# Patient Record
Sex: Female | Born: 1955
Health system: Southern US, Community
[De-identification: ages and names within clinical notes are randomized; demographics above are authoritative.]

## PROBLEM LIST (undated history)

## (undated) DIAGNOSIS — I82409 Acute embolism and thrombosis of unspecified deep veins of unspecified lower extremity: Secondary | ICD-10-CM

## (undated) DIAGNOSIS — Z87442 Personal history of urinary calculi: Secondary | ICD-10-CM

## (undated) DIAGNOSIS — E7800 Pure hypercholesterolemia, unspecified: Secondary | ICD-10-CM

## (undated) DIAGNOSIS — I10 Essential (primary) hypertension: Secondary | ICD-10-CM

## (undated) DIAGNOSIS — Z9289 Personal history of other medical treatment: Secondary | ICD-10-CM

## (undated) DIAGNOSIS — N289 Disorder of kidney and ureter, unspecified: Secondary | ICD-10-CM

## (undated) DIAGNOSIS — M1990 Unspecified osteoarthritis, unspecified site: Secondary | ICD-10-CM

## (undated) DIAGNOSIS — C801 Malignant (primary) neoplasm, unspecified: Secondary | ICD-10-CM

## (undated) DIAGNOSIS — R7303 Prediabetes: Secondary | ICD-10-CM

## (undated) DIAGNOSIS — F419 Anxiety disorder, unspecified: Secondary | ICD-10-CM

## (undated) DIAGNOSIS — J189 Pneumonia, unspecified organism: Secondary | ICD-10-CM

## (undated) DIAGNOSIS — R519 Headache, unspecified: Secondary | ICD-10-CM

## (undated) HISTORY — PX: ABDOMINAL HYSTERECTOMY: SHX81

## (undated) HISTORY — PX: BREAST LUMPECTOMY: SHX2

## (undated) HISTORY — PX: OTHER SURGICAL HISTORY: SHX169

## (undated) HISTORY — PX: BUNIONECTOMY: SHX129

---

## 2000-11-22 ENCOUNTER — Emergency Department (HOSPITAL_COMMUNITY): Admission: EM | Admit: 2000-11-22 | Discharge: 2000-11-22 | Payer: Self-pay | Admitting: Emergency Medicine

## 2001-01-10 ENCOUNTER — Encounter: Payer: Self-pay | Admitting: Family Medicine

## 2001-01-10 ENCOUNTER — Encounter: Admission: RE | Admit: 2001-01-10 | Discharge: 2001-01-10 | Payer: Self-pay | Admitting: Family Medicine

## 2001-02-10 ENCOUNTER — Ambulatory Visit (HOSPITAL_COMMUNITY): Admission: RE | Admit: 2001-02-10 | Discharge: 2001-02-10 | Payer: Self-pay | Admitting: Internal Medicine

## 2001-02-10 ENCOUNTER — Encounter: Payer: Self-pay | Admitting: Internal Medicine

## 2001-02-12 ENCOUNTER — Ambulatory Visit (HOSPITAL_COMMUNITY): Admission: RE | Admit: 2001-02-12 | Discharge: 2001-02-12 | Payer: Self-pay | Admitting: Internal Medicine

## 2001-02-12 ENCOUNTER — Encounter: Payer: Self-pay | Admitting: Internal Medicine

## 2001-02-19 ENCOUNTER — Other Ambulatory Visit: Admission: RE | Admit: 2001-02-19 | Discharge: 2001-02-19 | Payer: Self-pay | Admitting: Internal Medicine

## 2001-02-19 ENCOUNTER — Encounter (INDEPENDENT_AMBULATORY_CARE_PROVIDER_SITE_OTHER): Payer: Self-pay | Admitting: Specialist

## 2001-02-19 DIAGNOSIS — D126 Benign neoplasm of colon, unspecified: Secondary | ICD-10-CM

## 2001-02-26 ENCOUNTER — Encounter: Payer: Self-pay | Admitting: Urology

## 2001-02-26 ENCOUNTER — Encounter: Admission: RE | Admit: 2001-02-26 | Discharge: 2001-02-26 | Payer: Self-pay | Admitting: Urology

## 2001-04-28 ENCOUNTER — Ambulatory Visit (HOSPITAL_COMMUNITY): Admission: RE | Admit: 2001-04-28 | Discharge: 2001-04-28 | Payer: Self-pay | Admitting: Internal Medicine

## 2001-04-28 ENCOUNTER — Encounter: Payer: Self-pay | Admitting: Internal Medicine

## 2001-08-16 DIAGNOSIS — K529 Noninfective gastroenteritis and colitis, unspecified: Secondary | ICD-10-CM

## 2001-08-16 HISTORY — DX: Noninfective gastroenteritis and colitis, unspecified: K52.9

## 2001-09-06 ENCOUNTER — Encounter: Payer: Self-pay | Admitting: Internal Medicine

## 2001-09-06 ENCOUNTER — Encounter (INDEPENDENT_AMBULATORY_CARE_PROVIDER_SITE_OTHER): Payer: Self-pay

## 2001-09-06 ENCOUNTER — Inpatient Hospital Stay (HOSPITAL_COMMUNITY): Admission: EM | Admit: 2001-09-06 | Discharge: 2001-09-12 | Payer: Self-pay | Admitting: Emergency Medicine

## 2003-12-27 ENCOUNTER — Encounter: Payer: Self-pay | Admitting: Internal Medicine

## 2003-12-27 ENCOUNTER — Encounter (INDEPENDENT_AMBULATORY_CARE_PROVIDER_SITE_OTHER): Payer: Self-pay | Admitting: *Deleted

## 2003-12-27 ENCOUNTER — Ambulatory Visit (HOSPITAL_COMMUNITY): Admission: RE | Admit: 2003-12-27 | Discharge: 2003-12-27 | Payer: Self-pay | Admitting: Internal Medicine

## 2004-05-29 ENCOUNTER — Ambulatory Visit: Payer: Self-pay | Admitting: Internal Medicine

## 2005-11-27 ENCOUNTER — Emergency Department (HOSPITAL_COMMUNITY): Admission: EM | Admit: 2005-11-27 | Discharge: 2005-11-27 | Payer: Self-pay | Admitting: Emergency Medicine

## 2006-05-01 ENCOUNTER — Ambulatory Visit: Payer: Self-pay | Admitting: Pain Medicine

## 2006-05-23 ENCOUNTER — Ambulatory Visit: Payer: Self-pay | Admitting: Pain Medicine

## 2006-06-05 ENCOUNTER — Ambulatory Visit: Payer: Self-pay | Admitting: Pain Medicine

## 2006-07-02 ENCOUNTER — Ambulatory Visit: Payer: Self-pay | Admitting: Pain Medicine

## 2006-07-15 ENCOUNTER — Ambulatory Visit: Payer: Self-pay | Admitting: Pain Medicine

## 2006-11-07 ENCOUNTER — Inpatient Hospital Stay (HOSPITAL_COMMUNITY): Admission: EM | Admit: 2006-11-07 | Discharge: 2006-11-09 | Payer: Self-pay | Admitting: Emergency Medicine

## 2006-12-01 ENCOUNTER — Emergency Department: Payer: Self-pay | Admitting: Emergency Medicine

## 2006-12-18 ENCOUNTER — Emergency Department: Payer: Self-pay | Admitting: Emergency Medicine

## 2007-02-06 ENCOUNTER — Emergency Department: Payer: Self-pay | Admitting: Emergency Medicine

## 2007-04-10 ENCOUNTER — Ambulatory Visit: Payer: Self-pay | Admitting: Internal Medicine

## 2007-04-16 ENCOUNTER — Emergency Department: Payer: Self-pay | Admitting: Emergency Medicine

## 2007-05-12 ENCOUNTER — Ambulatory Visit: Payer: Self-pay | Admitting: Internal Medicine

## 2007-07-15 DIAGNOSIS — F32A Depression, unspecified: Secondary | ICD-10-CM | POA: Insufficient documentation

## 2007-07-15 DIAGNOSIS — R519 Headache, unspecified: Secondary | ICD-10-CM | POA: Insufficient documentation

## 2007-07-15 DIAGNOSIS — F329 Major depressive disorder, single episode, unspecified: Secondary | ICD-10-CM

## 2007-07-15 DIAGNOSIS — R51 Headache: Secondary | ICD-10-CM

## 2007-07-15 DIAGNOSIS — M359 Systemic involvement of connective tissue, unspecified: Secondary | ICD-10-CM | POA: Insufficient documentation

## 2007-07-15 DIAGNOSIS — R634 Abnormal weight loss: Secondary | ICD-10-CM

## 2008-09-16 ENCOUNTER — Observation Stay (HOSPITAL_COMMUNITY): Admission: EM | Admit: 2008-09-16 | Discharge: 2008-09-17 | Payer: Self-pay | Admitting: Cardiovascular Disease

## 2008-09-16 ENCOUNTER — Encounter: Payer: Self-pay | Admitting: Emergency Medicine

## 2008-12-01 ENCOUNTER — Encounter (INDEPENDENT_AMBULATORY_CARE_PROVIDER_SITE_OTHER): Payer: Self-pay | Admitting: *Deleted

## 2009-07-22 ENCOUNTER — Telehealth: Payer: Self-pay | Admitting: Internal Medicine

## 2010-07-18 NOTE — Progress Notes (Signed)
Summary: Schedule Colonoscopy  Phone Note Outgoing Call Call back at [facility] Phone [phone]   Call placed by: Hortense Ramal CMA Duncan Dull),  July 22, 2009 3:38 PM Call placed to: Patient Summary of Call: Patient needs a recall colonoscopy due to his previous history of colitis. I have left a message for patient to call back. Hortense Ramal CMA Duncan Dull)  July 22, 2009 3:43 PM   Follow-up for Phone Call        Left message for patient to call back. Hortense Ramal CMA Duncan Dull)  July 28, 2009 3:46 PM   Additional Follow-up for Phone Call Additional follow up Details #1::        We have not gotten a call back from patient. We will send a letter. Hortense Ramal CMA Duncan Dull)  August 02, 2009 9:04 AM

## 2010-08-26 ENCOUNTER — Ambulatory Visit (INDEPENDENT_AMBULATORY_CARE_PROVIDER_SITE_OTHER): Payer: BC Managed Care – PPO

## 2010-08-26 ENCOUNTER — Inpatient Hospital Stay (INDEPENDENT_AMBULATORY_CARE_PROVIDER_SITE_OTHER)
Admission: RE | Admit: 2010-08-26 | Discharge: 2010-08-26 | Disposition: A | Discharge status: Home / Self Care | Payer: BC Managed Care – PPO | Source: Ambulatory Visit | Attending: Emergency Medicine | Admitting: Emergency Medicine

## 2010-08-26 DIAGNOSIS — J189 Pneumonia, unspecified organism: Secondary | ICD-10-CM

## 2010-09-27 LAB — BASIC METABOLIC PANEL
BUN: 11 mg/dL (ref 6–23)
BUN: 5 mg/dL — ABNORMAL LOW (ref 6–23)
CO2: 25 mEq/L (ref 19–32)
Chloride: 109 mEq/L (ref 96–112)
GFR calc Af Amer: >60 mL/min (ref >60)
GFR calc Af Amer: >60 mL/min (ref >60)
Glucose, Bld: 96 mg/dL (ref 70–99)
Glucose, Bld: 93 mg/dL (ref 70–99)
Potassium: 3.4 mEq/L — ABNORMAL LOW (ref 3.5–5.1)
Sodium: 143 mEq/L (ref 135–145)
Sodium: 142 mEq/L (ref 135–145)

## 2010-09-27 LAB — CBC
HCT: 41.2 % (ref 36.0–46.0)
Hemoglobin: 14 g/dL (ref 12.0–15.0)
MCHC: 34.5 g/dL (ref 30.0–36.0)
Platelets: 173 10*3/uL (ref 150–400)
Platelets: 162 10*3/uL (ref 150–400)
RBC: 3.78 MIL/uL — ABNORMAL LOW (ref 3.87–5.11)
RDW: 12.3 % (ref 11.5–15.5)
WBC: 5.2 10*3/uL (ref 4.0–10.5)

## 2010-09-27 LAB — BRAIN NATRIURETIC PEPTIDE: Pro B Natriuretic peptide (BNP): 52 pg/mL (ref 0.0–100.0)

## 2010-09-27 LAB — CK TOTAL AND CKMB (NOT AT ARMC)
CK, MB: 2 ng/mL (ref 0.3–4.0)
Total CK: 68 U/L (ref 7–177)
Total CK: 97 U/L (ref 7–177)

## 2010-09-27 LAB — TROPONIN I: Troponin I: 0.01 ng/mL (ref 0.00–0.06)

## 2010-09-27 LAB — LIPID PANEL
Cholesterol: 159 mg/dL (ref 0–200)
Total CHOL/HDL Ratio: 4.7 RATIO
Triglycerides: 91 mg/dL (ref <150)
VLDL: 18 mg/dL (ref 0–40)

## 2010-09-27 LAB — DIFFERENTIAL
Basophils Relative: 0 % (ref 0–1)
Basophils Relative: 1 % (ref 0–1)
Eosinophils Relative: 4 % (ref 0–5)
Lymphocytes Relative: 18 % (ref 12–46)
Lymphs Abs: 1.3 10*3/uL (ref 0.7–4.0)
Lymphs Abs: 1.3 10*3/uL (ref 0.7–4.0)
Monocytes Absolute: 0.4 10*3/uL (ref 0.1–1.0)
Neutro Abs: 5.1 10*3/uL (ref 1.7–7.7)

## 2010-09-27 LAB — TSH: TSH: 1.658 u[IU]/mL (ref 0.350–4.500)

## 2010-09-27 LAB — CARDIAC PANEL(CRET KIN+CKTOT+MB+TROPI)
CK, MB: 1.2 ng/mL (ref 0.3–4.0)
Relative Index: INVALID (ref 0.0–2.5)
Total CK: 52 U/L (ref 7–177)
Troponin I: <0.01 ng/mL (ref 0.00–0.06)

## 2010-10-31 NOTE — Assessment & Plan Note (Signed)
[facility]                         GASTROENTEROLOGY OFFICE NOTE   [name], [name]                     MRN:          [id number]  DATE:05/12/2007                            DOB:          Feb 12, 1956    HISTORY:  [name] presents today for followup.  She was evaluated  April 10, 2007, for diarrhea and weight loss.  See that dictation for  details.  Because of a history of collagenous colitis, she was initiated  on Entocort 9 mg daily.  Within one week, she stated her diarrhea had  resolved.  She now described 2 formed bowel movements per day.  She  continues on Entocort 9 mg daily.  She has had about a 4-pound weight  gain.  No other issues or problems.  She has been using Meloxicam for  headaches.  She does not feel this is working.   PHYSICAL EXAMINATION:  GENERAL:  Well-appearing female in no acute  distress.  VITAL SIGNS:  Blood pressure 150/88, heart rate 88, weight 153.4 pounds  (increased 4 pounds).  ABDOMEN:  Not reexamined.   IMPRESSION:  Recent evaluation for  a five month history of diarrhea,  cramping, urgency, and weight loss.  This presumably is due to recurrent  collagenous colitis.  The patient has improved on Entocort.   RECOMMENDATIONS:  1. Taper Entocort to 6 mg daily for 1 month, then 3 mg daily for 1      month if tolerated.  2. Advised to minimize nonsteroidal anti-inflammatory drugs as this      may exacerbate collagenous colitis.  3. Routine GI followup in 6 months unless otherwise clinically      indicated.  4. Resume general medical care with Dr. Dan Humphreys.     [name]. [name], MD  Electronically Signed    JNP/MedQ  DD: 05/12/2007  DT: 05/12/2007  Job #: [id number]   cc:   Yates Decamp

## 2010-10-31 NOTE — Discharge Summary (Signed)
[name], [name]              ACCOUNT NO.:  [id number]   MEDICAL RECORD NO.:  [id number]          PATIENT TYPE:  INP   LOCATION:  5732                         FACILITY:  [facility]   PHYSICIAN:  Wilson Singer, M.D.DATE OF BIRTH:  1956/02/15   DATE OF ADMISSION:  11/07/2006  DATE OF DISCHARGE:  11/09/2006                               DISCHARGE SUMMARY   FINAL DISCHARGE DIAGNOSES:  1. Musculoskeletal chest pain.  2. Depression.   CONDITION ON DISCHARGE:  Stable.   MEDICATIONS ON DISCHARGE:  1. Prozac 20 mg every day.  2. Estradiol 1 mg every day.  3. Ibuprofen 800 mg t.i.d. for at least 5 days.   HISTORY:  This 55 year old lady was admitted with left-sided chest pain  radiating to the left arm.  Please see initial history and physical  examination done by myself.   HOSPITAL PROGRESS:  She was admitted to the telemetry floor and had  serial cardiac enzymes which were negative.  She also underwent a CT of  the anterior chest to look for pulmonary embolism.  There was no  evidence of pulmonary embolism.  However, there were two sub cm left  lower lobe pulmonary nodules and mild bilateral hilar lymphadenopathy.  Both inflammatory and neoplastic etiologies were considered and  recommendation is to have a follow up CT scan in 3 to 6 months to make  sure there is stability.  She did well throughout her hospital stay and  was given ibuprofen 800 mg t.i.d. which actually helped the pain.  On  the day of discharge she was doing well except for a mild headache.   EXAMINATION:  VITAL SIGNS:  Temperature 98.5, blood pressure 115/53,  pulse 68, saturation 945 on room air.  There were no new physical  findings today.   FURTHER DISPOSITION:  I am sending her home on ibuprofen 800 mg t.i.d.  and I have told her to take this at least for 5 days religiously whether  she has pain or not.  She must see her primary care physician in the  next 1 to 2 weeks, and he needs to arrange a repeat CT  scan of the chest  in the next three months to make sure that the 2 left lower lobe  pulmonary nodules remain stable.      Wilson Singer, M.D.  Electronically Signed     NCG/MEDQ  D:  11/09/2006  T:  11/09/2006  Job:  [id number]

## 2010-10-31 NOTE — Assessment & Plan Note (Signed)
[name]                         GASTROENTEROLOGY OFFICE NOTE   Michele, [name]                     MRN:          [id number]  DATE:04/10/2007                            DOB:          18-Nov-1955    REASON FOR CONSULTATION:  Diarrhea and weight loss.   HISTORY:  This is a 55 year old female with a history of collagenous  colitis, migraine headaches and depression. She is referred through the  courtesy of Dr.  Dan Humphreys regarding diarrhea and weight loss. The patient  was last evaluated in this office May 29, 2004 for management of  her collagenous colitis. She was treated with Entocort with good  results. She was lost to followup and has not been seen in three years.  She now reports to me having had problems with headaches in May. She was  tried on different medications without success. In June, she began to  develop diarrhea. She describes 8 or more loose bowel movements per day,  some nocturnal symptoms and some incontinence. She has had about a 15  pound weight loss. No bleeding. Multiple stool studies were obtained and  returned negative. In particular, there was no evidence of fecal  leukocytes, enteric pathogens, ova or parasites or clostridium  difficile. Her CBC and electrolytes as well as albumin have also been  normal. The patient has had some intermittent cramping discomfort  associated with loose stools. She states that her symptoms are  essentially identical to her prior problems with collagenous colitis.   ALLERGIES:  1. PENICILLIN.  2. MORPHINE.   CURRENT MEDICATIONS:  1. Amitriptyline.  2. Zonegran.  3. Estradiol.  4. Fluoxetine.  5. Meloxicam p.r.n.   PHYSICAL EXAMINATION:  Well-appearing female in no acute distress. Blood  pressure 130/80, heart rate 100 and regular. Weight is 149.4 pounds.  HEENT: Sclerae anicteric. Conjunctivae are pink. Oral mucosa intact. No  aphthous ulcerations.  LUNGS:  Are clear.  HEART:  Is regular.  ABDOMEN: Soft without tenderness, mass or hernia.  EXTREMITIES: Are without edema.   IMPRESSION:  Five month history of recurrent diarrhea with cramping and  urgency. Negative stool studies as described. The patient does have a  history of collagenous colitis. Suspect relapse of the same which may  have been brought on by the use of nonsteroidal anti-inflammatory drugs.   RECOMMENDATIONS:  1. Initiate Entocort 9 mg daily.  2. Office followup in four weeks.  3. Ongoing general medical care with Dr.  Dan Humphreys.     [name]. [name], MD  Electronically Signed    JNP/MedQ  DD: 04/10/2007  DT: 04/10/2007  Job #: [id number]   cc:   Yates Decamp

## 2010-10-31 NOTE — H&P (Signed)
[name], [name]              ACCOUNT NO.:  [id number]   MEDICAL RECORD NO.:  [id number]          PATIENT TYPE:  EMS   LOCATION:  MAJO                         FACILITY:  [facility]   PHYSICIAN:  Wilson Singer, M.D.DATE OF BIRTH:  [birth year]   DATE OF ADMISSION:  11/07/2006  DATE OF DISCHARGE:                              HISTORY & PHYSICAL   Patient is unassigned.   HISTORY:  This is a 55 year old lady who is a smoker who presents with  left-sided chest pain radiating to left the arm and through to her left  back which has been present today intermittently.  She is vague about  the period of duration of the pain, but she says that it makes her short  of breath.  There is no hemoptysis.  There is no diaphoresis.  She has  had no fever or a cough recently.  She does not appear to have a viral-  type of illness recently.  She has no previous history of coronary  artery disease.  There is no swelling of any of her legs.   PAST MEDICAL HISTORY:  Significant for depression.   PAST SURGICAL HISTORY:  Hysterectomy in 1998 for possible fibroids,  excision of right breast cyst which was benign.   SOCIAL HISTORY:  She is a widowed lady who lives alone.  She smokes one-  and-a-half packet of cigarettes per day.  She occasionally drinks  alcohol.  She works as a Conservation officer, nature at [employer].   MEDICATIONS:  1. Prozac, dose unclear.  2. Estradiol, dose unclear.   ALLERGIES:  PENICILLINS.   FAMILY HISTORY:  There is no family history of early coronary artery  disease.   REVIEW OF SYSTEMS:  Apart from the symptoms mentioned above, there are  no other symptoms referable to all systems reviewed.   PHYSICAL EXAMINATION:  VITAL SIGNS:  Temperature 97.1, blood pressure  162/86, pulse 72, respiratory rate 16, saturation 100% on room air.  CARDIOVASCULAR EXAMINATION:  Heart sounds are present and normal without  gallop rhythm.  There are no murmurs.  Jugular venous pressure is not  raised.  There  are no carotid bruits.  RESPIRATORY:  Lung fields are clear.  There is no pleural rub.  There is  no chest wall tenderness that reproduces her pain.  ABDOMEN:  Soft, nontender with no hepatosplenomegaly.  NEUROLOGICAL:  She is alert and oriented and with no focal neurologic  signs.  MUSCULOSKELETAL:  No obvious left shoulder problems identified.   INVESTIGATIONS:  Sodium 139, potassium 3.6, BUN 9, creatinine 0.7,  glucose 80, hemoglobin 14.6.  Troponin first set less than 0.05.   Electrocardiogram done in the emergency room shows normal sinus rhythm  and is within normal limits without any acute ST-T wave changes.   Chest x-ray is within normal limits.   IMPRESSION:  1. Left chest pain, etiology unclear.  2. Depression.   PLAN:  1. Admit to telemetry.  2. CT angio of the chest.  3. Serial cardiac enzymes.   Further recommendations will depend on patient's hospital progress.      Wilson Singer, M.D.  Electronically Signed     NCG/MEDQ  D:  11/07/2006  T:  11/07/2006  Job:  [id number]

## 2010-11-03 NOTE — Discharge Summary (Signed)
NAMEJADYNN, [name]              ACCOUNT NO.:  [id number]   MEDICAL RECORD NO.:  [id number]          PATIENT TYPE:  OBV   LOCATION:  2856                         FACILITY:  [facility]   PHYSICIAN:  Ricki Rodriguez, M.D.  DATE OF BIRTH:  September 01, 1955   DATE OF ADMISSION:  09/16/2008  DATE OF DISCHARGE:  09/17/2008                               DISCHARGE SUMMARY   FINAL DIAGNOSES:  1. Native vessel coronary atherosclerosis.  2. Tobacco use disorder.  3. Alcohol use disorder.   DISCHARGE MEDICATIONS:  1. Ibuprofen 200 mg 1 three times daily as needed.  2. Multivitamin 1 daily.  3. Vitamin C 500 mg 1 daily.  4. Aspirin 81 mg daily.  5. Crestor 10 mg 1 in the evening.   DISCHARGE DIET:  Low-sodium, heart-healthy diet.   DISCHARGE ACTIVITY:  The patient to increase activity slowly.   WOUND CARE INSTRUCTIONS:  The patient to notify right groin pain,  swelling, or discharge.   HISTORY:  This 55 year old white female, who presented with substernal  chest pain radiating to back, had similar episodes 2 weeks ago with some  tingling in arm, legs, and the mouth area.  She also has history of  smoking 1.5 pack of cigarettes per day for 38 years and a history of  alcohol intake and possible history of elevated cholesterol levels.   PHYSICAL EXAMINATION:  VITAL SIGNS:  Temperature 98, pulse 85,  respirations 17, blood pressure 145/85, height 5 feet 7 inches, and  weight 150 pounds.  GENERAL:  The patient is well-built, well-nourished female in mild  distress.  HEENT:  The patient is normocephalic, atraumatic with brown eyes.  Pupils equal and reactive to light.  Extraocular movement intact.  NECK:  No JVD noted.  No bruit.  LUNGS:  Clear bilaterally.  HEART:  Normal S1 and S2 with tachycardia.  ABDOMEN:  Soft and nontender.  EXTREMITIES:  No edema, cyanosis, clubbing.  Positive varicose veins.  SKIN:  Warm and dry.  NEUROLOGIC:  The patient moves all four extremities and she is right-  handed.   LABORATORY DATA:  Normal hemoglobin, hematocrit, WBC count, platelet  count.  Near normal electrolytes, BUN, and creatinine.  CK-MB and  Troponin-I were normal.   EKG normal sinus rhythm.   Cardiac catheterization showed minimal coronary artery disease with a  hyperdynamic left ventricular systolic function.   HOSPITAL COURSE:  The patient was admitted to telemetry unit.  She  underwent cardiac catheterization that failed to show any critical  coronary artery disease.  Her medications were adjusted and she was  discharged home in satisfactory condition with followup by me in 1-2  weeks.      Ricki Rodriguez, M.D.  Electronically Signed     ASK/MEDQ  D:  10/27/2008  T:  10/28/2008  Job:  [id number]

## 2010-11-03 NOTE — H&P (Signed)
[facility]  Patient:    [name], [name] Visit Number: [id number] MRN: [id number]          Service Type: MED Location: [phone] Attending Physician:  Mervin Hack Dictated by:   Mike Gip, P.A.C. Admit Date:  09/06/2001   CC:         [name], M.D.   History and Physical  CHIEF COMPLAINT:  Severe watery diarrhea, rectal bleeding, and left-sided abdominal pain.  HISTORY OF PRESENT ILLNESS:  The patient is a pleasant 55 year old white female, primary patient of Dr. Milus Glazier, also known to Dr. Yancey Flemings, who has a history of chronic diarrhea over the past year or so associated with a 20-plus-pound weight loss.  She says she has been averaging six to seven watery bowel movements per day, has not had any severe ongoing abdominal pain, and her appetite has been good.  This diarrhea has not been associated with bleeding, fever, chills, etc.  She has undergone workup with [name] as an outpatient with upper endoscopy last summer which was apparently normal.  We do not have her office record available at the moment.  She also had colonoscopy which was normal per the patient.  CT scan of the abdomen and pelvis in August 2002, was read as negative.  She did have some small renal lesions which were felt to be benign.  A small-bowel follow-through was done in November 2002, and was also unremarkable.  She was started on Prozac empirically for possible diarrheal predominant IBS.  She had had stool cultures done which were negative.  The patient had had no antibiotic exposure, no travel, no family history of GI disease, inflammatory bowel disease, etc.  At this time she presents with acute worsening of her diarrhea over the past 48 hours associated with several episodes of bright red blood per rectum. This was also associated with left lower quadrant abdominal pain, worse with bowel movements.  She had called Dr. [name], who  initially sent Anusol suppositories, but her abdominal pain and diarrhea continued.  The patient called back feeling bad in general and was told to come to the emergency room for evaluation.  She was seen, evaluated, and admitted with probable acute exacerbation of colitis, type unclear.  CURRENT MEDICATIONS: 1. Prozac 20 mg p.o. q.d. 2. Imodium 3 p.o. t.i.d.  ALLERGIES:  MORPHINE, which causes itching.  PENICILLIN, had a major reaction, apparently, as a child.  FLAGYL, which caused hives.  SHELLFISH, which causes nausea and vomiting.  PAST MEDICAL HISTORY: 1. C-section x 1. 2. TAH. 3. Removal of breast cyst.  FAMILY HISTORY:  Negative for GI disease.  Pertinent for cerebrovascular disease and MI in her father.  SOCIAL HISTORY:  The patient is employed as a Conservation officer, nature for [employer].  She has three children, ages 20 and 67.  She is divorced and remarried.  Is undergoing a child support trial with her former husband, which has been quite stressful. She is a smoker of one pack per day.  No ETOH.  REVIEW OF SYSTEMS:  CARDIOVASCULAR:  Denies any chest pain or anginal symptoms.  PULMONARY:  Negative for cough, shortness of breath, or sputum production.  GENITOURINARY:  Negative for dysuria, urgency, or frequency. GASTROINTESTINAL:  As above.  MUSCULOSKELETAL:  The patient says that she has noticed ankle swelling with standing over the past couple of weeks which is new.  LABORATORY DATA:  On admission TSH was 1.9.  Sedimentation rate 16. Urinalysis negative.  Amylase  54.  Electrolytes within normal limits.  BUN 6, creatinine 0.7, glucose 91, albumin 2.9.  LFTs within normal limits.  Protime 13.1, INR 1.0.  White count 8.0, hemoglobin 11.1, hematocrit 33.3, MCV 77.9, platelets 326.  PHYSICAL EXAMINATION:  GENERAL:  Well-developed, thin, chronically ill-appearing white female in no acute distress.  She is tearful.  VITAL SIGNS:  Temperature 97, pulse 108, blood pressure 119/76,  respirations 20.  HEENT:  Atraumatic, normocephalic.  PERRLA.  Sclerae anicteric.  NECK:  Supple.  Without nodes.  CARDIOVASCULAR:  Tachycardia.  Regular rhythm, with S1, S2.  LUNGS:  Clear to A&P.  ABDOMEN:  Soft, nondistended.  She has normoactive bowel sounds.  She is very tender in the left mid quadrant and left lower quadrant.  No guarding or rebound.  No CVA tenderness.  No palpable hepatosplenomegaly.  RECTAL:  No stool in the rectal vault.  Mucous was heme-negative.  IMPRESSION: 1. A 55 year old white female with chronic diarrhea and weight loss, now with    exacerbation associated with hematochezia with prior negative GI workup as    an outpatient, but patient showing clinical evidence of failure to thrive    with hypoalbuminemia, weight loss, microcytic mild anemia, and ketones in    her urine.  Rule out inflammatory bowel disease.  Rule out ischemic    colitis.  Rule out other malabsorption illness, i.e., celiac disease. 2. Anemia, microcytic, etiology unclear. 3. Smoker. 4. Status post cesarean section and total abdominal hysterectomy.  PLAN:  The patient is admitted to the service of [name] for IV fluid hydration, bowel rest.  Will review her office records.  Check CT scan of the abdomen and pelvis on the day of admission.  Stool cultures.  May need colonoscopy or flexible sigmoidoscopy pending results of above.  Will also check gluten profile.  For details please see the orders. Dictated by:   Mike Gip, P.A.C. Attending Physician:  Mervin Hack DD:  09/07/01 TD:  09/08/01 Job: [id number]

## 2010-11-03 NOTE — Discharge Summary (Signed)
[facility]  Patient:    [name], [name] Visit Number: [id number] MRN: [id number]          Service Type: MED Location: [phone] Attending Physician:  Mervin Hack Dictated by:   Mike Gip, P.A.C. Admit Date:  09/06/2001 Discharge Date: 09/12/2001   CC:         [name], M.D.   Discharge Summary  ADMISSION DIAGNOSES: 56. A 55 year old white female with chronic diarrhea and weight loss, now with    exacerbation, associated with hematochezia with prior negative    gastrointestinal workup as outpatient but with patient showing clinical    evidence of failure to thrive with hyperalbuminemia, weight loss,    microcytic mild anemia, and ketonuria.  Rule out irritable bowel syndrome,    rule out ischemic colitis, rule out malabsorptive ______ celiac disease,    etc. 2. Anemia, microcytic, etiology unclear. 3. Smoker. 4. Status post cesarean section and total abdominal hysterectomy.  DISCHARGE DIAGNOSES: 1. Segmental colitis consistent with Crohns disease. 2. Microcytic anemia secondary to above, iron deficient. 3. Weight loss and failure to thrive secondary to above. 38. A 54 year old white female with chronic diarrhea and weight loss, now with    exacerbation, associated with hematochezia with prior negative    gastrointestinal workup as outpatient but with patient showing clinical    evidence of failure to thrive with hyperalbuminemia, weight loss,    microcytic mild anemia, and ketonuria.  Rule out irritable bowel syndrome,    rule out ischemic colitis, rule out malabsorptive ______ celiac disease,    etc. 5. Anemia, microcytic, etiology unclear. 6. Smoker. 7. Status post cesarean section and total abdominal hysterectomy.  CONSULTATIONS:  None.  PROCEDURES:  CT scan of the abdomen and pelvis and colonoscopy with biopsy per [name].  BRIEF HISTORY:  [name] is a pleasant 55 year old white female,  primary patient of [name], M.D., also known to [name]. [name], M.D., with history of chronic diarrhea over the past year or so which has been associated with a 20+-pound weight loss.  The patient relates averaging six to seven watery bowel movements per day.  She has not had any severe ongoing abdominal pain, and her appetite has been good.  She says the diarrhea has not been associated with bleeding, fever, chills, etc., over the past year.  She has undergone rather extensive workup with [name] as an outpatient including upper endoscopy which was apparently normal and colonoscopy with intubation of the terminal ileum which was also normal.  CT scan of the abdomen and pelvis done in August 2002 was read as negative with the exception of benign-appearing small renal lesions.  Small-bowel follow-through was done in November 2002 which was unremarkable.  She had been started on Prozac empirically for possible diarrhea predominant IBS.  She also has had stool cultures, and these were negative.  At this time, she presents with acute worsening of diarrhea over the past 48 hours associated with several episodes of bright red blood per rectum.  This has also been associated with left lower quadrant abdominal pain, worse with bowel movements.  She had called Dr. [name], initially was sent Anusol suppositories, but her pain and diarrhea continued, and she was advised to come to the emergency room and is now admitted with probable acute exacerbation of colitis, type unclear.  LABORATORY DATA:  On admission on March 22, WBC 8.0, hemoglobin 11.1, hematocrit 33.3, MCV 77.9, platelets 326.  Followup on March  25, WBC 3.3, hemoglobin 9.4, hematocrit 29.0, MCV 77.  Stool for occult blood was positive.  Sed rate 16.  Pro time 13.6, INR 1.0, PTT 24.  Electrolytes within normal limits.  Glucose 91, BUN 6, creatinine 0.7, albumin 2.9.  Liver function studies normal.  Amylase 54.  TSH 1.92.  Serum  iron low at 39, TIBC 307, and iron saturation low at 13.  Urinalysis negative with the exception of trace ketones.  Stool for C. difficile negative.  Stool cultures negative.  Stool for O & P negative.  The patient had a CT scan of the abdomen and pelvis done on admission.  Her report is not found in the chart at the time of this dictation; however, by our notes, she was found to have thickening of the left colon consistent with an inflammatory process, nonspecific.  HOSPITAL COURSE:  The patient was admitted to the service of [name] who was covering on call.  The patient was initially started on IV fluids. Baseline labs were obtained as were stool cultures.  She was started on Librax, and CT scan of the abdomen and pelvis was ordered with findings as outlined above.  The CT scan suggested thickening of the left colon consistent with an inflammatory process, and she was placed on IV Cipro, and plans were made for colonoscopy.  She underwent colonoscopy with [name] on September 08, 2001, which did show a colitis involving the right colon and transverse colon most consistent with Crohns.  Biopsies were taken.  She was started on Asacol, and then started on IV Solu-Medrol.  Biopsies returned consistent with chronic active colitis, consistent with Crohns disease.  The patient had clinical improvement with the above measures.  Her diet was gradually advanced to a full liquid and then low-residue diet which she tolerated without difficulty.  She was continued on Solu-Medrol, Asacol, Robinul, and we discontinued Cipro at the time of discharge.  By March 28, she was feeling well enough for discharge to home with improvement in her diarrhea and abdominal cramping.  She was discharged with instructions to follow up with [name] in the office on Tuesday, April 15, and to call for any problems in the interim.  She was to maintain a low-residue diet with minimal dairy  products.   DISCHARGE MEDICATIONS: 1. Prednisone 40 mg p.o. q.d. 2. Asacol 400 mg 3 p.o. t.i.d. 3. Robinul Forte 2 mg b.i.d. 4. Multivitamins 1 daily. 5. Prozac 20 mg q.d. 6. Iron supplement 325 mg q.d.  CONDITION UPON DISCHARGE:  Stable and improved. Dictated by:   Mike Gip, P.A.C. Attending Physician:  Mervin Hack DD:  09/18/01 TD:  09/19/01 Job: [id number]

## 2011-12-31 ENCOUNTER — Encounter: Payer: Self-pay | Admitting: Internal Medicine

## 2012-05-13 ENCOUNTER — Other Ambulatory Visit: Payer: Self-pay | Admitting: Radiology

## 2012-08-02 ENCOUNTER — Encounter (HOSPITAL_COMMUNITY): Payer: Self-pay | Admitting: Emergency Medicine

## 2012-08-02 ENCOUNTER — Emergency Department (HOSPITAL_COMMUNITY)
Admission: EM | Admit: 2012-08-02 | Discharge: 2012-08-02 | Disposition: A | Discharge status: Home / Self Care | Payer: BC Managed Care – PPO | Source: Home / Self Care | Attending: Family Medicine | Admitting: Family Medicine

## 2012-08-02 DIAGNOSIS — E860 Dehydration: Secondary | ICD-10-CM

## 2012-08-02 DIAGNOSIS — R197 Diarrhea, unspecified: Secondary | ICD-10-CM

## 2012-08-02 HISTORY — DX: Essential (primary) hypertension: I10

## 2012-08-02 LAB — POCT I-STAT, CHEM 8
Creatinine, Ser: 0.9 mg/dL (ref 0.50–1.10)
Glucose, Bld: 97 mg/dL (ref 70–99)
Hemoglobin: 16.7 g/dL — ABNORMAL HIGH (ref 12.0–15.0)
Sodium: 142 mEq/L (ref 135–145)
TCO2: 25 mmol/L (ref 0–100)

## 2012-08-02 LAB — POCT URINALYSIS DIP (DEVICE)
Protein, ur: 30 mg/dL — AB
Specific Gravity, Urine: 1.025 (ref 1.005–1.030)
Urobilinogen, UA: 1 mg/dL (ref 0.0–1.0)

## 2012-08-02 MED ORDER — DICYCLOMINE HCL 20 MG PO TABS: 20.0000 mg | ORAL_TABLET | Freq: Two times a day (BID) | ORAL | Status: DC

## 2012-08-02 MED ORDER — SODIUM CHLORIDE 0.9 % IV BOLUS (SEPSIS): 1000.0000 mL | Freq: Once | INTRAVENOUS | Status: DC

## 2012-08-02 MED ORDER — BISMUTH 262 MG PO CHEW: CHEWABLE_TABLET | ORAL | Status: DC

## 2012-08-02 MED ORDER — LOPERAMIDE HCL 2 MG PO CAPS: 2.0000 mg | ORAL_CAPSULE | Freq: Four times a day (QID) | ORAL | Status: DC | PRN

## 2012-08-02 NOTE — ED Notes (Signed)
Pt c/o diarrhea x 3 days with stomach and abdominal cramping. Nausea. Pt has used imodium with some relief. \ Pt denies any other symptoms.

## 2012-08-02 NOTE — ED Provider Notes (Signed)
History     CSN: [id number]  Arrival date & time 08/02/12  1227   First MD Initiated Contact with Patient 08/02/12 1307      Chief Complaint  Patient presents with  . Diarrhea    x 3 days.     (Consider location/radiation/quality/duration/timing/severity/associated sxs/prior treatment) HPI Comments: 57 year old female smoker with past medical history of mood disorder and chronic diarrhea in the past with a GI diagnosis of collagenous colitis. Patient states she has not had any diarrhea or colitis symptoms in several years. She comes complaining of 3 days with persistent recurrent watery diarrhea without blood or mucus. She reported her symptoms are associated with intermittent cramping just before bowel movements. Denies nausea or vomiting. Denies jaundice. Denies fever or chills. No known sick contacts. Patient reports filling week and dizzy. Feels hungry but everything she eats "comes right out". She has been using Imodium and her diarrhea has been improved today patient reports that she was having more than 15 episodes of watery diarrhea a day. She does reports low back pain. Denies dysuria or hematuria.   Past Medical History  Diagnosis Date  . Hypertension     Past Surgical History  Procedure Laterality Date  . Abdominal hysterectomy      History reviewed. No pertinent family history.  History  Substance Use Topics  . Smoking status: Current Every Day Smoker -- 1.00 packs/day    Types: Cigarettes  . Smokeless tobacco: Not on file  . Alcohol Use: Yes     Comment: occasional    OB History   Grav Para Term Preterm Abortions TAB SAB Ect Mult Living                  Review of Systems  Constitutional: Positive for appetite change. Negative for fever and chills.  HENT: Negative for congestion, sore throat and rhinorrhea.   Eyes:       No yellow discoloration  Respiratory: Negative for cough and shortness of breath.   Gastrointestinal: Positive for abdominal pain and  diarrhea. Negative for nausea, vomiting, constipation, blood in stool, anal bleeding and rectal pain.  Endocrine: Negative for cold intolerance, heat intolerance, polydipsia, polyphagia and polyuria.  Genitourinary: Negative for dysuria, frequency and pelvic pain.  Musculoskeletal: Negative for joint swelling and arthralgias.  Skin: Negative for rash.  Allergic/Immunologic: Negative for environmental allergies, food allergies and immunocompromised state.  Neurological: Positive for dizziness. Negative for headaches.  All other systems reviewed and are negative.    Allergies  Morphine and related  Home Medications   Current Outpatient Rx  Name  Route  Sig  Dispense  Refill  . Bismuth 262 MG CHEW      2 tabs by mouth every 30-30 minutes as needed for diarrhea up to 8 doses in 24 hours.   56 each   0   . dicyclomine (BENTYL) 20 MG tablet   Oral   Take 1 tablet (20 mg total) by mouth 2 (two) times daily.   20 tablet   0   . loperamide (IMODIUM) 2 MG capsule   Oral   Take 1 capsule (2 mg total) by mouth 4 (four) times daily as needed for diarrhea or loose stools.   12 capsule   0     BP 138/90  Pulse 86  Temp(Src) 98.1 F (36.7 C) (Oral)  Resp 17  SpO2 100%  Physical Exam  Nursing note and vitals reviewed. Constitutional: She is oriented to person, place, and time. She appears  well-developed and well-nourished. No distress.  HENT:  Head: Normocephalic and atraumatic.  Mouth/Throat: No oropharyngeal exudate.  Dry mucus membranes  Eyes: Conjunctivae and EOM are normal. Pupils are equal, round, and reactive to light. No scleral icterus.  Neck: Neck supple. No thyromegaly present.  Cardiovascular: Normal rate, regular rhythm and normal heart sounds.  Exam reveals no gallop and no friction rub.   No murmur heard. Pulmonary/Chest: Effort normal and breath sounds normal.  Abdominal: Soft. Bowel sounds are normal. She exhibits no distension and no mass. There is no  tenderness. There is no rebound and no guarding.  Lymphadenopathy:    She has no cervical adenopathy.  Neurological: She is alert and oriented to person, place, and time.  Skin: No rash noted. She is not diaphoretic.    ED Course  Procedures (including critical care time)  Labs Reviewed  POCT URINALYSIS DIP (DEVICE) - Abnormal; Notable for the following:    Bilirubin Urine SMALL (*)    Ketones, ur TRACE (*)    Protein, ur 30 (*)    All other components within normal limits  POCT I-STAT, CHEM 8 - Abnormal; Notable for the following:    Calcium, Ion 1.26 (*)    Hemoglobin 16.7 (*)    HCT 49.0 (*)    All other components within normal limits   No results found.   1. Diarrhea       MDM  Possible viral enteritis versus exacerbation of collagenous colitis. Electrolytes stable and minimally orthostatic. Mild dehydration clinically. Was treated with 1 L of normal saline IV. Prescribed bismuth, dicyclomine and loperamide. Asked to followup with her GI specialist. Supportive care and red flags that should prompt her return to medical attention discussed with patient and provided in writing.        Sharin Grave, MD 08/04/12 [phone]

## 2012-08-02 NOTE — Discharge Instructions (Signed)
It is possible you have exacerbation of your chronic collagenous colitis. Take the prescribed medications as instructed and followup with your GI specialist as needed. Go to the emergency department if worsening symptoms despite following treatment.

## 2013-10-15 ENCOUNTER — Encounter (HOSPITAL_COMMUNITY): Payer: Self-pay | Admitting: Emergency Medicine

## 2013-10-15 ENCOUNTER — Emergency Department (HOSPITAL_COMMUNITY)
Admission: EM | Admit: 2013-10-15 | Discharge: 2013-10-16 | Disposition: A | Discharge status: Home / Self Care | Payer: BC Managed Care – PPO | Attending: Emergency Medicine | Admitting: Emergency Medicine

## 2013-10-15 ENCOUNTER — Emergency Department (HOSPITAL_COMMUNITY): Payer: BC Managed Care – PPO

## 2013-10-15 DIAGNOSIS — Z90710 Acquired absence of both cervix and uterus: Secondary | ICD-10-CM | POA: Insufficient documentation

## 2013-10-15 DIAGNOSIS — Z79899 Other long term (current) drug therapy: Secondary | ICD-10-CM | POA: Insufficient documentation

## 2013-10-15 DIAGNOSIS — F17200 Nicotine dependence, unspecified, uncomplicated: Secondary | ICD-10-CM | POA: Insufficient documentation

## 2013-10-15 DIAGNOSIS — I10 Essential (primary) hypertension: Secondary | ICD-10-CM | POA: Insufficient documentation

## 2013-10-15 DIAGNOSIS — N200 Calculus of kidney: Secondary | ICD-10-CM

## 2013-10-15 LAB — CBC WITH DIFFERENTIAL/PLATELET
Basophils Absolute: 0 10*3/uL (ref 0.0–0.1)
Basophils Relative: 0 % (ref 0–1)
EOS PCT: 1 % (ref 0–5)
Eosinophils Absolute: 0.1 10*3/uL (ref 0.0–0.7)
HEMATOCRIT: 41.7 % (ref 36.0–46.0)
HEMOGLOBIN: 14.3 g/dL (ref 12.0–15.0)
LYMPHS ABS: 1.2 10*3/uL (ref 0.7–4.0)
LYMPHS PCT: 13 % (ref 12–46)
MCH: 31.3 pg (ref 26.0–34.0)
MCHC: 34.3 g/dL (ref 30.0–36.0)
MCV: 91.2 fL (ref 78.0–100.0)
MONO ABS: 0.5 10*3/uL (ref 0.1–1.0)
MONOS PCT: 6 % (ref 3–12)
NEUTROS ABS: 7.6 10*3/uL (ref 1.7–7.7)
Neutrophils Relative %: 80 % — ABNORMAL HIGH (ref 43–77)
Platelets: 223 10*3/uL (ref 150–400)
RBC: 4.57 MIL/uL (ref 3.87–5.11)
RDW: 12.9 % (ref 11.5–15.5)
WBC: 9.5 10*3/uL (ref 4.0–10.5)

## 2013-10-15 LAB — URINALYSIS, ROUTINE W REFLEX MICROSCOPIC
BILIRUBIN URINE: NEGATIVE
Glucose, UA: NEGATIVE mg/dL
Ketones, ur: NEGATIVE mg/dL
Leukocytes, UA: NEGATIVE
NITRITE: NEGATIVE
PH: 6 (ref 5.0–8.0)
Protein, ur: NEGATIVE mg/dL
SPECIFIC GRAVITY, URINE: 1.024 (ref 1.005–1.030)
Urobilinogen, UA: 0.2 mg/dL (ref 0.0–1.0)

## 2013-10-15 LAB — COMPREHENSIVE METABOLIC PANEL
ALT: 33 U/L (ref 0–35)
AST: 23 U/L (ref 0–37)
Albumin: 4.1 g/dL (ref 3.5–5.2)
Alkaline Phosphatase: 100 U/L (ref 39–117)
BUN: 15 mg/dL (ref 6–23)
CALCIUM: 9.3 mg/dL (ref 8.4–10.5)
CHLORIDE: 100 meq/L (ref 96–112)
CO2: 26 meq/L (ref 19–32)
CREATININE: 0.74 mg/dL (ref 0.50–1.10)
GFR calc Af Amer: >90 mL/min (ref >90)
GFR calc non Af Amer: >90 mL/min (ref >90)
GLUCOSE: 124 mg/dL — AB (ref 70–99)
Potassium: 4 mEq/L (ref 3.7–5.3)
Sodium: 140 mEq/L (ref 137–147)
Total Bilirubin: 0.6 mg/dL (ref 0.3–1.2)
Total Protein: 6.9 g/dL (ref 6.0–8.3)

## 2013-10-15 LAB — URINE MICROSCOPIC-ADD ON

## 2013-10-15 LAB — LIPASE, BLOOD: LIPASE: 45 U/L (ref 11–59)

## 2013-10-15 MED ORDER — ONDANSETRON 4 MG PO TBDP
8.0000 mg | ORAL_TABLET | Freq: Once | ORAL | Status: AC
  Administered 2013-10-15: 8 mg via ORAL
  Filled 2013-10-15: qty 2

## 2013-10-15 MED ORDER — HYDROMORPHONE HCL PF 1 MG/ML IJ SOLN
1.0000 mg | Freq: Once | INTRAMUSCULAR | Status: AC
  Administered 2013-10-15: 1 mg via INTRAVENOUS
  Filled 2013-10-15: qty 1

## 2013-10-15 MED ORDER — PROMETHAZINE HCL 25 MG/ML IJ SOLN
12.5000 mg | Freq: Once | INTRAMUSCULAR | Status: AC
  Administered 2013-10-15: 12.5 mg via INTRAVENOUS
  Filled 2013-10-15: qty 1

## 2013-10-15 NOTE — ED Notes (Signed)
Patient arrives with complaint of right flank pain wrapping around right side and extending into abdomen. Also states intermittent nausea. States history of kidney stones.

## 2013-10-16 ENCOUNTER — Encounter (HOSPITAL_COMMUNITY): Payer: Self-pay | Admitting: Radiology

## 2013-10-16 MED ORDER — HYDROMORPHONE HCL PF 1 MG/ML IJ SOLN
0.5000 mg | Freq: Once | INTRAMUSCULAR | Status: AC
  Administered 2013-10-16: 0.5 mg via INTRAVENOUS
  Filled 2013-10-16: qty 1

## 2013-10-16 MED ORDER — OXYCODONE-ACETAMINOPHEN 5-325 MG PO TABS: 1.0000 | ORAL_TABLET | ORAL | Status: DC | PRN

## 2013-10-16 MED ORDER — ONDANSETRON HCL 4 MG PO TABS: 4.0000 mg | ORAL_TABLET | Freq: Four times a day (QID) | ORAL | Status: DC

## 2013-10-16 NOTE — ED Provider Notes (Signed)
CSN: [id number]     Arrival date & time 10/15/13  1949 History   First MD Initiated Contact with Patient 10/15/13 2131     Chief Complaint  Patient presents with  . Flank Pain  . Nausea     (Consider location/radiation/quality/duration/timing/severity/associated sxs/prior Treatment) Patient is a 58 y.o. female presenting with flank pain. The history is provided by the patient. No language interpreter was used.  Flank Pain This is a new problem. The current episode started today. Associated symptoms include nausea. Pertinent negatives include no chills, fever or vomiting. Associated symptoms comments: Right flank pain that radiates to RLQ abdomen. She has had nausea without vomiting. No fever or diarrhea. She reports history of kidney stones but with different presentation in the past, namely she did not have any nausea or abdominal pain with her first and only stone. .    Past Medical History  Diagnosis Date  . Hypertension    Past Surgical History  Procedure Laterality Date  . Abdominal hysterectomy     History reviewed. No pertinent family history. History  Substance Use Topics  . Smoking status: Current Every Day Smoker -- 1.00 packs/day    Types: Cigarettes  . Smokeless tobacco: Not on file  . Alcohol Use: Yes     Comment: occasional   OB History   Grav Para Term Preterm Abortions TAB SAB Ect Mult Living                 Review of Systems  Constitutional: Negative for fever and chills.  Respiratory: Negative.   Cardiovascular: Negative.   Gastrointestinal: Positive for nausea. Negative for vomiting and diarrhea.  Genitourinary: Positive for flank pain. Negative for hematuria.  Musculoskeletal: Negative.   Skin: Negative.   Neurological: Negative.       Allergies  Morphine and related  Home Medications   Prior to Admission medications   Medication Sig Start Date End Date Taking? Authorizing Provider  acetaminophen (TYLENOL) 325 MG tablet Take 975 mg by  mouth every 6 (six) hours as needed for mild pain.   Yes Historical Provider, MD  aspirin-acetaminophen-caffeine (EXCEDRIN MIGRAINE) [phone] MG per tablet Take 2 tablets by mouth every 6 (six) hours as needed for headache.   Yes Historical Provider, MD  Bismuth 262 MG CHEW 2 tabs by mouth every 30-30 minutes as needed for diarrhea up to 8 doses in 24 hours. 08/02/12  Yes Adlih Moreno-Coll, MD  cyclobenzaprine (FLEXERIL) 10 MG tablet Take 10 mg by mouth 2 (two) times daily as needed (neck pain).   Yes Historical Provider, MD  hydrochlorothiazide (HYDRODIURIL) 25 MG tablet Take 25 mg by mouth daily.   Yes Historical Provider, MD  loperamide (IMODIUM) 2 MG capsule Take 1 capsule (2 mg total) by mouth 4 (four) times daily as needed for diarrhea or loose stools. 08/02/12  Yes Adlih Moreno-Coll, MD   BP 123/54  Pulse 87  Temp(Src) 98 F (36.7 C) (Oral)  Resp 16  SpO2 96% Physical Exam  Constitutional: She is oriented to person, place, and time. She appears well-developed and well-nourished.  HENT:  Head: Normocephalic.  Neck: Normal range of motion. Neck supple.  Cardiovascular: Normal rate and regular rhythm.   Pulmonary/Chest: Effort normal and breath sounds normal.  Abdominal: Soft. Bowel sounds are normal. There is tenderness. There is no rebound and no guarding.  Mild RLQ tenderness.   Genitourinary:  Right CVA tenderness.   Musculoskeletal: Normal range of motion.  Neurological: She is alert and oriented to person,  place, and time.  Skin: Skin is warm and dry. No rash noted.  Psychiatric: She has a normal mood and affect.    ED Course  Procedures (including critical care time) Labs Review Labs Reviewed  CBC WITH DIFFERENTIAL - Abnormal; Notable for the following:    Neutrophils Relative % 80 (*)    All other components within normal limits  COMPREHENSIVE METABOLIC PANEL - Abnormal; Notable for the following:    Glucose, Bld 124 (*)    All other components within normal limits   URINALYSIS, ROUTINE W REFLEX MICROSCOPIC - Abnormal; Notable for the following:    Hgb urine dipstick LARGE (*)    All other components within normal limits  URINE MICROSCOPIC-ADD ON - Abnormal; Notable for the following:    Crystals CA OXALATE CRYSTALS (*)    All other components within normal limits  LIPASE, BLOOD   Results for orders placed during the hospital encounter of 10/15/13  CBC WITH DIFFERENTIAL      Result Value Ref Range   WBC 9.5  4.0 - 10.5 K/uL   RBC 4.57  3.87 - 5.11 MIL/uL   Hemoglobin 14.3  12.0 - 15.0 g/dL   HCT 41.7  36.0 - 46.0 %   MCV 91.2  78.0 - 100.0 fL   MCH 31.3  26.0 - 34.0 pg   MCHC 34.3  30.0 - 36.0 g/dL   RDW 12.9  11.5 - 15.5 %   Platelets 223  150 - 400 K/uL   Neutrophils Relative % 80 (*) 43 - 77 %   Neutro Abs 7.6  1.7 - 7.7 K/uL   Lymphocytes Relative 13  12 - 46 %   Lymphs Abs 1.2  0.7 - 4.0 K/uL   Monocytes Relative 6  3 - 12 %   Monocytes Absolute 0.5  0.1 - 1.0 K/uL   Eosinophils Relative 1  0 - 5 %   Eosinophils Absolute 0.1  0.0 - 0.7 K/uL   Basophils Relative 0  0 - 1 %   Basophils Absolute 0.0  0.0 - 0.1 K/uL  COMPREHENSIVE METABOLIC PANEL      Result Value Ref Range   Sodium 140  137 - 147 mEq/L   Potassium 4.0  3.7 - 5.3 mEq/L   Chloride 100  96 - 112 mEq/L   CO2 26  19 - 32 mEq/L   Glucose, Bld 124 (*) 70 - 99 mg/dL   BUN 15  6 - 23 mg/dL   Creatinine, Ser 0.74  0.50 - 1.10 mg/dL   Calcium 9.3  8.4 - 10.5 mg/dL   Total Protein 6.9  6.0 - 8.3 g/dL   Albumin 4.1  3.5 - 5.2 g/dL   AST 23  0 - 37 U/L   ALT 33  0 - 35 U/L   Alkaline Phosphatase 100  39 - 117 U/L   Total Bilirubin 0.6  0.3 - 1.2 mg/dL   GFR calc non Af Amer >90  >90 mL/min   GFR calc Af Amer >90  >90 mL/min  LIPASE, BLOOD      Result Value Ref Range   Lipase 45  11 - 59 U/L  URINALYSIS, ROUTINE W REFLEX MICROSCOPIC      Result Value Ref Range   Color, Urine YELLOW  YELLOW   APPearance CLEAR  CLEAR   Specific Gravity, Urine 1.024  1.005 - 1.030   pH  6.0  5.0 - 8.0   Glucose, UA NEGATIVE  NEGATIVE mg/dL  Hgb urine dipstick LARGE (*) NEGATIVE   Bilirubin Urine NEGATIVE  NEGATIVE   Ketones, ur NEGATIVE  NEGATIVE mg/dL   Protein, ur NEGATIVE  NEGATIVE mg/dL   Urobilinogen, UA 0.2  0.0 - 1.0 mg/dL   Nitrite NEGATIVE  NEGATIVE   Leukocytes, UA NEGATIVE  NEGATIVE  URINE MICROSCOPIC-ADD ON      Result Value Ref Range   Squamous Epithelial / LPF RARE  RARE   RBC / HPF TOO NUMEROUS TO COUNT  <3 RBC/hpf   Bacteria, UA RARE  RARE   Crystals CA OXALATE CRYSTALS (*) NEGATIVE   Ct Abdomen Pelvis Wo Contrast  10/16/2013   CLINICAL DATA:  Right gland and right anterior lateral abdominal pain, intermittent nausea, history of kidney stones.  EXAM: CT ABDOMEN AND PELVIS WITHOUT CONTRAST  TECHNIQUE: Multidetector CT imaging of the abdomen and pelvis was performed following the standard protocol without intravenous contrast.  COMPARISON:  None.  FINDINGS: There is mild hydronephrosis and hydroureter secondary to a 3 mm diameter stone at the ureteropelvic junction. There are punctate nonobstructing stones elsewhere in the right kidney. The left kidney exhibits no evidence of obstruction. There are punctate collecting system stones. The partially distended urinary bladder is normal in appearance. There are phleboliths within the pelvis. The uterus is surgically absent. There are no adnexal masses nor is there free pelvic fluid.  The liver, gallbladder, pancreas, spleen, partially distended stomach, and abdominal aorta appear normal. There is mild fullness of the adrenal glands bilaterally. The non-opacified loops of small and large bowel exhibit no evidence of ileus nor of obstruction. A normal calibered uninflamed appearing appendix is demonstrated on images 70-75 of series 2. There is no inguinal nor significant umbilical hernia. The lumbar spine and bony pelvis exhibit no acute abnormalities. The lung bases exhibit compressive atelectasis.  IMPRESSION: 1. There is  mild right hydronephrosis secondary to a 3 mm diameter calcified stone in the right ureteropelvic junction. There are nonobstructing stones elsewhere in the right and in the left kidneys which measure 1 mm in diameter. 2. There is no acute hepatobiliary abnormality nor acute bowel abnormality. 3. There is mild enlargement of both adrenal glands without discrete masses.   Electronically Signed   By: David  Martinique   On: 10/16/2013 01:02   Imaging Review No results found.   EKG Interpretation None      MDM   Final diagnoses:  None    1. Kidney stone  Pain controlled in ED.. VSS. 3 mm stone visualized on CT, considered passable size without need for intervention. Can be discharged home and encouraged to follow up with urology.    Dewaine Oats, PA-C 10/16/13 0127

## 2013-10-16 NOTE — ED Notes (Signed)
Patient transported to CT 

## 2013-10-16 NOTE — Discharge Instructions (Signed)
Diet for Kidney Stones Kidney stones are small, hard masses that form inside your kidneys. They are made up of salts and minerals and often form when high levels build up in the urine. The minerals can then start to build up, crystalize, and stick together to form stones. There are several different types of kidney stones. The following types of stones may be influenced by dietary factors:   Calcium Oxalate Stones. An oxalate is a salt found in certain foods. Within the body, calcium can combine with oxalates to form calcium oxalate stones, which can be excreted in the urine in high amounts. This is the most common type of kidney stone.  Calcium Phosphate Stones. These stones may occur when the pH of the urine becomes too high, or less acidic, from too much calcium being excreted in the urine. The pH is a measure of how acidic or basic a substance is.  Uric Acid Stones. This type of stone occurs when the pH of the urine becomes too low, or very acidic, because substances called purines build up in the urine. Purines are found in animal proteins. When the urine is highly concentrated with acid, uric acid kidney stones can form.  Other risk factors for kidney stones include genetics, environment, and being overweight. Your caregiver may ask you to follow specific diet guidelines based on the type of stone you have to lessen the chances of your body making more kidney stones.  GENERAL GUIDELINES FOR ALL TYPES OF STONES  Drink plenty of fluid. Drink 12 16 cups of fluid a day, drinking mainly water.This is the most important thing you can do to prevent the formation of future kidney stones.  Maintain a healthy weight. Your caregiver or dietitian can help you determine what a healthy weight is for you. If you are overweight, weight loss may help prevent the formation of future kidney stones.  Eat a diet adequate in animal protein. Too much animal protein can contribute to the formation of stones. Your  dietitian can help you determine how much protein you should be eating. Avoid low carbohydrate, high protein diets.  Follow a balanced eating approach. The DASH diet, which stands for "Dietary Approaches to Stop Hypertension," is an effective meal plan for reducing stone formation. This diet is high in fruits, vegetables, dairy, and whole grains and low in animal protein. Ask your caregiver or dietitian for information about the DASH diet. ADDITIONAL DIET GUIDELINES FOR CALCIUM STONES Avoid foods high in salt. This includes table salt, salt seasonings, MSG, soy sauce, cured and processed meats, salted crackers and snack foods, fast food, and canned soups and foods. Ask your caregiver or dietitian for information about reducing sodium in your diet or following the low sodium diet.  Ensure adequate calcium intake. Use the following table for calcium guidelines:  Men 42 years old and younger  1000 mg/day.  Men 54 years old and older  1500 mg/day.  Women 93 56 years old  1000 mg/day.  Women 50 years and older  1500 mg/day. Your dietitian can help you determine if you are getting enough calcium in your diet. Foods that are high in calcium include dairy products, broccoli, cheese, yogurt, and pudding. If you need to take a calcium supplement, take it only in the form of calcium citrate.  Avoid foods high in oxalate. Be sure that any supplements you take do not contain more than 500 mg of vitamin C. Vitamin C is converted into oxalate in the body. You do  not need to avoid fruits and vegetables high in vitamin C.   Grains: High-fiber or bran cereal, whole-wheat bread, grits, barley, buckwheat, amaranth, pretzels, and fruitcake.  Vegetables: Dried beans, wax beans, dark leafy greens, eggplant, leeks, okra, parsley, rutabaga, tomato paste, watercress, zucchini, and escarole.  Fruit: Dried apricots, red currants, figs, kiwi, and rhubarb.  Meat and Meat Substitutes: Soybeans and foods made from soy  (soyburger, miso), dried beans, peanut butter.  Milk: Chocolate milk mixes and soymilk.  Fats and Oils: Nuts (peanuts, almonds, pecans, cashews, hazelnuts) and nut butters, sesame seeds, and tDahini paste.  Condiments/Miscellaneous: Chocolate, carob, marmalade, poppy seeds, instant iced tea, and juice from high-oxalate fruits.  Document Released: 09/29/2010 Document Revised: 12/04/2011 Document Reviewed: 11/19/2011 [facility] Patient Information 2014 [place].  Kidney Stones Kidney stones (urolithiasis) are solid masses that form inside your kidneys. The intense pain is caused by the stone moving through the kidney, ureter, bladder, and urethra (urinary tract). When the stone moves, the ureter starts to spasm around the stone. The stone is usually passed in your pee (urine).  HOME CARE  Drink enough fluids to keep your pee clear or pale yellow. This helps to get the stone out.  Strain all pee through the provided strainer. Do not pee without peeing through the strainer, not even once. If you pee the stone out, catch it in the strainer. The stone may be as small as a grain of salt. Take this to your doctor. This will help your doctor figure out what you can do to try to prevent more kidney stones.  Only take medicine as told by your doctor.  Follow up with your doctor as told.  Get follow-up X-rays as told by your doctor. GET HELP IF: You have pain that gets worse even if you have been taking pain medicine. GET HELP RIGHT AWAY IF:   Your pain does not get better with medicine.  You have a fever or shaking chills.  Your pain increases and gets worse over 18 hours.  You have new belly (abdominal) pain.  You feel faint or pass out.  You are unable to pee. MAKE SURE YOU:   Understand these instructions.  Will watch your condition.  Will get help right away if you are not doing well or get worse. Document Released: 11/21/2007 Document Revised: 02/04/2013 Document  Reviewed: 11/05/2012 [facility] Patient Information 2014 [place], [place].

## 2013-10-16 NOTE — ED Notes (Signed)
Pt returned from CT °

## 2013-10-16 NOTE — ED Provider Notes (Signed)
Medical screening examination/treatment/procedure(s) were performed by non-physician practitioner and as supervising physician I was immediately available for consultation/collaboration.   EKG Interpretation None       Kalman Drape, MD 10/16/13 [phone]

## 2013-12-09 ENCOUNTER — Emergency Department (HOSPITAL_COMMUNITY)
Admission: EM | Admit: 2013-12-09 | Discharge: 2013-12-09 | Disposition: A | Discharge status: Home / Self Care | Payer: BC Managed Care – PPO | Attending: Emergency Medicine | Admitting: Emergency Medicine

## 2013-12-09 ENCOUNTER — Emergency Department (HOSPITAL_COMMUNITY): Payer: BC Managed Care – PPO

## 2013-12-09 ENCOUNTER — Encounter (HOSPITAL_COMMUNITY): Payer: Self-pay | Admitting: Emergency Medicine

## 2013-12-09 DIAGNOSIS — I10 Essential (primary) hypertension: Secondary | ICD-10-CM | POA: Insufficient documentation

## 2013-12-09 DIAGNOSIS — N200 Calculus of kidney: Secondary | ICD-10-CM | POA: Insufficient documentation

## 2013-12-09 DIAGNOSIS — R110 Nausea: Secondary | ICD-10-CM | POA: Insufficient documentation

## 2013-12-09 DIAGNOSIS — Z79899 Other long term (current) drug therapy: Secondary | ICD-10-CM | POA: Insufficient documentation

## 2013-12-09 DIAGNOSIS — R197 Diarrhea, unspecified: Secondary | ICD-10-CM | POA: Insufficient documentation

## 2013-12-09 DIAGNOSIS — Z880 Allergy status to penicillin: Secondary | ICD-10-CM | POA: Insufficient documentation

## 2013-12-09 DIAGNOSIS — F17200 Nicotine dependence, unspecified, uncomplicated: Secondary | ICD-10-CM | POA: Insufficient documentation

## 2013-12-09 DIAGNOSIS — Z791 Long term (current) use of non-steroidal anti-inflammatories (NSAID): Secondary | ICD-10-CM | POA: Insufficient documentation

## 2013-12-09 LAB — LIPASE, BLOOD: Lipase: 29 U/L (ref 11–59)

## 2013-12-09 LAB — URINALYSIS, ROUTINE W REFLEX MICROSCOPIC
GLUCOSE, UA: NEGATIVE mg/dL
Ketones, ur: 15 mg/dL — AB
Nitrite: NEGATIVE
PH: 5.5 (ref 5.0–8.0)
Protein, ur: 30 mg/dL — AB
SPECIFIC GRAVITY, URINE: 1.025 (ref 1.005–1.030)
Urobilinogen, UA: 0.2 mg/dL (ref 0.0–1.0)

## 2013-12-09 LAB — COMPREHENSIVE METABOLIC PANEL
ALBUMIN: 4.6 g/dL (ref 3.5–5.2)
ALK PHOS: 114 U/L (ref 39–117)
ALT: 20 U/L (ref 0–35)
AST: 25 U/L (ref 0–37)
BUN: 13 mg/dL (ref 6–23)
CALCIUM: 9.8 mg/dL (ref 8.4–10.5)
CO2: 23 mEq/L (ref 19–32)
Chloride: 100 mEq/L (ref 96–112)
Creatinine, Ser: 0.65 mg/dL (ref 0.50–1.10)
GFR calc Af Amer: >90 mL/min (ref >90)
GFR calc non Af Amer: >90 mL/min (ref >90)
GLUCOSE: 111 mg/dL — AB (ref 70–99)
POTASSIUM: 3.8 meq/L (ref 3.7–5.3)
SODIUM: 140 meq/L (ref 137–147)
TOTAL PROTEIN: 7.6 g/dL (ref 6.0–8.3)
Total Bilirubin: 1 mg/dL (ref 0.3–1.2)

## 2013-12-09 LAB — CBC WITH DIFFERENTIAL/PLATELET
BASOS ABS: 0 10*3/uL (ref 0.0–0.1)
BASOS PCT: 0 % (ref 0–1)
EOS ABS: 0.1 10*3/uL (ref 0.0–0.7)
Eosinophils Relative: 1 % (ref 0–5)
HCT: 43.7 % (ref 36.0–46.0)
Hemoglobin: 14.9 g/dL (ref 12.0–15.0)
LYMPHS ABS: 1.1 10*3/uL (ref 0.7–4.0)
Lymphocytes Relative: 13 % (ref 12–46)
MCH: 31.2 pg (ref 26.0–34.0)
MCHC: 34.1 g/dL (ref 30.0–36.0)
MCV: 91.6 fL (ref 78.0–100.0)
Monocytes Absolute: 0.3 10*3/uL (ref 0.1–1.0)
Monocytes Relative: 4 % (ref 3–12)
NEUTROS PCT: 82 % — AB (ref 43–77)
Neutro Abs: 6.6 10*3/uL (ref 1.7–7.7)
PLATELETS: 242 10*3/uL (ref 150–400)
RBC: 4.77 MIL/uL (ref 3.87–5.11)
RDW: 12.8 % (ref 11.5–15.5)
WBC: 8.1 10*3/uL (ref 4.0–10.5)

## 2013-12-09 LAB — URINE MICROSCOPIC-ADD ON

## 2013-12-09 MED ORDER — ONDANSETRON HCL 4 MG/2ML IJ SOLN
4.0000 mg | Freq: Once | INTRAMUSCULAR | Status: AC
  Administered 2013-12-09: 4 mg via INTRAVENOUS
  Filled 2013-12-09: qty 2

## 2013-12-09 MED ORDER — IBUPROFEN 800 MG PO TABS: 800.0000 mg | ORAL_TABLET | Freq: Three times a day (TID) | ORAL | Status: DC

## 2013-12-09 MED ORDER — HYDROMORPHONE HCL PF 1 MG/ML IJ SOLN
1.0000 mg | Freq: Once | INTRAMUSCULAR | Status: AC
  Administered 2013-12-09: 1 mg via INTRAVENOUS
  Filled 2013-12-09: qty 1

## 2013-12-09 MED ORDER — FENTANYL CITRATE 0.05 MG/ML IJ SOLN
50.0000 ug | Freq: Once | INTRAMUSCULAR | Status: AC
  Administered 2013-12-09: 50 ug via INTRAVENOUS
  Filled 2013-12-09: qty 2

## 2013-12-09 MED ORDER — HYDROMORPHONE HCL PF 1 MG/ML IJ SOLN
1.0000 mg | INTRAMUSCULAR | Status: DC | PRN
  Administered 2013-12-09 (×2): 1 mg via INTRAVENOUS
  Filled 2013-12-09 (×3): qty 1

## 2013-12-09 MED ORDER — TAMSULOSIN HCL 0.4 MG PO CAPS: 0.4000 mg | ORAL_CAPSULE | Freq: Every day | ORAL | Status: DC

## 2013-12-09 MED ORDER — OXYCODONE HCL 5 MG PO TABS: 5.0000 mg | ORAL_TABLET | ORAL | Status: DC | PRN

## 2013-12-09 MED ORDER — KETOROLAC TROMETHAMINE 30 MG/ML IJ SOLN
30.0000 mg | Freq: Once | INTRAMUSCULAR | Status: AC
  Administered 2013-12-09: 30 mg via INTRAVENOUS
  Filled 2013-12-09: qty 1

## 2013-12-09 MED ORDER — HYDROMORPHONE HCL PF 1 MG/ML IJ SOLN
1.0000 mg | Freq: Once | INTRAMUSCULAR | Status: AC
  Administered 2013-12-09: 1 mg via INTRAVENOUS

## 2013-12-09 NOTE — ED Notes (Signed)
Pt reports right flank pain that radiates to right groin starting appx 2 hours ago. Reports nausea. Hx of kidney stones. Pt tearful in triage.

## 2013-12-09 NOTE — ED Provider Notes (Signed)
CSN: [id number]     Arrival date & time 12/09/13  1353 History   None    Chief Complaint  Patient presents with  . Flank Pain     (Consider location/radiation/quality/duration/timing/severity/associated sxs/prior Treatment) HPI  58 yo F w/ a few hours of worsening right flank pain radiating down to suprapubic area that is sharp in nature and associated with nausea and vomiting. Similar to previous stone but worse. Has had nausea, diarrhea recently. No fevers. Decrease in uop that was brown/red in color.   Past Medical History  Diagnosis Date  . Hypertension    Past Surgical History  Procedure Laterality Date  . Abdominal hysterectomy     No family history on file. History  Substance Use Topics  . Smoking status: Current Every Day Smoker -- 1.00 packs/day    Types: Cigarettes  . Smokeless tobacco: Not on file  . Alcohol Use: Yes     Comment: occasional   OB History   Grav Para Term Preterm Abortions TAB SAB Ect Mult Living                 Review of Systems  Constitutional: Negative for fever, chills, activity change and fatigue.  HENT: Negative for congestion and facial swelling.   Eyes: Negative for discharge and redness.  Respiratory: Negative for cough and shortness of breath.   Cardiovascular: Negative for chest pain and palpitations.  Gastrointestinal: Positive for nausea, abdominal pain and diarrhea. Negative for vomiting and abdominal distention.  Endocrine: Negative for polydipsia and polyuria.  Genitourinary: Positive for flank pain. Negative for dysuria and menstrual problem.  Musculoskeletal: Negative for back pain and joint swelling.  Skin: Negative for color change and wound.  Neurological: Negative for dizziness, light-headedness and headaches.      Allergies  Penicillins; Shellfish-derived products; and Morphine and related  Home Medications   Prior to Admission medications   Medication Sig Start Date End Date Taking? Authorizing Irelynn Schermerhorn   hydrochlorothiazide (HYDRODIURIL) 25 MG tablet Take 25 mg by mouth daily.   Yes Historical Antoine Fiallos, MD  ibuprofen (ADVIL,MOTRIN) 800 MG tablet Take 1 tablet (800 mg total) by mouth 3 (three) times daily. 12/09/13   Merrily Pew, MD  oxyCODONE (ROXICODONE) 5 MG immediate release tablet Take 1-2 tablets (5-10 mg total) by mouth every 4 (four) hours as needed for breakthrough pain. 12/09/13   Merrily Pew, MD  tamsulosin (FLOMAX) 0.4 MG CAPS capsule Take 1 capsule (0.4 mg total) by mouth daily. 12/10/13   Merrily Pew, MD   BP 132/77  Pulse 84  Temp(Src) 98.1 F (36.7 C) (Oral)  Resp 16  Wt 160 lb (72.576 kg)  SpO2 98% Physical Exam  Nursing note and vitals reviewed. Constitutional: She is oriented to person, place, and time. She appears well-developed and well-nourished.  HENT:  Head: Normocephalic and atraumatic.  Eyes: Conjunctivae and EOM are normal. Right eye exhibits no discharge. Left eye exhibits no discharge.  Cardiovascular: Normal rate and regular rhythm.   Pulmonary/Chest: Effort normal and breath sounds normal. No respiratory distress.  Abdominal: Soft. She exhibits no distension. There is tenderness (suprapubic, right flank, epigastric and right cva). There is no rebound.  Musculoskeletal: Normal range of motion. She exhibits no edema and no tenderness.  Neurological: She is alert and oriented to person, place, and time.  Skin: Skin is warm and dry.    ED Course  Procedures (including critical care time) Labs Review Labs Reviewed  CBC WITH DIFFERENTIAL - Abnormal; Notable for the following:  Neutrophils Relative % 82 (*)    All other components within normal limits  COMPREHENSIVE METABOLIC PANEL - Abnormal; Notable for the following:    Glucose, Bld 111 (*)    All other components within normal limits  URINALYSIS, ROUTINE W REFLEX MICROSCOPIC - Abnormal; Notable for the following:    Color, Urine AMBER (*)    APPearance CLOUDY (*)    Hgb urine dipstick LARGE (*)     Bilirubin Urine SMALL (*)    Ketones, ur 15 (*)    Protein, ur 30 (*)    Leukocytes, UA SMALL (*)    All other components within normal limits  URINE MICROSCOPIC-ADD ON - Abnormal; Notable for the following:    Crystals CA OXALATE CRYSTALS (*)    All other components within normal limits  LIPASE, BLOOD    Imaging Review Ct Abdomen Pelvis Wo Contrast  12/09/2013   CLINICAL DATA:  FLANK PAIN  EXAM: CT ABDOMEN AND PELVIS WITHOUT CONTRAST  TECHNIQUE: Multidetector CT imaging of the abdomen and pelvis was performed following the standard protocol without IV contrast.  COMPARISON:  CT abdomen pelvis 10/16/2013  FINDINGS: Minimal atelectasis versus scarring within the lung bases.  Noncontrast evaluation of the liver, spleen, left adrenal is unremarkable. Stable right adrenal adenoma Hounsfield units -15.  Stable mild hydronephrosis secondary to a previously described right ureteral calculus which when compared to previous study has advanced to the distal ureter. The calculus is appreciated just proximal to the ureterovesical junction. There is no evidence to suggest extravasation within the perinephric fat. Stable nonobstructing medullary calculi within the left kidney without evidence of obstructive uropathy.  No evidence of abdominal or pelvic masses, free fluid, loculated fluid collections, nor adenopathy. No evidence of abdominal aortic aneurysm. Atherosclerotic calcifications are appreciated. No abdominal wall nor inguinal hernia.  The bowel is negative. There no aggressive appearing osseous lesions. Mild spondylosis within the lower thoracic spine.  IMPRESSION: Stable mild hydronephrosis on the right and interval advancement of the right ureteral calculus to the distal right ureter.   Electronically Signed   By: Margaree Mackintosh M.D.   On: 12/09/2013 18:37     EKG Interpretation None      MDM   Final diagnoses:  Kidney stone    58 yo F w/ a few hours of worsening right flank pain radiating  down to suprapubic area that is sharp in nature and associted with nausea and vomiting. Similar to previous stone but worse. Has had nausea, diarrhea recently. No fevers. Decrease in uop that was brown/red in color. On exam, hypertensive, HR up, appears to be in pain. Has ttp in right cva, flank, epigastrium and minimally suprapubically.  Concern for complicated stone, but also concern for pancreatitis, gastroenteritis as possible etiologies. Will get CT scan since it doesn't seem 100% clear this is a stone. Will also treat symptomatically.  Patient's symptoms improved, ct w/ stone and some hydro but no obvious blockage. Urine not infected, afebrile, no white count, patient stable for d/c. Patient very upset that she has a stone and the level of pain she is in and concerned it won't ever stop. Also frustrated at the lack of explanations she has had over her two visits here as well. Spent a long time at bedside to help allay her fears, ensure follow up and answer questions. Patient was remarkably more calm and comfortable afterwards and dc in stable condition, will call urologist in AM to set up an appointment.    Sara Lee,  MD 12/10/13 1115

## 2013-12-09 NOTE — ED Notes (Signed)
Pt undressed, in gown, on continuous pulse oximetry and blood pressure cuff; family at bedside 

## 2013-12-10 NOTE — ED Provider Notes (Signed)
Medical screening examination/treatment/procedure(s) were conducted as a shared visit with resident physician and myself.  I personally evaluated the patient during the encounter.   I interviewed and examined the patient. Lungs are CTAB. Cardiac exam wnl. Abdomen soft. Found to have kidney stone. Pain improved. Plan for d/c home.   Blanchard Kelch, MD 12/10/13 [phone]

## 2013-12-13 ENCOUNTER — Emergency Department (HOSPITAL_COMMUNITY)
Admission: EM | Admit: 2013-12-13 | Discharge: 2013-12-13 | Disposition: A | Discharge status: Home / Self Care | Payer: BC Managed Care – PPO | Attending: Emergency Medicine | Admitting: Emergency Medicine

## 2013-12-13 ENCOUNTER — Encounter (HOSPITAL_COMMUNITY): Payer: Self-pay | Admitting: Emergency Medicine

## 2013-12-13 ENCOUNTER — Emergency Department (HOSPITAL_COMMUNITY): Payer: BC Managed Care – PPO

## 2013-12-13 DIAGNOSIS — F17200 Nicotine dependence, unspecified, uncomplicated: Secondary | ICD-10-CM | POA: Insufficient documentation

## 2013-12-13 DIAGNOSIS — Z87448 Personal history of other diseases of urinary system: Secondary | ICD-10-CM | POA: Insufficient documentation

## 2013-12-13 DIAGNOSIS — Z90710 Acquired absence of both cervix and uterus: Secondary | ICD-10-CM | POA: Insufficient documentation

## 2013-12-13 DIAGNOSIS — Z79899 Other long term (current) drug therapy: Secondary | ICD-10-CM | POA: Insufficient documentation

## 2013-12-13 DIAGNOSIS — I10 Essential (primary) hypertension: Secondary | ICD-10-CM | POA: Insufficient documentation

## 2013-12-13 DIAGNOSIS — N200 Calculus of kidney: Secondary | ICD-10-CM

## 2013-12-13 DIAGNOSIS — Z880 Allergy status to penicillin: Secondary | ICD-10-CM | POA: Insufficient documentation

## 2013-12-13 HISTORY — DX: Disorder of kidney and ureter, unspecified: N28.9

## 2013-12-13 LAB — URINALYSIS, ROUTINE W REFLEX MICROSCOPIC
Bilirubin Urine: NEGATIVE
Glucose, UA: NEGATIVE mg/dL
KETONES UR: NEGATIVE mg/dL
NITRITE: NEGATIVE
Protein, ur: NEGATIVE mg/dL
Specific Gravity, Urine: 1.011 (ref 1.005–1.030)
UROBILINOGEN UA: 0.2 mg/dL (ref 0.0–1.0)
pH: 7 (ref 5.0–8.0)

## 2013-12-13 LAB — URINE MICROSCOPIC-ADD ON

## 2013-12-13 LAB — I-STAT CHEM 8, ED
BUN: 13 mg/dL (ref 6–23)
CALCIUM ION: 1.24 mmol/L — AB (ref 1.12–1.23)
Chloride: 102 mEq/L (ref 96–112)
Creatinine, Ser: 1 mg/dL (ref 0.50–1.10)
Glucose, Bld: 94 mg/dL (ref 70–99)
HEMATOCRIT: 43 % (ref 36.0–46.0)
HEMOGLOBIN: 14.6 g/dL (ref 12.0–15.0)
POTASSIUM: 3.5 meq/L — AB (ref 3.7–5.3)
Sodium: 142 mEq/L (ref 137–147)
TCO2: 24 mmol/L (ref 0–100)

## 2013-12-13 MED ORDER — HYDROMORPHONE HCL PF 1 MG/ML IJ SOLN
1.0000 mg | Freq: Once | INTRAMUSCULAR | Status: AC
  Administered 2013-12-13: 1 mg via INTRAVENOUS
  Filled 2013-12-13: qty 1

## 2013-12-13 MED ORDER — ONDANSETRON HCL 4 MG/2ML IJ SOLN
4.0000 mg | Freq: Once | INTRAMUSCULAR | Status: AC
  Administered 2013-12-13: 4 mg via INTRAVENOUS
  Filled 2013-12-13: qty 2

## 2013-12-13 MED ORDER — KETOROLAC TROMETHAMINE 30 MG/ML IJ SOLN
30.0000 mg | Freq: Once | INTRAMUSCULAR | Status: AC
  Administered 2013-12-13: 30 mg via INTRAVENOUS
  Filled 2013-12-13: qty 1

## 2013-12-13 NOTE — ED Notes (Signed)
Pt of floor to xray.

## 2013-12-13 NOTE — ED Notes (Signed)
Bladder scanner showed 525ml, pt urinated 553ml Dr Lenor Derrick notified

## 2013-12-13 NOTE — ED Notes (Signed)
Per pt sts severe right flank pain and groin pain. Recently dx with kidney stone but now she is having trouble voiding. sts severe pain.

## 2013-12-13 NOTE — ED Notes (Signed)
Pt states she came to the ER last Wednesday and was diagnosed with kidney stones.  She c/o pain in the mid lower abdomin and top of groin 10/10.

## 2013-12-13 NOTE — ED Provider Notes (Signed)
CSN: [id number]     Arrival date & time 12/13/13  1436 History   First MD Initiated Contact with Patient 12/13/13 1508     Chief Complaint  Patient presents with  . Flank Pain  . Abdominal Pain     (Consider location/radiation/quality/duration/timing/severity/associated sxs/prior Treatment) HPI Comments: Pt with hx of kidney stone presents with right flank pain.  Feels that she is having a hard time urinating and feel pressure to lower abd and right flank.  Pt was here on 6/24 and dx with a 44mm distal right ureteral stone.  Has contacted [facility] who will see her tomorrow if stone not passed.  Pt has been using oxycodone at home and has been getting relief until today.  No fevers.  No vomiting.  Pain has worsened throughout today.  Patient is a 58 y.o. female presenting with flank pain and abdominal pain.  Flank Pain Associated symptoms include abdominal pain. Pertinent negatives include no chest pain, no headaches and no shortness of breath.  Abdominal Pain Associated symptoms: dysuria and nausea   Associated symptoms: no chest pain, no chills, no cough, no diarrhea, no fatigue, no fever, no hematuria, no shortness of breath and no vomiting     Past Medical History  Diagnosis Date  . Hypertension   . Renal disorder    Past Surgical History  Procedure Laterality Date  . Abdominal hysterectomy     History reviewed. No pertinent family history. History  Substance Use Topics  . Smoking status: Current Every Day Smoker -- 1.00 packs/day    Types: Cigarettes  . Smokeless tobacco: Not on file  . Alcohol Use: Yes     Comment: occasional   OB History   Grav Para Term Preterm Abortions TAB SAB Ect Mult Living                 Review of Systems  Constitutional: Negative for fever, chills, diaphoresis and fatigue.  HENT: Negative for congestion, rhinorrhea and sneezing.   Eyes: Negative.   Respiratory: Negative for cough, chest tightness and shortness of breath.    Cardiovascular: Negative for chest pain and leg swelling.  Gastrointestinal: Positive for nausea and abdominal pain. Negative for vomiting, diarrhea and blood in stool.  Genitourinary: Positive for dysuria and flank pain. Negative for frequency, hematuria and difficulty urinating.  Musculoskeletal: Negative for arthralgias and back pain.  Skin: Negative for rash.  Neurological: Negative for dizziness, speech difficulty, weakness, numbness and headaches.      Allergies  Penicillins; Shellfish-derived products; and Morphine and related  Home Medications   Prior to Admission medications   Medication Sig Start Date End Date Taking? Authorizing Provider  hydrochlorothiazide (HYDRODIURIL) 25 MG tablet Take 25 mg by mouth daily.   Yes Historical Provider, MD  ibuprofen (ADVIL,MOTRIN) 800 MG tablet Take 800 mg by mouth every 8 (eight) hours as needed for mild pain.   Yes Historical Provider, MD  oxyCODONE (OXY IR/ROXICODONE) 5 MG immediate release tablet Take 5-10 mg by mouth every 4 (four) hours as needed for severe pain.   Yes Historical Provider, MD  tamsulosin (FLOMAX) 0.4 MG CAPS capsule Take 0.4 mg by mouth daily after breakfast.   Yes Historical Provider, MD   BP 142/75  Pulse 85  Temp(Src) 98.3 F (36.8 C)  Resp 18  SpO2 97% Physical Exam  Constitutional: She is oriented to person, place, and time. She appears well-developed and well-nourished. She appears distressed.  HENT:  Head: Normocephalic and atraumatic.  Eyes: Pupils are  equal, round, and reactive to light.  Neck: Normal range of motion. Neck supple.  Cardiovascular: Normal rate, regular rhythm and normal heart sounds.   Pulmonary/Chest: Effort normal and breath sounds normal. No respiratory distress. She has no wheezes. She has no rales. She exhibits no tenderness.  Abdominal: Soft. Bowel sounds are normal. There is tenderness (moderate TTP right suprpubic area, right flank). There is no rebound and no guarding.   Musculoskeletal: Normal range of motion. She exhibits no edema.  Lymphadenopathy:    She has no cervical adenopathy.  Neurological: She is alert and oriented to person, place, and time.  Skin: Skin is warm and dry. No rash noted.  Psychiatric: She has a normal mood and affect.    ED Course  Procedures (including critical care time) Labs Review Results for orders placed during the hospital encounter of 12/13/13  URINALYSIS, ROUTINE W REFLEX MICROSCOPIC      Result Value Ref Range   Color, Urine YELLOW  YELLOW   APPearance CLEAR  CLEAR   Specific Gravity, Urine 1.011  1.005 - 1.030   pH 7.0  5.0 - 8.0   Glucose, UA NEGATIVE  NEGATIVE mg/dL   Hgb urine dipstick LARGE (*) NEGATIVE   Bilirubin Urine NEGATIVE  NEGATIVE   Ketones, ur NEGATIVE  NEGATIVE mg/dL   Protein, ur NEGATIVE  NEGATIVE mg/dL   Urobilinogen, UA 0.2  0.0 - 1.0 mg/dL   Nitrite NEGATIVE  NEGATIVE   Leukocytes, UA TRACE (*) NEGATIVE  URINE MICROSCOPIC-ADD ON      Result Value Ref Range   Squamous Epithelial / LPF RARE  RARE   WBC, UA 0-2  <3 WBC/hpf   RBC / HPF 7-10  <3 RBC/hpf  I-STAT CHEM 8, ED      Result Value Ref Range   Sodium 142  137 - 147 mEq/L   Potassium 3.5 (*) 3.7 - 5.3 mEq/L   Chloride 102  96 - 112 mEq/L   BUN 13  6 - 23 mg/dL   Creatinine, Ser 1.00  0.50 - 1.10 mg/dL   Glucose, Bld 94  70 - 99 mg/dL   Calcium, Ion 1.24 (*) 1.12 - 1.23 mmol/L   TCO2 24  0 - 100 mmol/L   Hemoglobin 14.6  12.0 - 15.0 g/dL   HCT 43.0  36.0 - 46.0 %   Ct Abdomen Pelvis Wo Contrast  12/09/2013   CLINICAL DATA:  FLANK PAIN  EXAM: CT ABDOMEN AND PELVIS WITHOUT CONTRAST  TECHNIQUE: Multidetector CT imaging of the abdomen and pelvis was performed following the standard protocol without IV contrast.  COMPARISON:  CT abdomen pelvis 10/16/2013  FINDINGS: Minimal atelectasis versus scarring within the lung bases.  Noncontrast evaluation of the liver, spleen, left adrenal is unremarkable. Stable right adrenal adenoma  Hounsfield units -15.  Stable mild hydronephrosis secondary to a previously described right ureteral calculus which when compared to previous study has advanced to the distal ureter. The calculus is appreciated just proximal to the ureterovesical junction. There is no evidence to suggest extravasation within the perinephric fat. Stable nonobstructing medullary calculi within the left kidney without evidence of obstructive uropathy.  No evidence of abdominal or pelvic masses, free fluid, loculated fluid collections, nor adenopathy. No evidence of abdominal aortic aneurysm. Atherosclerotic calcifications are appreciated. No abdominal wall nor inguinal hernia.  The bowel is negative. There no aggressive appearing osseous lesions. Mild spondylosis within the lower thoracic spine.  IMPRESSION: Stable mild hydronephrosis on the right and interval advancement of the  right ureteral calculus to the distal right ureter.   Electronically Signed   By: Margaree Mackintosh M.D.   On: 12/09/2013 18:37   Dg Abd 1 View  12/13/2013   CLINICAL DATA:  RIGHT flank pain and pelvic pressure.  Kidney stone.  EXAM: ABDOMEN - 1 VIEW  COMPARISON:  12/09/2013 CT.  FINDINGS: Large right stool burden is present. In the region of the distal RIGHT ureter at the UVJ, there is a calcification which appears to correspond with the stone seen on recent CT. This measures 7 mm in the long axis and 3 mm in the short axis. Scattered phleboliths are present in the anatomic pelvis.  IMPRESSION: Distal RIGHT ureteral stone appears to have progressed to the level of the the RIGHT UVJ.   Electronically Signed   By: Dereck Ligas M.D.   On: 12/13/2013 17:22      Imaging Review Dg Abd 1 View  12/13/2013   CLINICAL DATA:  RIGHT flank pain and pelvic pressure.  Kidney stone.  EXAM: ABDOMEN - 1 VIEW  COMPARISON:  12/09/2013 CT.  FINDINGS: Large right stool burden is present. In the region of the distal RIGHT ureter at the UVJ, there is a calcification which  appears to correspond with the stone seen on recent CT. This measures 7 mm in the long axis and 3 mm in the short axis. Scattered phleboliths are present in the anatomic pelvis.  IMPRESSION: Distal RIGHT ureteral stone appears to have progressed to the level of the the RIGHT UVJ.   Electronically Signed   By: Dereck Ligas M.D.   On: 12/13/2013 17:22     EKG Interpretation None      MDM   Final diagnoses:  Kidney stone    Pt with known distal right ureteral stone.  Urine does not show infection, creatinine normal.  Pain controlled.  Pt has urinated well in the ED without signs of retention.  Will f/u with [facility] in the am.  Has pain meds at home.    Malvin Johns, MD 12/13/13 1944

## 2013-12-13 NOTE — Discharge Instructions (Signed)

## 2014-10-04 ENCOUNTER — Ambulatory Visit: Payer: Self-pay

## 2014-10-04 ENCOUNTER — Ambulatory Visit (INDEPENDENT_AMBULATORY_CARE_PROVIDER_SITE_OTHER): Payer: Worker's Compensation | Admitting: Internal Medicine

## 2014-10-04 ENCOUNTER — Telehealth: Payer: Self-pay | Admitting: Urgent Care

## 2014-10-04 VITALS — BP 134/84 | HR 111 | Temp 98.2°F | Resp 17 | Ht 66.0 in | Wt 176.0 lb

## 2014-10-04 DIAGNOSIS — S6991XA Unspecified injury of right wrist, hand and finger(s), initial encounter: Secondary | ICD-10-CM | POA: Diagnosis not present

## 2014-10-04 DIAGNOSIS — M25441 Effusion, right hand: Secondary | ICD-10-CM | POA: Diagnosis not present

## 2014-10-04 DIAGNOSIS — M79641 Pain in right hand: Secondary | ICD-10-CM | POA: Diagnosis not present

## 2014-10-04 MED ORDER — KETOROLAC TROMETHAMINE 60 MG/2ML IM SOLN
60.0000 mg | Freq: Once | INTRAMUSCULAR | Status: AC
  Administered 2014-10-04: 60 mg via INTRAMUSCULAR

## 2014-10-04 MED ORDER — OXYCODONE-ACETAMINOPHEN 5-325 MG PO TABS: 1.0000 | ORAL_TABLET | Freq: Three times a day (TID) | ORAL | Status: DC | PRN

## 2014-10-04 MED ORDER — MELOXICAM 7.5 MG PO TABS: 7.5000 mg | ORAL_TABLET | Freq: Every day | ORAL | Status: DC

## 2014-10-04 NOTE — Patient Instructions (Signed)
-   Please return to our clinic for re-evaluation on 10/08/2014.

## 2014-10-04 NOTE — Telephone Encounter (Signed)
Called patient and reviewed radiologist over-read. Advised patient to avoid use of the right hand as instructed in clinic with particular care of right middle finger given tuft fracture. On x-ray it appeared non-displaced so will hold off on referral to orthopedics. Will re-evaluate on 10/08/2014, consider finger splint as indicated on 10/08/2014.  Jaynee Eagles, PA-C [facility] [phone] 10/04/2014  3:07 PM

## 2014-10-04 NOTE — Progress Notes (Signed)
    MRN: [id number] DOB: [birth year]  Subjective:   [name] is a 59 y.o. female presenting for chief complaint of right hand injury sustained while at work. Patient works at [employer], was closing large front door. Reports that her right hand slipped and the door slammed shut on her right hand at the last second. States that she felt immediate pain and swelling. Pain is sharp, throbbing, constant, non-radiating. She applied ice to her while in our clinic, [facility]. She has not tried any medications for relief. Admits decreased range of motion and strength due to pain. Denies loss of sensation. Denies history of liver or kidney disease. Denies any other aggravating or relieving factors, no other questions or concerns.  [name] is allergic to penicillins; shellfish-derived products; and morphine and related.  [name]   ROS As in subjective.  Objective:   Vitals: BP 134/84 mmHg  Pulse 111  Temp(Src) 98.2 F (36.8 C) (Oral)  Resp 17  Ht 5\' 6"  (1.676 m)  Wt 176 lb (79.833 kg)  BMI 28.42 kg/m2  SpO2 96%  Physical Exam  Constitutional:  Well nourished, well developed. In obvious discomfort with her right hand injury.  Cardiovascular:  Elevated heart rate.  Pulmonary/Chest: Effort normal.  Musculoskeletal:       Right wrist: She exhibits decreased range of motion, tenderness (1st-4th distal phalange up to MCP joint), bony tenderness (1st-4th distal phalange up to MCP joint) and swelling (1st-4th distal phalange up to MCP joint). She exhibits no deformity and no laceration.  Neurological:  Alert and oriented to person, place and time.  Skin: Skin is warm and dry. No rash noted. No erythema. No pallor.  Slight ecchymosis of 2nd - 4th finger at level of PIP up to DIP.   [facility] reading (PRIMARY) by  Dr. Laney Pastor and PA-Chrisopher Pustejovsky. Right Hand, 2nd-4th right fingers: osteophytes in distal 1st, 3rd phalanx, please comment if these represent displaced fracture. Otherwise,  no other acute bony abnormality.  Assessment and Plan :   1. Hand injury, right, initial encounter 2. Right hand pain 3. Finger joint swelling, right - Soft tissue inflammation and swelling secondary to trauma to right hand, provided pain relief with local anaesthesia of 2nd finger for removal of ring and Toradol injection for hand pain relief. - Heart rate elevated likely due to significant pain, provided with script for Percocet and meloxicam for inflammation - Work restrictions reviewed, advised to return to work tomorrow with restriction of no use of right hand. Return to clinic 10/08/2014 for re-evaluation.  Jaynee Eagles, PA-C [facility] [phone] 10/04/2014 9:50 AM   I have participated in the care of this patient with the Advanced Practice Provider and agree with Diagnosis and Plan as documented. Robert P. Laney Pastor, M.D.

## 2014-10-08 ENCOUNTER — Ambulatory Visit (INDEPENDENT_AMBULATORY_CARE_PROVIDER_SITE_OTHER): Payer: Worker's Compensation | Admitting: Family Medicine

## 2014-10-08 ENCOUNTER — Ambulatory Visit: Payer: Self-pay

## 2014-10-08 VITALS — BP 150/90 | HR 87 | Temp 98.1°F | Resp 18 | Ht 66.0 in | Wt 174.2 lb

## 2014-10-08 DIAGNOSIS — S60944D Unspecified superficial injury of right ring finger, subsequent encounter: Secondary | ICD-10-CM

## 2014-10-08 DIAGNOSIS — S62602D Fracture of unspecified phalanx of right middle finger, subsequent encounter for fracture with routine healing: Secondary | ICD-10-CM | POA: Diagnosis not present

## 2014-10-08 DIAGNOSIS — S62639D Displaced fracture of distal phalanx of unspecified finger, subsequent encounter for fracture with routine healing: Secondary | ICD-10-CM

## 2014-10-08 DIAGNOSIS — S6991XD Unspecified injury of right wrist, hand and finger(s), subsequent encounter: Secondary | ICD-10-CM

## 2014-10-08 NOTE — Progress Notes (Signed)
    MRN: [id number] DOB: 08-19-1955  Subjective:   [name] is a 59 y.o. female presenting for chief complaint of Follow-up  Patient was seen on 10/04/2014 for right hand injury, diagnosed with closed tuft fracture of distal right 3rd phalynx. She was stabilized with wrist splint, arm sling and placed on work restrictions of no use of right hand until re-evaluation. Today, patient admits her pain is improved but ongoing in her right middle and ring finger. She has also had ongoing swelling of the same fingers. Her ROM has improved but still has trouble fully using fingers due to her pain and swelling. Denies loss of sensation, erythema, fevers, bleeding, re-injury of right hand and fingers. Has been using meloxicam for relief but not percocet due to adverse effects of drowsiness. Denies any other aggravating or relieving factors, no other questions or concerns.  [name] is currently taking meloxicam and Percocet. She is allergic to penicillins; shellfish-derived products; and morphine and related.  ROS As in subjective.  Objective:   Vitals: BP 150/90 mmHg  Pulse 87  Temp(Src) 98.1 F (36.7 C) (Oral)  Resp 18  Ht 5\' 6"  (1.676 m)  Wt 174 lb 3.2 oz (79.017 kg)  BMI 28.13 kg/m2  SpO2 98%  Physical Exam  Constitutional: She is oriented to person, place, and time and well-developed, well-nourished, and in no distress.  Cardiovascular: Normal rate.   Pulmonary/Chest: Effort normal.  Musculoskeletal:       Right hand: She exhibits decreased range of motion (flexion and abduction), tenderness, bony tenderness and swelling (of 3rd and 4th fingers up to MCP joint). She exhibits normal capillary refill, no deformity and no laceration. Normal sensation noted. Decreased strength (patient unable to perform completely due to pain) noted.  Neurological: She is alert and oriented to person, place, and time.  Skin: Skin is warm and dry. No rash noted. No erythema. No pallor.   [facility] reading  (PRIMARY) by  Dr. Lorelei Pont and PA-Dilyn Smiles. Right 3rd finger: tuft fracture of distal phalynx unchanged from previous x-ray. Right 4th finger: normal x-ray.  Assessment and Plan :   1. Finger injury, right, subsequent encounter 2. Closed fracture of tuft of distal phalanx of finger, with routine healing, subsequent encounter - Stabilized 3rd finger with fold over splint. Patient tolerated this well. Advised that she continue work restriction of not using right hand for 1 more week. Continue meloxicam or switch to ibuprofen for pain and swelling.  Jaynee Eagles, PA-C [facility] [phone] 10/08/2014 1:19 PM

## 2014-10-08 NOTE — Progress Notes (Signed)
Received her x-ray reports:   RIGHT MIDDLE FINGER 2+V  COMPARISON: Right third finger x-ray of October 04, 2014  FINDINGS:  Again demonstrated is a fracture through the radial aspect of the  tuft of the right third finger. There is no significant periosteal  reaction. The proximal and middle phalanges are intact. The  interphalangeal joints are unremarkable.  IMPRESSION:  There is a stable appearance of the fracture through the radial  aspect of the tuft of the right middle finger.   RIGHT RING FINGER 2+V  COMPARISON: October 04, 2014.  FINDINGS:  There appears to be a nondisplaced fracture involving the distal  tuft of the fourth distal phalanx. Joint spaces appear to be intact.  No soft tissue abnormality is noted.  IMPRESSION:  Probable nondisplaced distal tuft fracture of fourth distal phalanx.  These results will be called to the ordering clinician or  representative by the Radiologist Assistant, and communication  documented in the PACS or zVision Dashboard  It appears that she has a fracture of the tuft of the ring finger as well Called to let her know. She will come by clinic tomorrow so a fold-over can be placed on her ring finger as well.  She will let the front desk know that I called and that she needs to have another splint.   I have also examined this pt and agree with note by Mani,PA-C.  She has normal cap refill of her fingers.

## 2014-10-08 NOTE — Patient Instructions (Signed)
-   Please continue to use arm sling while at work. Take it off while you are at home. - Continue to use meloxicam 15mg  for pain and inflammation.

## 2014-10-09 ENCOUNTER — Ambulatory Visit (INDEPENDENT_AMBULATORY_CARE_PROVIDER_SITE_OTHER): Payer: Worker's Compensation | Admitting: Urgent Care

## 2014-10-09 VITALS — BP 126/88 | HR 84 | Temp 97.6°F | Resp 16 | Ht 66.0 in | Wt 174.0 lb

## 2014-10-09 DIAGNOSIS — S62634D Displaced fracture of distal phalanx of right ring finger, subsequent encounter for fracture with routine healing: Secondary | ICD-10-CM | POA: Diagnosis not present

## 2014-10-09 DIAGNOSIS — M79641 Pain in right hand: Secondary | ICD-10-CM

## 2014-10-09 DIAGNOSIS — S6991XD Unspecified injury of right wrist, hand and finger(s), subsequent encounter: Secondary | ICD-10-CM

## 2014-10-09 DIAGNOSIS — M25441 Effusion, right hand: Secondary | ICD-10-CM

## 2014-10-09 DIAGNOSIS — S62639D Displaced fracture of distal phalanx of unspecified finger, subsequent encounter for fracture with routine healing: Secondary | ICD-10-CM

## 2014-10-09 NOTE — Progress Notes (Signed)
    MRN: [id number] DOB: 22-Dec-1955  Subjective:   [name] is a 59 y.o. female presenting for follow up on finger fracture.  Patient was seen yesterday 10/08/2014 and right middle finger fracture was stabilized with finger splint. Radiologist overread reported that there was also right ring finger fracture as well. Patient was called and notified of result, advised to present today for splinting of right ring finger. Denies any other aggravating or relieving factors, no other questions or concerns.  [name] is currently taking meloxicam and Percocet as needed for her right finger pain. She is allergic to penicillins; shellfish-derived products; and morphine and related.  [name]  has a past medical history of Hypertension and Renal disorder. Also  has past surgical history that includes Abdominal hysterectomy.  ROS As in subjective.  Objective:   Vitals: BP 126/88 mmHg  Pulse 84  Temp(Src) 97.6 F (36.4 C) (Oral)  Resp 16  Ht 5\' 6"  (1.676 m)  Wt 174 lb (78.926 kg)  BMI 28.10 kg/m2  SpO2 100%  Physical Exam  Constitutional: She is well-developed, well-nourished, and in no distress.  Cardiovascular: Normal rate.   Pulmonary/Chest: Effort normal.  Musculoskeletal:       Right hand: She exhibits decreased range of motion (right 4th finger), tenderness (right 4th finger), bony tenderness (distal right 4th finger) and swelling (right 4th finger up to MCP joint). She exhibits normal capillary refill, no deformity and no laceration. Normal sensation noted. Normal strength noted.   Assessment and Plan :   1. Finger injury, right, subsequent encounter 2. Closed fracture of tuft of distal phalanx of finger, with routine healing, subsequent encounter 3. Right hand pain 4. Finger joint swelling, right - Stabilized right 4th finger with fold over splint. Patient tolerated this well. Work restrictions provided. - Return for follow up in 1 week.  Jaynee Eagles, PA-C [facility] [phone] 10/09/2014 10:44 AM

## 2014-10-14 ENCOUNTER — Ambulatory Visit: Payer: Worker's Compensation

## 2014-10-14 ENCOUNTER — Ambulatory Visit (INDEPENDENT_AMBULATORY_CARE_PROVIDER_SITE_OTHER): Payer: Worker's Compensation | Admitting: Family Medicine

## 2014-10-14 VITALS — BP 136/82 | HR 77 | Temp 98.1°F | Resp 18 | Ht 66.0 in | Wt 175.8 lb

## 2014-10-14 DIAGNOSIS — M7989 Other specified soft tissue disorders: Secondary | ICD-10-CM

## 2014-10-14 DIAGNOSIS — S62639D Displaced fracture of distal phalanx of unspecified finger, subsequent encounter for fracture with routine healing: Secondary | ICD-10-CM

## 2014-10-14 DIAGNOSIS — S6991XD Unspecified injury of right wrist, hand and finger(s), subsequent encounter: Secondary | ICD-10-CM

## 2014-10-14 DIAGNOSIS — M25441 Effusion, right hand: Secondary | ICD-10-CM

## 2014-10-14 NOTE — Progress Notes (Signed)
    MRN: [id number] DOB: May 27, 1956  Subjective:   [name] is a 59 y.o. female presenting for follow up on right middle and ring finger fractures sustained while at work.   Patient has been taking ibuprofen regularly for her hand pain and swelling. She has also been trying to use her fingers and hand within her pain limits. Today, she reports that her right hand has progressively become swollen in the last week. She has decreased ROM due to her pain. Denies fevers, trauma, erythema, bleeding, numbness and tingling. Denies history of arthritis, rheumatoid, lupus. Denies any other aggravating or relieving factors, no other questions or concerns.  [name] has a current medication list which includes the following prescription(s): ibuprofen. She  is allergic to penicillins; shellfish-derived products; and morphine and related.  [name]  has a past medical history of Hypertension and Renal disorder. Also  has past surgical history that includes Abdominal hysterectomy.  ROS As in subjective.  Objective:   Vitals: BP 136/82 mmHg  Pulse 77  Resp 18  Ht 5\' 6"  (1.676 m)  Wt 175 lb 12.8 oz (79.742 kg)  BMI 28.39 kg/m2  SpO2 96%  Physical Exam  Constitutional: She is oriented to person, place, and time and well-developed, well-nourished, and in no distress.  Cardiovascular: Normal rate.   Pulmonary/Chest: Effort normal.  Musculoskeletal:       Right hand: She exhibits decreased range of motion (slightly decreased flexion), tenderness (3rd and 4th fingers up to MCP joint) and swelling (over hand only). She exhibits normal capillary refill, no deformity and no laceration. Normal sensation noted. Decreased strength (4/5 for 3rd and 4th fingers) noted.  Neurological: She is alert and oriented to person, place, and time.  Skin: Skin is warm and dry. No rash noted. No erythema. No pallor.   Dg Hand 2 View Right  10/14/2014   CLINICAL DATA:  Injury.  Initial evaluation.  EXAM: RIGHT HAND -  2 VIEW  COMPARISON:  None.  FINDINGS: Stable subtle fracture of the distal tuft of the right third digit cannot be excluded. A fracture of the distal tuft of the right fourth digit is noted. No prominent displacement.  IMPRESSION: Stable subtle fracture of the distal tuft of the right third digit cannot be excluded. A minimally displaced fracture of the distal tuft of the right fourth digit is now noted.   Electronically Signed   By: Marcello Moores  Register   On: 10/14/2014 16:12   Assessment and Plan :   1. Closed fracture of tuft of distal phalanx of finger, with routine healing, subsequent encounter 2. Hand injury, right, subsequent encounter 3. Swelling of right hand - Fractures are healing well, stable - Unclear etiology for hand swelling, will follow up closely, work restrictions provided - Return to clinic on 10/18/2014  Jaynee Eagles, PA-C [facility] [phone] 10/14/2014 2:52 PM

## 2014-10-14 NOTE — Progress Notes (Signed)
Patient ID: [name], female   DOB: 05-06-1956, 59 y.o.   MRN: [id number] [facility] reading (PRIMARY) by  Dr. Brigitte Pulse. Right hand xray: distal 3rd and 4th phalynx non-dipsplaced fractures unchanged from prior.  No specific abnormality of second or fifth mid metacarpals where she is having pain and swelling.  Reviewed documentation and xray and agree w/ assessment and plan. Delman Cheadle, MD MPH

## 2014-10-18 ENCOUNTER — Ambulatory Visit (INDEPENDENT_AMBULATORY_CARE_PROVIDER_SITE_OTHER): Payer: Worker's Compensation | Admitting: Urgent Care

## 2014-10-18 VITALS — BP 144/90 | HR 95 | Temp 98.6°F | Resp 16 | Ht 65.0 in | Wt 172.0 lb

## 2014-10-18 DIAGNOSIS — S62639D Displaced fracture of distal phalanx of unspecified finger, subsequent encounter for fracture with routine healing: Secondary | ICD-10-CM

## 2014-10-18 DIAGNOSIS — M79641 Pain in right hand: Secondary | ICD-10-CM

## 2014-10-18 NOTE — Progress Notes (Signed)
    MRN: [id number] DOB: March 13, 1956  Subjective:   [name] is a 59 y.o. female presenting for followup on right hand swelling, right third and fourth finger fractures.  Reports improvement in both pain and swelling. However she still has pain over distal portion of the right third and fourth finger and dorsal aspect of right hand. She also has finger swelling of right index and right pinky fingers. She continues to take ibuprofen 600 mg for her pain and inflammation which he thinks is helping. Patient still uses and very gingerly due to her pain but doesn't suspect that she she has lost strength. Denies fevers, erythema, loss of sensation. Denies any other aggravating or relieving factors, no other questions or concerns.  Shevaun has a current medication list which includes the following prescription(s): ibuprofen. She is allergic to penicillins; shellfish-derived products; and morphine and related.  ROS As in subjective.  Objective:   Vitals: BP 144/90 mmHg  Pulse 95  Temp(Src) 98.6 F (37 C) (Oral)  Resp 16  Ht 5\' 5"  (1.651 m)  Wt 172 lb (78.019 kg)  BMI 28.62 kg/m2  SpO2 97%  Physical Exam  Constitutional: She is oriented to person, place, and time. She appears well-developed and well-nourished.  Cardiovascular: Normal rate.   Pulmonary/Chest: Effort normal.  Musculoskeletal:       Right hand: She exhibits decreased range of motion (still cannot make a complete fist), tenderness (Over base of second metacarpal), bony tenderness (Over base of second metacarpal and distal phalanges of third and fourth fingers) and swelling (over right second and fifth fingers). She exhibits normal two-point discrimination, normal capillary refill, no deformity and no laceration. Normal sensation noted. Normal strength noted.  Neurological: She is alert and oriented to person, place, and time. She has normal reflexes.  Skin: Skin is warm and dry. No rash noted. No erythema. No pallor.    Assessment and Plan :   1. Closed fracture of tuft of distal phalanx of finger, with routine healing, subsequent encounter 2. Right hand pain - Swelling and ROM have significantly improved, advised patient to continue using ibuprofen for her pain and inflammation. - Continue with current work restrictions, return in one week for reevaluation  Jaynee Eagles, PA-C [facility] [phone] 10/18/2014 9:43 PM

## 2014-10-18 NOTE — Patient Instructions (Signed)
-   You may use 400mg  ibuprofen every 6 hours or 600mg  every 8 hours for hand pain and swelling.   Finger Fracture Fractures of fingers are breaks in the bones of the fingers. There are many types of fractures. There are different ways of treating these fractures. Your health care provider will discuss the best way to treat your fracture. CAUSES Traumatic injury is the main cause of broken fingers. These include:  Injuries while playing sports.  Workplace injuries.  Falls. RISK FACTORS Activities that can increase your risk of finger fractures include:  Sports.  Workplace activities that involve machinery.  A condition called osteoporosis, which can make your bones less dense and cause them to fracture more easily. SIGNS AND SYMPTOMS The main symptoms of a broken finger are pain and swelling within 15 minutes after the injury. Other symptoms include:  Bruising of your finger.  Stiffness of your finger.  Numbness of your finger.  Exposed bones (compound fracture) if the fracture is severe. DIAGNOSIS  The best way to diagnose a broken bone is with X-ray imaging. Additionally, your health care provider will use this X-ray image to evaluate the position of the broken finger bones.  TREATMENT  Finger fractures can be treated with:   Nonreduction--This means the bones are in place. The finger is splinted without changing the positions of the bone pieces. The splint is usually left on for about a week to 10 days. This will depend on your fracture and what your health care provider thinks.  Closed reduction--The bones are put back into position without using surgery. The finger is then splinted.  Open reduction and internal fixation--The fracture site is opened. Then the bone pieces are fixed into place with pins or some type of hardware. This is seldom required. It depends on the severity of the fracture. HOME CARE INSTRUCTIONS   Follow your health care provider's instructions  regarding activities, exercises, and physical therapy.  Only take over-the-counter or prescription medicines for pain, discomfort, or fever as directed by your health care provider. SEEK MEDICAL CARE IF: You have pain or swelling that limits the motion or use of your fingers. SEEK IMMEDIATE MEDICAL CARE IF:  Your finger becomes numb. MAKE SURE YOU:   Understand these instructions.  Will watch your condition.  Will get help right away if you are not doing well or get worse. Document Released: 09/16/2000 Document Revised: 03/25/2013 Document Reviewed: 01/14/2013 [facility] Patient Information 2015 [place], [place]. This information is not intended to replace advice given to you by your health care provider. Make sure you discuss any questions you have with your health care provider.

## 2014-10-25 ENCOUNTER — Ambulatory Visit (INDEPENDENT_AMBULATORY_CARE_PROVIDER_SITE_OTHER): Payer: Worker's Compensation | Admitting: Family Medicine

## 2014-10-25 VITALS — BP 118/86 | HR 85 | Temp 98.1°F | Resp 20 | Ht 65.0 in | Wt 172.0 lb

## 2014-10-25 DIAGNOSIS — M25441 Effusion, right hand: Secondary | ICD-10-CM | POA: Diagnosis not present

## 2014-10-25 DIAGNOSIS — M79641 Pain in right hand: Secondary | ICD-10-CM

## 2014-10-25 DIAGNOSIS — S6991XD Unspecified injury of right wrist, hand and finger(s), subsequent encounter: Secondary | ICD-10-CM

## 2014-10-25 DIAGNOSIS — S62639D Displaced fracture of distal phalanx of unspecified finger, subsequent encounter for fracture with routine healing: Secondary | ICD-10-CM

## 2014-10-25 NOTE — Patient Instructions (Signed)

## 2014-10-25 NOTE — Progress Notes (Signed)
    MRN: [id number] DOB: 11/09/1955  Subjective:   [name] is a 59 y.o. female presenting for follow up on closed finger fracture in 3rd and 4th fingers sustained while at work on 10/04/2014. Today, patient reports improvement in her pain since switching to ibuprofen 600mg  TID with food. She still has pain along her 3rd and 4th fingers, swelling of her 2nd and 5th fingers. Pain is improved over wrist and dorsal aspect of right hand. She denies fevers, erythema, numbness and tingling, decreased strength. Denies any other aggravating or relieving factors, no other questions or concerns.  [name] is allergic to penicillins; shellfish-derived products; and morphine and related.  ROS As in subjective.  Objective:   Vitals: BP 118/86 mmHg  Pulse 85  Temp(Src) 98.1 F (36.7 C) (Oral)  Resp 20  Ht 5\' 5"  (1.651 m)  Wt 172 lb (78.019 kg)  BMI 28.62 kg/m2  SpO2 96%  Physical Exam  Constitutional: She is oriented to person, place, and time. She appears well-developed and well-nourished.  Cardiovascular: Normal rate.   Pulmonary/Chest: Effort normal.  Musculoskeletal:       Right hand: She exhibits tenderness, bony tenderness (3rd and 4th fingers) and swelling (2nd and 5th fingers). She exhibits normal range of motion, normal two-point discrimination, normal capillary refill, no deformity and no laceration. Normal sensation noted. Normal strength noted.  Neurological: She is alert and oriented to person, place, and time.  Skin: Skin is warm and dry. No rash noted. No erythema. No pallor.   PROCEDURE: Finger splint. Reapplied fold-over finger splint to 3rd right finger. Used buddy tape system with 4th finger. Bandage applied, after-care instructions reviewed.  Assessment and Plan :   1. Hand injury, right, subsequent encounter 2. Closed fracture of tuft of distal phalanx of finger, with routine healing, subsequent encounter 3. Finger joint swelling, right 4. Right hand pain -  Stable, progressing well, still has trace edema over right index and pinky fingers.  - Continue with work restrictions. Note provided for employer. - Return to clinic in 1 week for re-evaluation. Consider reimaging 3rd and 4th fingers if pain has improved or is minimal on exam.  Jaynee Eagles, PA-C [facility] [phone] 10/25/2014 5:13 PM

## 2014-10-26 ENCOUNTER — Ambulatory Visit: Payer: Worker's Compensation

## 2014-10-26 ENCOUNTER — Other Ambulatory Visit: Payer: Self-pay | Admitting: Physician Assistant

## 2014-10-26 ENCOUNTER — Ambulatory Visit (INDEPENDENT_AMBULATORY_CARE_PROVIDER_SITE_OTHER): Payer: Worker's Compensation | Admitting: Emergency Medicine

## 2014-10-26 VITALS — BP 140/84 | Temp 98.2°F | Ht 65.0 in | Wt 172.0 lb

## 2014-10-26 DIAGNOSIS — S62639D Displaced fracture of distal phalanx of unspecified finger, subsequent encounter for fracture with routine healing: Secondary | ICD-10-CM

## 2014-10-26 DIAGNOSIS — M79644 Pain in right finger(s): Secondary | ICD-10-CM

## 2014-10-26 DIAGNOSIS — S6991XD Unspecified injury of right wrist, hand and finger(s), subsequent encounter: Secondary | ICD-10-CM | POA: Diagnosis not present

## 2014-10-26 NOTE — Progress Notes (Signed)
   Subjective:    Patient ID: [name], female    DOB: September 29, 1955, 59 y.o.   MRN: [id number]  HPI  Worker's comp patient presents for burning sensation on tops of ring and middle finger of right hand following tuft fracture of both 10/04/14 when sliding door closed on hand. Has been keeping fingers wrapped and splinted and has had minimal pain, however, this morning had this sensation which she likened to matches being held over skin. Sensation is localized at DIP of both joints. No additional swelling from baseline. Denies redness, increased bruising, radiate pain, numbness, or tingling. Has not been using hand at all and job has been modified appropriately. Only using ibuprofen for pain now.   Review of Systems  Musculoskeletal: Positive for joint swelling and arthralgias. Negative for myalgias.  Skin: Negative for color change.  Neurological: Positive for weakness. Negative for numbness.       Objective:   Physical Exam  Constitutional: She is oriented to person, place, and time. She appears well-developed and well-nourished. No distress.  Blood pressure 140/84, temperature 98.2 F (36.8 C), temperature source Oral, height 5\' 5"  (1.651 m), weight 172 lb (78.019 kg).  HENT:  Head: Normocephalic and atraumatic.  Right Ear: External ear normal.  Left Ear: External ear normal.  Eyes: Conjunctivae are normal. Pupils are equal, round, and reactive to light. Right eye exhibits no discharge. Left eye exhibits no discharge. No scleral icterus.  Musculoskeletal: She exhibits edema and tenderness.       Right wrist: Normal.       Right hand: She exhibits decreased range of motion, tenderness, bony tenderness and swelling. She exhibits normal capillary refill, no deformity and no laceration. Normal sensation noted. Decreased strength noted. She exhibits finger abduction and thumb/finger opposition. She exhibits no wrist extension trouble.       Hands: Neurological: She is alert and oriented  to person, place, and time. She has normal reflexes. She exhibits normal muscle tone.  Skin: Skin is warm and dry. No rash noted. She is not diaphoretic. No erythema. No pallor.   [facility] reading (PRIMARY) by  Dr. Ouida Sills. Tuft fracture of middle and ring finger of right hand.     Assessment & Plan:  1. Closed fracture of tuft of distal phalanx of finger, with routine healing, subsequent encounter 2. Finger injury, right, subsequent encounter 3. Pain of finger of right hand Continue ibuprofen for pain. Re-wrapped and splinted with soft finger splint. RTC 11/04/14 for re-evaluation. - DG Finger Ring Right; Future   Alveta Heimlich PA-C  [facility] 10/26/2014 4:06 PM

## 2014-10-27 NOTE — Progress Notes (Signed)
Patient ID: [name], female   DOB: [birth year], 59 y.o.   MRN: [id number] Reviewed documentation and agree w/ assessment and plan. Delman Cheadle, MD MPH

## 2014-10-28 NOTE — Progress Notes (Signed)
  Medical screening examination/treatment/procedure(s) were performed by non-physician practitioner and as supervising physician I was immediately available for consultation/collaboration.     

## 2014-11-02 ENCOUNTER — Ambulatory Visit (INDEPENDENT_AMBULATORY_CARE_PROVIDER_SITE_OTHER): Payer: Worker's Compensation | Admitting: Emergency Medicine

## 2014-11-02 VITALS — BP 140/90 | HR 82 | Temp 97.9°F | Resp 16 | Ht 65.0 in | Wt 172.5 lb

## 2014-11-02 DIAGNOSIS — S62639D Displaced fracture of distal phalanx of unspecified finger, subsequent encounter for fracture with routine healing: Secondary | ICD-10-CM

## 2014-11-02 NOTE — Patient Instructions (Signed)
Fracture finger Finger Fracture Fractures of fingers are breaks in the bones of the fingers. There are many types of fractures. There are different ways of treating these fractures. Your health care provider will discuss the best way to treat your fracture. CAUSES Traumatic injury is the main cause of broken fingers. These include:  Injuries while playing sports.  Workplace injuries.  Falls. RISK FACTORS Activities that can increase your risk of finger fractures include:  Sports.  Workplace activities that involve machinery.  A condition called osteoporosis, which can make your bones less dense and cause them to fracture more easily. SIGNS AND SYMPTOMS The main symptoms of a broken finger are pain and swelling within 15 minutes after the injury. Other symptoms include:  Bruising of your finger.  Stiffness of your finger.  Numbness of your finger.  Exposed bones (compound fracture) if the fracture is severe. DIAGNOSIS  The best way to diagnose a broken bone is with X-ray imaging. Additionally, your health care provider will use this X-ray image to evaluate the position of the broken finger bones.  TREATMENT  Finger fractures can be treated with:   Nonreduction--This means the bones are in place. The finger is splinted without changing the positions of the bone pieces. The splint is usually left on for about a week to 10 days. This will depend on your fracture and what your health care provider thinks.  Closed reduction--The bones are put back into position without using surgery. The finger is then splinted.  Open reduction and internal fixation--The fracture site is opened. Then the bone pieces are fixed into place with pins or some type of hardware. This is seldom required. It depends on the severity of the fracture. HOME CARE INSTRUCTIONS   Follow your health care provider's instructions regarding activities, exercises, and physical therapy.  Only take over-the-counter or  prescription medicines for pain, discomfort, or fever as directed by your health care provider. SEEK MEDICAL CARE IF: You have pain or swelling that limits the motion or use of your fingers. SEEK IMMEDIATE MEDICAL CARE IF:  Your finger becomes numb. MAKE SURE YOU:   Understand these instructions.  Will watch your condition.  Will get help right away if you are not doing well or get worse. Document Released: 09/16/2000 Document Revised: 03/25/2013 Document Reviewed: 01/14/2013 [facility] Patient Information 2015 [place], [place]. This information is not intended to replace advice given to you by your health care provider. Make sure you discuss any questions you have with your health care provider.

## 2014-11-02 NOTE — Progress Notes (Signed)
   Subjective:  Patient ID: [name], female    DOB: 19-Mar-1956  Age: 59 y.o. MRN: [id number]  CC: Wound Check   HPI [name] presents for recheck on her fractured third and fourth fingers. She has been splinted since her original injury in the middle of April and has had decreasing pain and swelling in her middle finger. He has not been not removing the splint to bathe her or do anything else because of the complexity of the splint taping.  He is eager to have the splint removed due to an upcoming trip to [place].  Patient denies any other current complaint  Outpatient Prescriptions Prior to Visit  Medication Sig Dispense Refill  . ibuprofen (ADVIL,MOTRIN) 800 MG tablet Take 800 mg by mouth every 8 (eight) hours as needed for mild pain.     No facility-administered medications prior to visit.    ROS Review of Systems  Constitutional: Negative for fever, chills, diaphoresis, activity change, appetite change, fatigue and unexpected weight change.  HENT: Negative for hearing loss, congestion, sore throat, rhinorrhea, sneezing, trouble swallowing, neck pain, dental problem, voice change, postnasal drip and tinnitus.  Eyes: Negative for photophobia, pain, discharge, redness and visual disturbance.  Respiratory: Negative for cough, chest tightness, shortness of breath and wheezing.  Cardiovascular: Negative for chest pain, palpitations and leg swelling.  Gastrointestinal: Negative for nausea, abdominal pain, diarrhea, constipation and blood in stool.  Endocrine: Negative for cold intolerance, heat intolerance and polyuria.  Genitourinary: Negative for dysuria, urgency, frequency, hematuria, flank pain, vaginal bleeding, vaginal discharge, difficulty urinating, genital sores, menstrual problem and pelvic pain.  Musculoskeletal: Negative for back pain and gait problem.  Neurological: Negative for dizziness, weakness and headaches.  Hematological: Negative for adenopathy.  Does not bruise/bleed easily.  Psychiatric/Behavioral: Negative for sleep dishturbance, self-injury and dysphoric mood. The patient is not nervous/anxious.     Objective:  BP 140/90 mmHg  Pulse 82  Temp(Src) 97.9 F (36.6 C) (Oral)  Resp 16  Ht 5\' 5"  (1.651 m)  Wt 172 lb 8 oz (78.245 kg)  BMI 28.71 kg/m2  SpO2 98%  BP Readings from Last 3 Encounters:  11/02/14 140/90  10/26/14 140/84  10/25/14 118/86    Wt Readings from Last 3 Encounters:  11/02/14 172 lb 8 oz (78.245 kg)  10/26/14 172 lb (78.019 kg)  10/25/14 172 lb (78.019 kg)    Physical Exam   GEN: WDWN, NAD, Non-toxic, Alert & Oriented x 3 HEENT: Atraumatic, Normocephalic.  Ears and Nose: No external deformity. EXTR: No clubbing/cyanosis/edema NEURO: Normal gait.  PSYCH: Normally interactive. Conversant. Not depressed or anxious appearing.  Calm demeanor.  RIGHT 3rd finger.  Swollen and some ecchymosis proximal to nail.  No deformity    Assessment & Plan:   Cloris was seen today for wound check.  Diagnoses and all orders for this visit:  Closed fracture of tuft of distal phalanx of finger, with routine healing, subsequent encounter   I am having Ms. Chunn maintain her ibuprofen.  No orders of the defined types were placed in this encounter.     Follow-up: Return in about 2 weeks (around 11/16/2014).  Roselee Culver, MD

## 2014-11-16 ENCOUNTER — Encounter: Payer: Self-pay | Admitting: Urgent Care

## 2014-11-16 ENCOUNTER — Ambulatory Visit: Payer: Worker's Compensation

## 2014-11-16 ENCOUNTER — Ambulatory Visit (INDEPENDENT_AMBULATORY_CARE_PROVIDER_SITE_OTHER): Payer: Worker's Compensation | Admitting: Family Medicine

## 2014-11-16 VITALS — BP 130/80 | HR 92 | Temp 98.4°F | Resp 17 | Ht 65.0 in | Wt 174.6 lb

## 2014-11-16 DIAGNOSIS — M25441 Effusion, right hand: Secondary | ICD-10-CM | POA: Diagnosis not present

## 2014-11-16 DIAGNOSIS — S62639D Displaced fracture of distal phalanx of unspecified finger, subsequent encounter for fracture with routine healing: Secondary | ICD-10-CM

## 2014-11-16 DIAGNOSIS — S6991XD Unspecified injury of right wrist, hand and finger(s), subsequent encounter: Secondary | ICD-10-CM | POA: Diagnosis not present

## 2014-11-16 NOTE — Progress Notes (Signed)
Patient ID: JENALYN GIRDNER, female   DOB: May 17, 1956, 59 y.o.   MRN: [id number] [facility] reading (PRIMARY) by  Dr. Brigitte Pulse. 3rd and 4th fingers:  Prior comminuted non-displaced fractures over distal phalynx starting to show signs of healing - callus on distal 3rd phalanx, no sig callus formation on 4th distal phalynx fracture but definitely improved from prior.  Keep protected with splint while at work but start ROM exercises at home and remove splint overnight and while at rest. Recheck in 3-4 wks, sooner if worse. Will hopefully be able to release at that time.  Reviewed documentation and xray and agree w/ assessment and plan. Delman Cheadle, MD MPH

## 2014-11-16 NOTE — Patient Instructions (Signed)
Finger Fracture (Phalangeal) A broken bone of the finger (phalangealfracture) is a common injury for athletes. A single injury (trauma) is likely to fracture multiple bones on the same or different fingers. SYMPTOMS   Severe pain, at the time of injury.  Pain, tenderness, swelling, and later bruising of the finger and then the hand.  Visible deformity, if the fracture is complete and the bone fragments separate enough to distort the normal shape.  Numbness or coldness from swelling in the finger, causing pressure on blood vessels or nerves (uncommon). CAUSES  Direct or indirect injury (trauma) to the finger.  RISK INCREASES WITH:   Contact sports (football, rugby) or other sports where injury to the hand is likely (soccer, baseball, basketball).  Sports that require hitting (boxing, martial arts).  History of bone or joint disease, such as osteoporosis, or previous bone restraint.  Poor hand strength and flexibility. PREVENTION   For contact sports, wear appropriate and properly fitted protective equipment for the hand.  Learn and use proper technique when hitting, punching, or landing after a fall.  If you had a previous finger injury or hand restraint, use tape or padding to protect the finger when playing sports where finger injury is likely. PROGNOSIS  With proper treatment and normal alignment of the bones, healing can usually be expected in 4 to 6 weeks. Sometimes, surgery is needed.  RELATED COMPLICATIONS   Fracture does not heal (nonunion).  Bone heals in wrong position (malunion).  Chronic pain, stiffness, or swelling of the hand.  Excessive bleeding, causing pressure on nerves and blood vessels.  Unstable or arthritic joint, following repeated injury or delayed treatment.  Hindrance of normal growth in children.  Infection in skin broken over the fracture (open fracture) or at the incision or pin sites from surgery.  Shortening of injured bones.  Bony bumps  or loss of shape of the fingers.  Arthritic or stiff finger joint, if the fracture reaches the joint. TREATMENT  If the bones are properly aligned, treatment involves ice and medicine to reduce pain and inflammation. Then, the finger is restrained for 4 or more weeks, to allow for healing. If the fracture is out of alignment (displaced), involves more than one bone, or involves a joint, surgery is usually advised. Surgery often involves placing removable pins, screws, and sometimes plates, to hold the bones in proper alignment. After restraint (with or without surgery), stretching and strengthening exercises are needed. Exercises may be completed at home or with a therapist. For certain sports, wearing a splint or having the finger taped during future activity is advised.  MEDICATION   If pain medicine is needed, nonsteroidal anti-inflammatory medicines (aspirin and ibuprofen), or other minor pain relievers (acetaminophen), are often advised.  Do not take pain medicine for 7 days before surgery.  Prescription pain relievers are usually prescribed only after surgery. Use only as directed and only as much as you need. COLD THERAPY   Cold treatment (icing) relieves pain and reduces inflammation. Cold treatment should be applied for 10 to 15 minutes every 2 to 3 hours, and immediately after activity that aggravates your symptoms. Use ice packs or an ice massage. SEEK MEDICAL CARE IF:   Pain, tenderness, or swelling gets worse, despite treatment.  You experience pain, numbness, or coldness in the hand.  Blue, gray, or dark color appears in the fingernails.  Any of the following occur after surgery: fever, increased pain, swelling, redness, drainage of fluids, or bleeding in the affected area.  New,  unexplained symptoms develop. (Drugs used in treatment may produce side effects.) Document Released: 06/04/2005 Document Revised: 08/27/2011 Document Reviewed: 09/16/2008 [facility] Patient  Information 2015 [place], [place]. This information is not intended to replace advice given to you by your health care provider. Make sure you discuss any questions you have with your health care provider.

## 2014-11-17 NOTE — Progress Notes (Signed)
    MRN: [id number] DOB: December 26, 1955  Subjective:   [name] is a 59 y.o. female presenting for follow up on workers comp injury regarding reinforcing or fractures. Patient sustained fractures on 10/04/2014. Reports significant improvement in her pain and swelling, however her right middle finger still swollen. She does admit a little more stiffness in her fourth and third fingers. Has continued to use ibuprofen for relief of swelling and pain. Patient has followed all instructions. Denies any other aggravating or relieving factors, no other questions or concerns.  [name] has a current medication list which includes the following prescription(s): ibuprofen. She is allergic to penicillins; shellfish-derived products; and morphine and related.  [name]  has a past medical history of Hypertension and Renal disorder. Also  has past surgical history that includes Abdominal hysterectomy.  ROS As in subjective.  Objective:   Vitals: BP 130/80 mmHg  Pulse 92  Temp(Src) 98.4 F (36.9 C) (Oral)  Resp 17  Ht 5\' 5"  (1.651 m)  Wt 174 lb 9.6 oz (79.198 kg)  BMI 29.05 kg/m2  SpO2 98%  Physical Exam  Constitutional: She is oriented to person, place, and time. She appears well-developed and well-nourished.  Cardiovascular: Normal rate.   Pulmonary/Chest: Effort normal.  Musculoskeletal:       Right hand: She exhibits decreased range of motion (flexion at 4th finger, PIP joint) and tenderness (DIP joint of 3rd finger). She exhibits no bony tenderness, normal capillary refill, no deformity, no laceration and no swelling. Normal sensation noted. Normal strength noted.  Neurological: She is alert and oriented to person, place, and time.  Skin: Skin is warm and dry. No rash noted. No erythema. No pallor.   Dg Finger Middle Right  11/16/2014   CLINICAL DATA:  Previous distal phalangeal fracture  EXAM: RIGHT MIDDLE FINGER 2+V  COMPARISON:  10/26/2014  FINDINGS: The well corticated bony density is  noted along the lateral aspect of the distal phalangeal tuft stable from the prior exam. The previously seen fourth distal phalangeal fracture is again noted in stable as well. No acute abnormality is seen.  IMPRESSION: Chronic changes without acute abnormality.   Electronically Signed   By: Inez Catalina M.D.   On: 11/16/2014 18:43   Dg Finger Ring Right  11/16/2014   CLINICAL DATA:  Follow-up right ring finger fracture.  EXAM: RIGHT RING FINGER 2+V  COMPARISON:  Prior exams.  FINDINGS: Nondisplaced tuft fracture of the ring finger is again identified.  No new fracture, subluxation or dislocation identified.  IMPRESSION: Unchanged nondisplaced tuft fracture.   Electronically Signed   By: Margarette Canada M.D.   On: 11/16/2014 18:42   Assessment and Plan :   1. Finger injury, right, subsequent encounter 2. Finger joint swelling, right 3. Closed fracture of tuft of distal phalanx of finger, with routine healing, subsequent encounter - Significantly improved albeit delayed healing likely due to her history of complex regional pain syndrome. Continue conservative management, continue ibuprofen for pain and swelling, worker's comp note provided. Patient is to wear splint while at work, she was instructed to remove splint while at home and start stretching and exercising right hand and fingers. - Patient is to follow-up in 2 weeks  Jaynee Eagles, PA-C [facility] [phone] 11/17/2014 8:29 AM

## 2014-11-30 ENCOUNTER — Ambulatory Visit (INDEPENDENT_AMBULATORY_CARE_PROVIDER_SITE_OTHER): Payer: Worker's Compensation | Admitting: Family Medicine

## 2014-11-30 ENCOUNTER — Telehealth: Payer: Self-pay | Admitting: Urgent Care

## 2014-11-30 VITALS — BP 118/82 | HR 83 | Temp 98.5°F | Resp 18 | Ht 65.0 in | Wt 176.0 lb

## 2014-11-30 DIAGNOSIS — S62639D Displaced fracture of distal phalanx of unspecified finger, subsequent encounter for fracture with routine healing: Secondary | ICD-10-CM | POA: Diagnosis not present

## 2014-11-30 DIAGNOSIS — M79641 Pain in right hand: Secondary | ICD-10-CM | POA: Diagnosis not present

## 2014-11-30 MED ORDER — GABAPENTIN 300 MG PO CAPS: 300.0000 mg | ORAL_CAPSULE | Freq: Three times a day (TID) | ORAL | Status: DC

## 2014-11-30 NOTE — Patient Instructions (Signed)
Gabapentin capsules or tablets What is this medicine? GABAPENTIN (GA ba pen tin) is used to control partial seizures in adults with epilepsy. It is also used to treat certain types of nerve pain. This medicine may be used for other purposes; ask your health care provider or pharmacist if you have questions. COMMON BRAND NAME(S): Gabarone, Neurontin What should I tell my health care provider before I take this medicine? They need to know if you have any of these conditions: -kidney disease -suicidal thoughts, plans, or attempt; a previous suicide attempt by you or a family member -an unusual or allergic reaction to gabapentin, other medicines, foods, dyes, or preservatives -pregnant or trying to get pregnant -breast-feeding How should I use this medicine? Take this medicine by mouth with a glass of water. Follow the directions on the prescription label. You can take it with or without food. If it upsets your stomach, take it with food.Take your medicine at regular intervals. Do not take it more often than directed. Do not stop taking except on your doctor's advice. If you are directed to break the 600 or 800 mg tablets in half as part of your dose, the extra half tablet should be used for the next dose. If you have not used the extra half tablet within 28 days, it should be thrown away. A special MedGuide will be given to you by the pharmacist with each prescription and refill. Be sure to read this information carefully each time. Talk to your pediatrician regarding the use of this medicine in children. Special care may be needed. Overdosage: If you think you have taken too much of this medicine contact a poison control center or emergency room at once. NOTE: This medicine is only for you. Do not share this medicine with others. What if I miss a dose? If you miss a dose, take it as soon as you can. If it is almost time for your next dose, take only that dose. Do not take double or extra  doses. What may interact with this medicine? Do not take this medicine with any of the following medications: -other gabapentin products This medicine may also interact with the following medications: -alcohol -antacids -antihistamines for allergy, cough and cold -certain medicines for anxiety or sleep -certain medicines for depression or psychotic disturbances -homatropine; hydrocodone -naproxen -narcotic medicines (opiates) for pain -phenothiazines like chlorpromazine, mesoridazine, prochlorperazine, thioridazine This list may not describe all possible interactions. Give your health care provider a list of all the medicines, herbs, non-prescription drugs, or dietary supplements you use. Also tell them if you smoke, drink alcohol, or use illegal drugs. Some items may interact with your medicine. What should I watch for while using this medicine? Visit your doctor or health care professional for regular checks on your progress. You may want to keep a record at home of how you feel your condition is responding to treatment. You may want to share this information with your doctor or health care professional at each visit. You should contact your doctor or health care professional if your seizures get worse or if you have any new types of seizures. Do not stop taking this medicine or any of your seizure medicines unless instructed by your doctor or health care professional. Stopping your medicine suddenly can increase your seizures or their severity. Wear a medical identification bracelet or chain if you are taking this medicine for seizures, and carry a card that lists all your medications. You may get drowsy, dizzy, or have blurred   vision. Do not drive, use machinery, or do anything that needs mental alertness until you know how this medicine affects you. To reduce dizzy or fainting spells, do not sit or stand up quickly, especially if you are an older patient. Alcohol can increase drowsiness and  dizziness. Avoid alcoholic drinks. Your mouth may get dry. Chewing sugarless gum or sucking hard candy, and drinking plenty of water will help. The use of this medicine may increase the chance of suicidal thoughts or actions. Pay special attention to how you are responding while on this medicine. Any worsening of mood, or thoughts of suicide or dying should be reported to your health care professional right away. Women who become pregnant while using this medicine may enroll in the [place] American Antiepileptic Drug Pregnancy Registry by calling 1-888-233-2334. This registry collects information about the safety of antiepileptic drug use during pregnancy. What side effects may I notice from receiving this medicine? Side effects that you should report to your doctor or health care professional as soon as possible: -allergic reactions like skin rash, itching or hives, swelling of the face, lips, or tongue -worsening of mood, thoughts or actions of suicide or dying Side effects that usually do not require medical attention (report to your doctor or health care professional if they continue or are bothersome): -constipation -difficulty walking or controlling muscle movements -dizziness -nausea -slurred speech -tiredness -tremors -weight gain This list may not describe all possible side effects. Call your doctor for medical advice about side effects. You may report side effects to FDA at 1-800-FDA-1088. Where should I keep my medicine? Keep out of reach of children. Store at room temperature between 15 and 30 degrees C (59 and 86 degrees F). Throw away any unused medicine after the expiration date. NOTE: This sheet is a summary. It may not cover all possible information. If you have questions about this medicine, talk to your doctor, pharmacist, or health care provider.  2015, [employer] Standard. (2013-02-05 09:12:48)  

## 2014-11-30 NOTE — Progress Notes (Signed)
    MRN: [id number] DOB: [birth year]  Subjective:   [name] is a 59 y.o. female presenting for followup on third and fourth finger fracture sustained while at work. This is a workers comp case that was started in April 2016. Today, patient reports that she is done as instructed including hand exercises and protecting her finger at work with a finger splint provided. However, in the last 2 weeks she has experienced recurrent hand swelling mostly after performing home exercises and after work. She has also had pain and burning sensation over her MCP joints, PIP joints; also reports difficulty in being at the DIP joint of the third and fourth fingers. Has continued to use ibuprofen 400-600 mg every 6-8 hours with food, this does provide some relief. Denies any other aggravating or relieving factors, no other questions or concerns.  Jozalynn has a current medication list which includes the following prescription(s): ibuprofen. She is allergic to penicillins; shellfish-derived products; and morphine and related.  Rajah  has a past medical history of Hypertension and Renal disorder. Also  has past surgical history that includes Abdominal hysterectomy.  ROS As in subjective.  Objective:   Vitals: BP 118/82 mmHg  Pulse 83  Temp(Src) 98.5 F (36.9 C) (Oral)  Resp 18  Ht 5\' 5"  (1.651 m)  Wt 176 lb (79.833 kg)  BMI 29.29 kg/m2  SpO2 97%  Physical Exam  Constitutional: She is oriented to person, place, and time. She appears well-developed and well-nourished.  Cardiovascular: Normal rate.   Pulmonary/Chest: Effort normal.  Musculoskeletal:       Right hand: She exhibits decreased range of motion (DIP joints of 3rd and 4th fingers), tenderness (over MCP joints up to PIP joints mostly over 2nd - 4th fingers) and swelling (trace edema over MCP joints and 3rd finger). She exhibits no bony tenderness, normal capillary refill, no deformity and no laceration. Normal sensation noted. Normal  strength noted.  Neurological: She is alert and oriented to person, place, and time.  Skin: Skin is warm and dry. No rash noted. No erythema. No pallor.   Assessment and Plan :   1. Closed fracture of tuft of distal phalanx of finger, with routine healing, subsequent encounter 2. Pain of right hand - Will start patient on gabapentin for neuropathic type pain, will also refer her to physical therapy since we have exhausted our interventions. Continue ibuprofen for pain and swelling, continue current work restrictions. Followup as needed or if physical therapy is not proved, will send to ortho.  Jaynee Eagles, PA-C [facility] [phone] 11/30/2014 5:01 PM

## 2014-11-30 NOTE — Telephone Encounter (Signed)
Requested patient call back for follow up. Please let her know that she will continue to follow up with [place] here if we get Physical Therapy approved. She can simply return to our clinic once physical therapy is completed. If worker's comp does not approve physical therapy then we will refer her to ortho and they will in fact take over her care. Thank you!

## 2014-12-02 NOTE — Telephone Encounter (Signed)
Spoke with pt, advised message from [place]. Pt understood. She would like to know how long it will take for [facility] to approve this. Deloris Ping can you check on this?

## 2014-12-02 NOTE — Progress Notes (Signed)
I have reviewed this pts care with Mr. Bess Harvest.  Plan to have her come in for a recheck in one month.  Agree with his assessment and plan

## 2014-12-03 NOTE — Telephone Encounter (Signed)
I discussed case with Dr. Brigitte Pulse and our administrative staff that handles worker's comp and we all decided the best referral for management of her persistent hand pain would be to a hand specialist. This referral was requested and unfortunately, we do not have a timeline for when her employer/worker's comp will approve the referral. However, if this has not taken place in 2 weeks, please tell her to return to clinic for re-evaluation. This is just to make sure that everything is going okay and she is not lost to follow up and the ball keeps rolling with her worker's comp case. Thank you!

## 2014-12-06 ENCOUNTER — Telehealth: Payer: Self-pay | Admitting: Urgent Care

## 2014-12-06 DIAGNOSIS — M79641 Pain in right hand: Secondary | ICD-10-CM

## 2014-12-06 DIAGNOSIS — S62639G Displaced fracture of distal phalanx of unspecified finger, subsequent encounter for fracture with delayed healing: Secondary | ICD-10-CM

## 2014-12-06 NOTE — Telephone Encounter (Signed)
Deloris Ping is taking care of this.

## 2014-12-06 NOTE — Telephone Encounter (Signed)
Thank you for being patient with me and helping me to take care of this, [name]!

## 2014-12-06 NOTE — Telephone Encounter (Signed)
-----   Message from Barrington Ellison sent at 12/06/2014  3:34 PM EDT ----- Regarding: Order for referral Per our conversation, you stated this patient needs to see a hand specialist and not physical therapy. I have closed out the physical therapy order, but in order to send her to a hand specialist, we will need an order for the hand specialist for the wc adjuster  Thanks! Deloris Ping

## 2014-12-23 ENCOUNTER — Telehealth: Payer: Self-pay | Admitting: Urgent Care

## 2014-12-23 NOTE — Telephone Encounter (Signed)
Fax was sent to me by worker's comp requesting notes for patient and additional information for starting Gabapentin. Please provide copy of last notes including Dr. Lillie Fragmin agreement with A&P. I will place fax and note on her medication in the nurse's box. Thank you!

## 2014-12-24 NOTE — Telephone Encounter (Signed)
Placed in nurses box then forwarded to the fax box.

## 2015-08-19 IMAGING — CR DG FINGER MIDDLE 2+V*R*
3 series · 3 of 3 positions shown · non-contrast
Comparison: 10/26/2014

CLINICAL DATA: Previous distal phalangeal fracture

EXAM:
RIGHT MIDDLE FINGER 2+V

[PA]
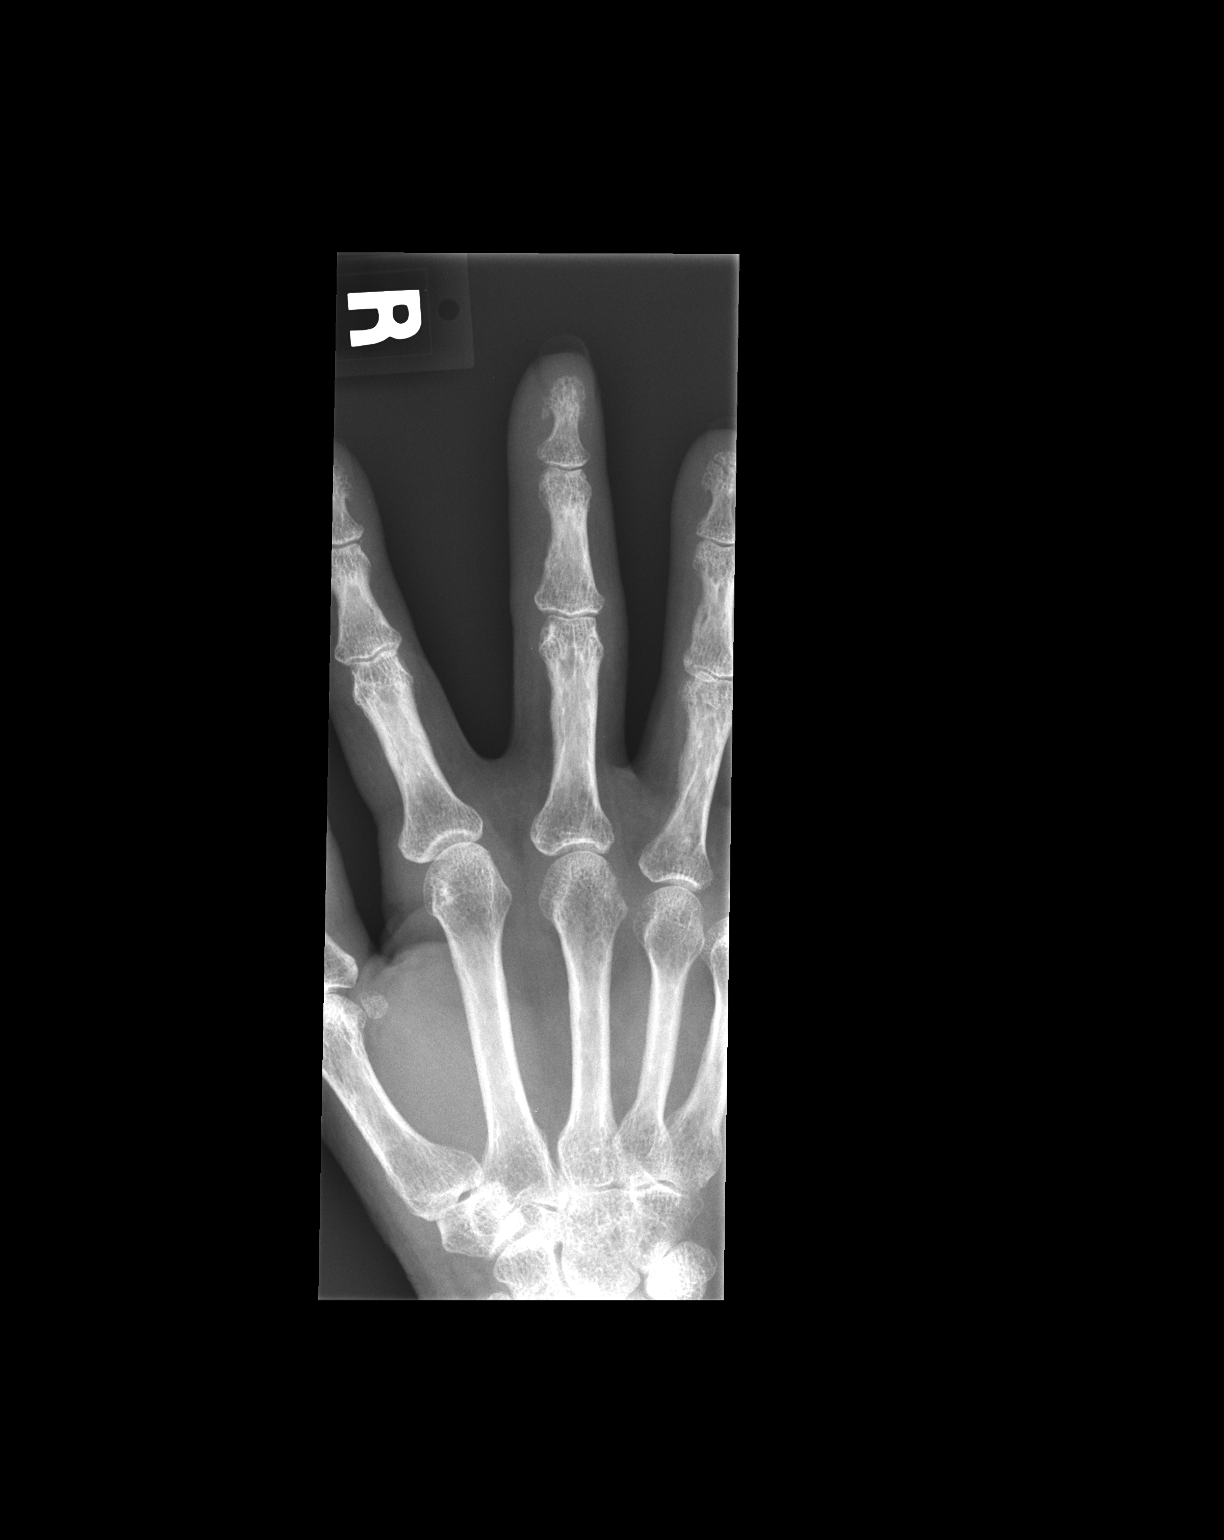

[pa obl]
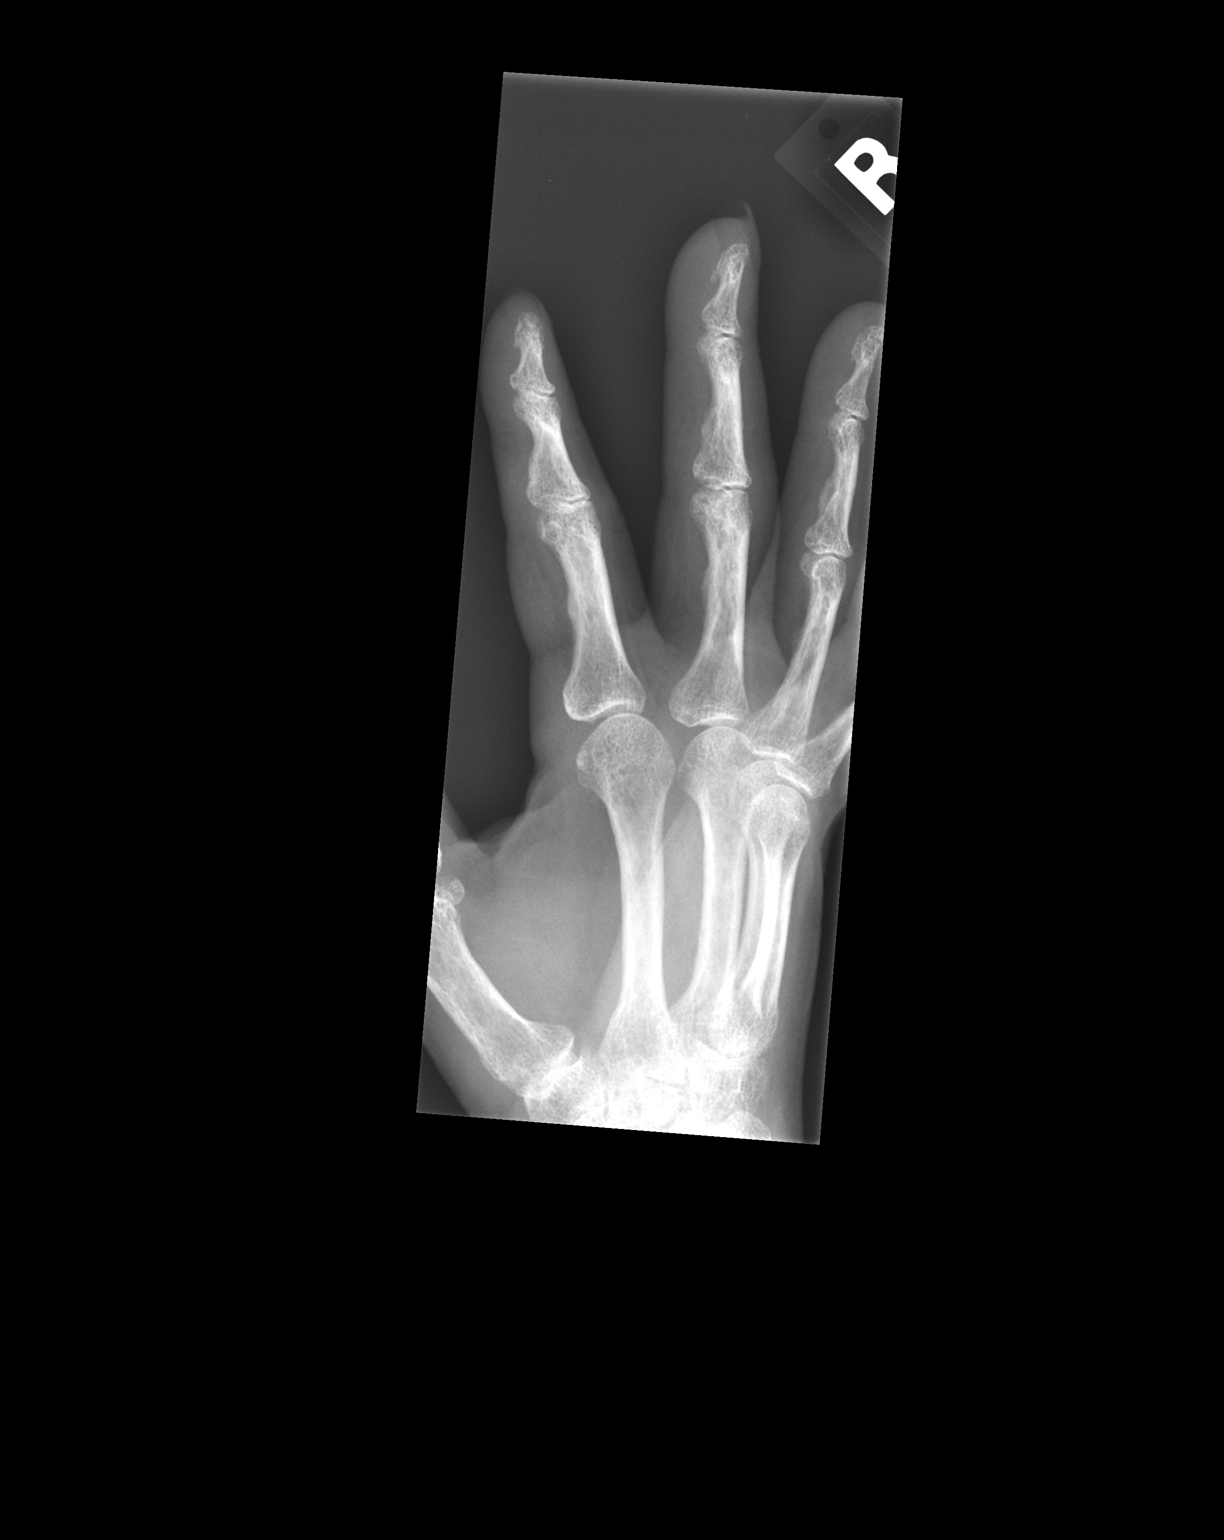

[lateral]
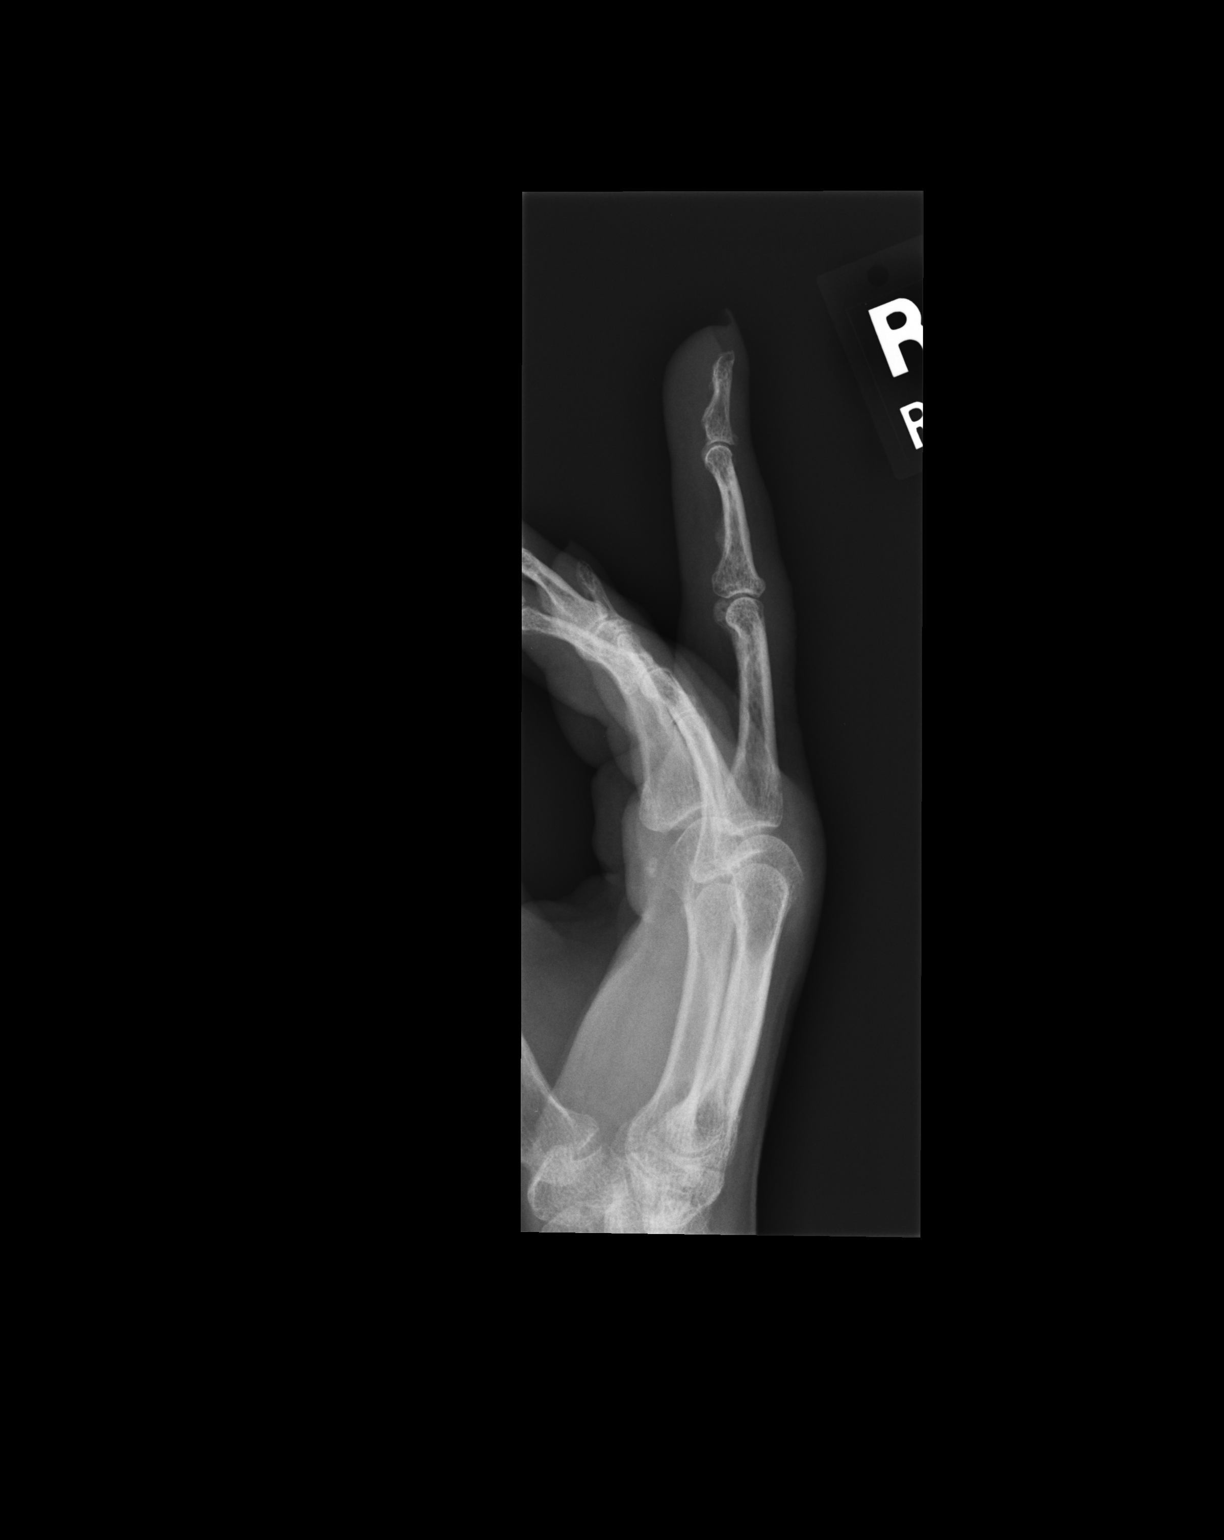

[3 of 3 positions shown; findings below may reference images not displayed]

FINDINGS: The well corticated bony density is noted along the lateral aspect
of the distal phalangeal tuft stable from the prior exam. The
previously seen fourth distal phalangeal fracture is again noted in
stable as well. No acute abnormality is seen.
IMPRESSION: Chronic changes without acute abnormality.

## 2015-08-19 IMAGING — CR DG FINGER RING 2+V*R*
3 series · 3 of 3 positions shown · non-contrast
Comparison: Prior exams.

CLINICAL DATA: Follow-up right ring finger fracture.

EXAM:
RIGHT RING FINGER 2+V

[PA]
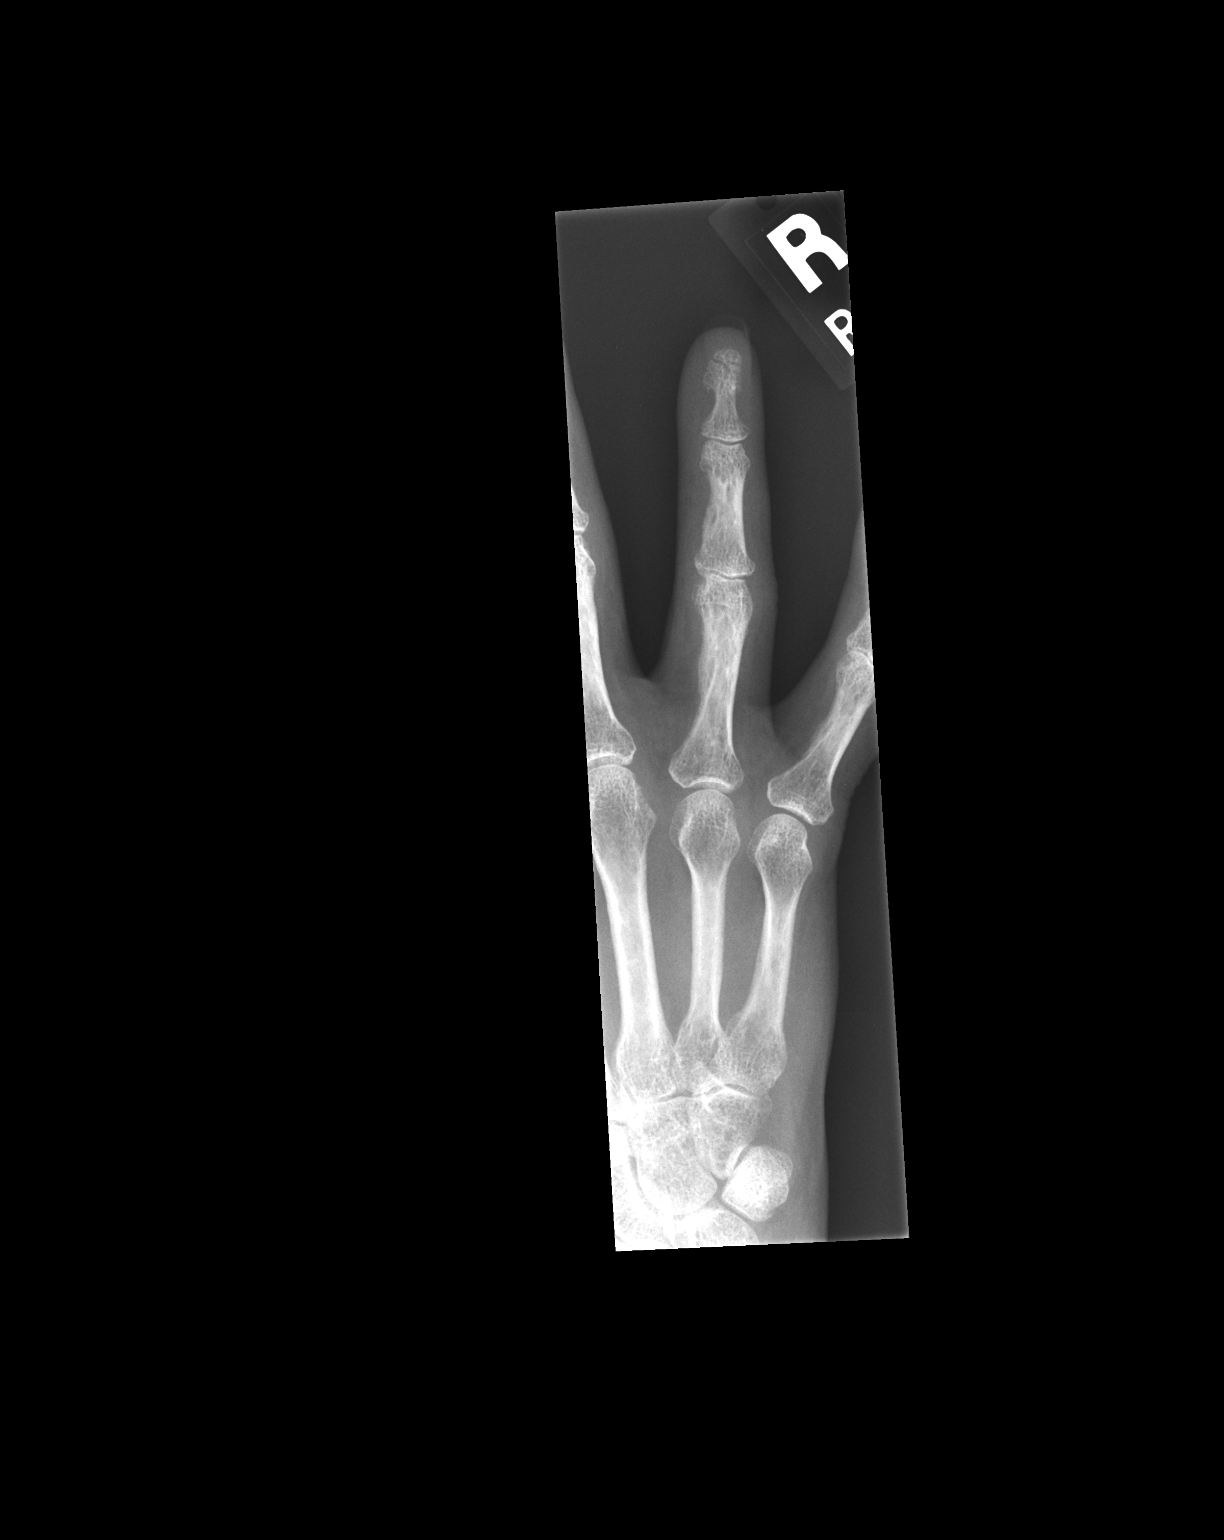

[pa obl]
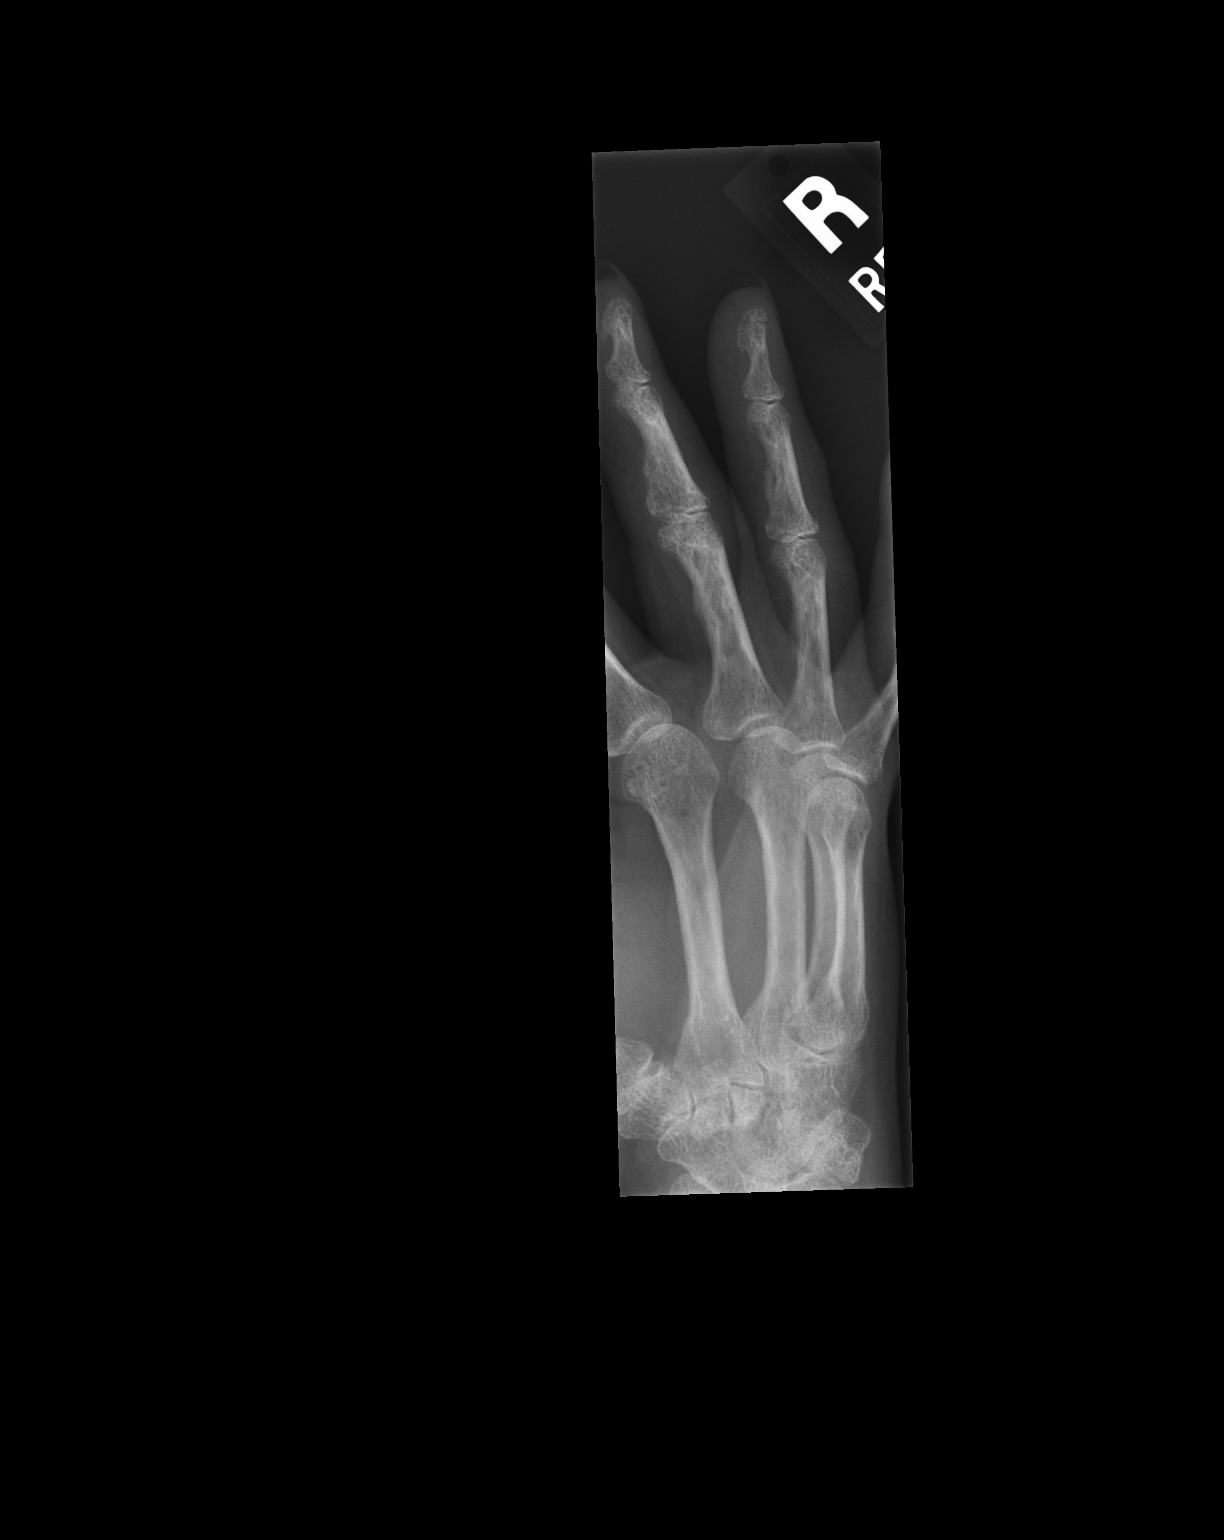

[lateral]
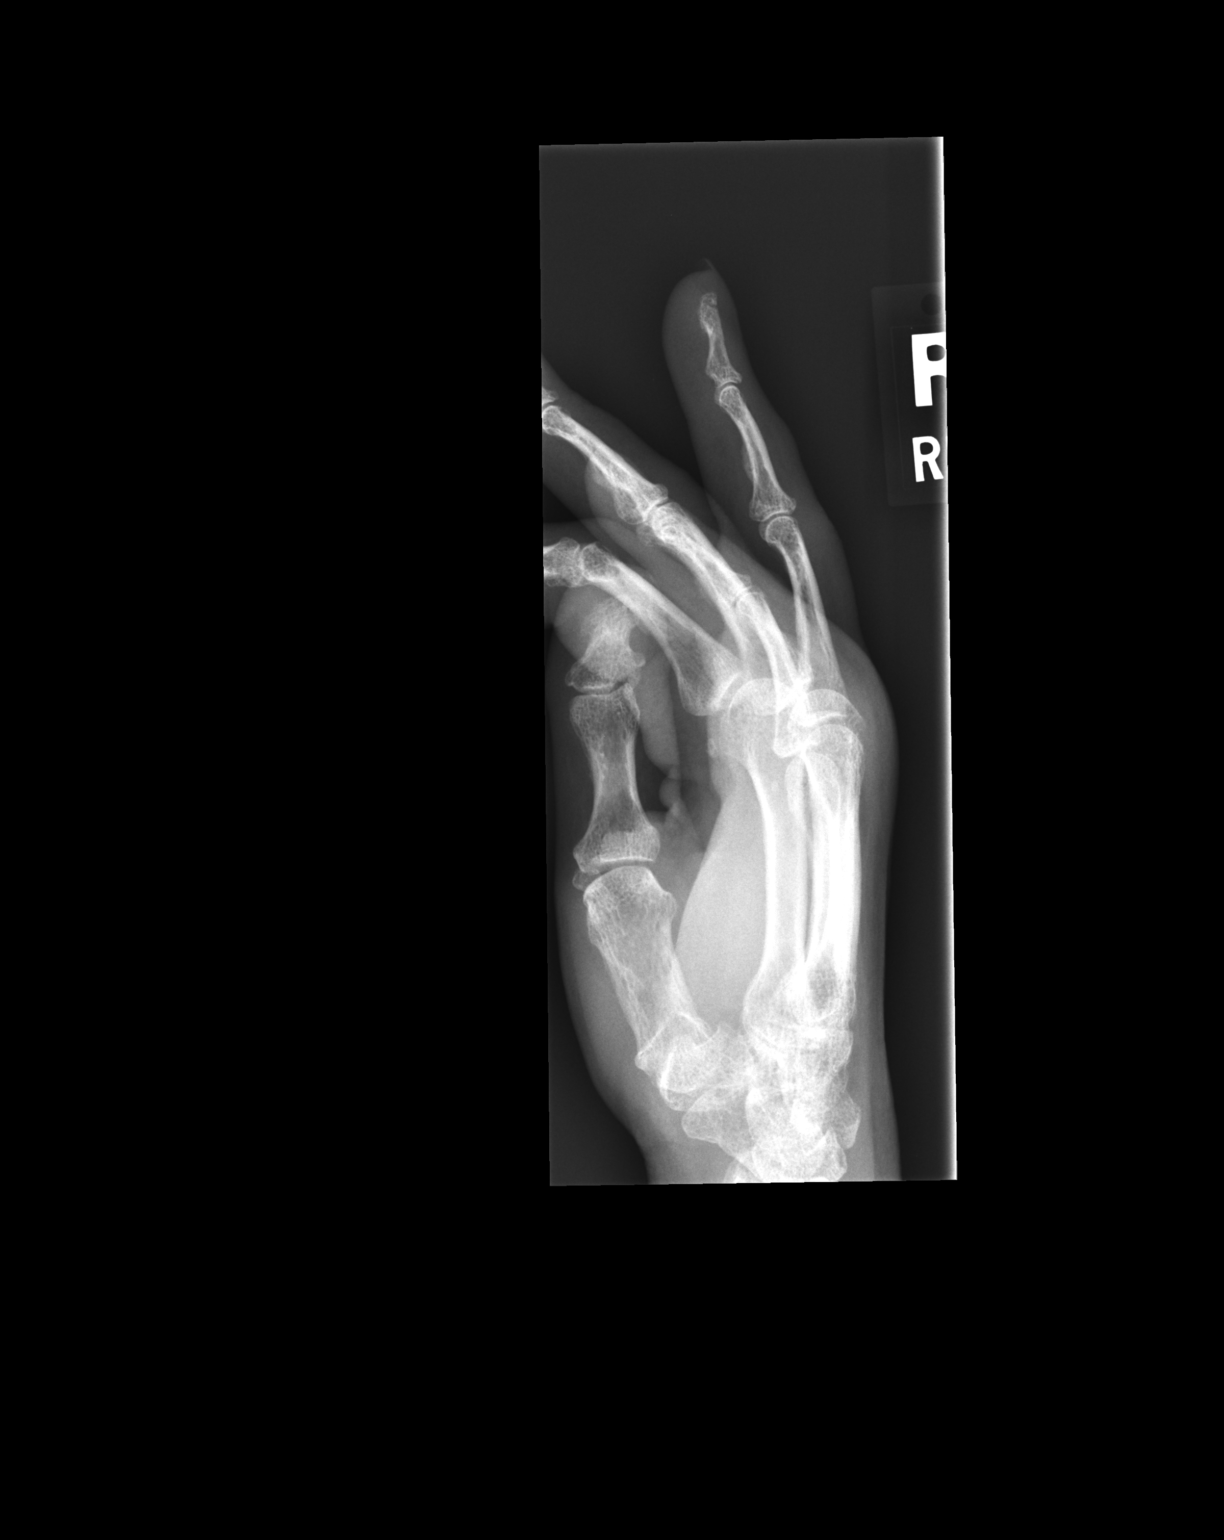

[3 of 3 positions shown; findings below may reference images not displayed]

FINDINGS: Nondisplaced tuft fracture of the ring finger is again identified.

No new fracture, subluxation or dislocation identified.
IMPRESSION: Unchanged nondisplaced tuft fracture.

## 2015-10-20 ENCOUNTER — Ambulatory Visit (INDEPENDENT_AMBULATORY_CARE_PROVIDER_SITE_OTHER): Payer: Worker's Compensation | Admitting: Physician Assistant

## 2015-10-20 VITALS — BP 160/98 | HR 89 | Temp 99.0°F | Resp 18 | Ht 65.0 in | Wt 169.0 lb

## 2015-10-20 DIAGNOSIS — Z23 Encounter for immunization: Secondary | ICD-10-CM

## 2015-10-20 DIAGNOSIS — S50811A Abrasion of right forearm, initial encounter: Secondary | ICD-10-CM | POA: Diagnosis not present

## 2015-10-20 MED ORDER — MUPIROCIN 2 % EX OINT: TOPICAL_OINTMENT | CUTANEOUS | Status: DC

## 2015-10-20 NOTE — Patient Instructions (Signed)
     IF you received an x-ray today, you will receive an invoice from [facility]. Please contact [facility] at [phone] with questions or concerns regarding your invoice.   IF you received labwork today, you will receive an invoice from [facility]. Please contact [facility] at [phone] with questions or concerns regarding your invoice.   Our billing staff will not be able to assist you with questions regarding bills from these companies.  You will be contacted with the lab results as soon as they are available. The fastest way to get your results is to activate your My Chart account. Instructions are located on the last page of this paperwork. If you have not heard from us regarding the results in 2 weeks, please contact this office.      

## 2015-10-20 NOTE — Progress Notes (Signed)
   10/20/2015 1:30 PM   DOB: 1956/04/06 / MRN: [id number]  SUBJECTIVE:  [name] is a 60 y.o. female presenting for a work related injury.  Reports she scraped her right arm on a piece of metal.  She coverd the wound and came here. She denies weakness and changes in sensation of the hand.     Review of Systems  Musculoskeletal: Negative for myalgias.  Neurological: Negative for focal weakness.  Endo/Heme/Allergies: Does not bruise/bleed easily.    Problem list and medications reviewed and updated by myself where necessary, and exist elsewhere in the encounter.   OBJECTIVE:  BP 160/98 mmHg  Pulse 89  Temp(Src) 99 F (37.2 C)  Resp 18  Ht 5\' 5"  (1.651 m)  Wt 169 lb (76.658 kg)  BMI 28.12 kg/m2  SpO2 99%  Physical Exam  Constitutional: She is oriented to person, place, and time. She appears well-developed and well-nourished. No distress.  Pulmonary/Chest: Effort normal and breath sounds normal.  Musculoskeletal: Normal range of motion.       Arms: Neurological: She is alert and oriented to person, place, and time.  Skin: She is not diaphoretic.    No results found for this or any previous visit (from the past 72 hour(s)).  No results found.  ASSESSMENT AND PLAN  [name] was seen today for laceration.  Diagnoses and all orders for this visit:  Forearm abrasion, right, initial encounter: Mild.  Advised that she return to work and keep the wound covered for the next five days.  RTC as needed.  Of note, her BP was elevated today however she has no history of this. Pressure recheck at 160/88. This is most likely 2/2 to stress regarding the injury.  -     mupirocin ointment (BACTROBAN) 2 %; Apply daily and cover the wound for the next five days.  Need for Tdap vaccination -     Tdap vaccine greater than or equal to 7yo IM    The patient was advised to call or return to clinic if she does not see an improvement in symptoms or to seek the care of the closest  emergency department if she worsens with the above plan.   Philis Fendt, MHS, PA-C [facility] 10/20/2015 1:30 PM

## 2016-07-02 ENCOUNTER — Encounter (HOSPITAL_COMMUNITY): Payer: Self-pay | Admitting: Emergency Medicine

## 2016-07-02 ENCOUNTER — Emergency Department (HOSPITAL_COMMUNITY)
Admission: EM | Admit: 2016-07-02 | Discharge: 2016-07-02 | Disposition: A | Discharge status: Home / Self Care | Payer: BLUE CROSS/BLUE SHIELD | Attending: Emergency Medicine | Admitting: Emergency Medicine

## 2016-07-02 DIAGNOSIS — I82431 Acute embolism and thrombosis of right popliteal vein: Secondary | ICD-10-CM | POA: Insufficient documentation

## 2016-07-02 DIAGNOSIS — Z79899 Other long term (current) drug therapy: Secondary | ICD-10-CM | POA: Diagnosis not present

## 2016-07-02 DIAGNOSIS — M7989 Other specified soft tissue disorders: Secondary | ICD-10-CM | POA: Diagnosis present

## 2016-07-02 DIAGNOSIS — I10 Essential (primary) hypertension: Secondary | ICD-10-CM | POA: Insufficient documentation

## 2016-07-02 DIAGNOSIS — Z87891 Personal history of nicotine dependence: Secondary | ICD-10-CM | POA: Insufficient documentation

## 2016-07-02 DIAGNOSIS — I82401 Acute embolism and thrombosis of unspecified deep veins of right lower extremity: Secondary | ICD-10-CM

## 2016-07-02 HISTORY — DX: Pure hypercholesterolemia, unspecified: E78.00

## 2016-07-02 NOTE — ED Provider Notes (Signed)
[facility] DEPT Provider Note   CSN: [id number] Arrival date & time: 07/02/16  1232     History   Chief Complaint Chief Complaint  Patient presents with  . DVT    HPI [name] is a 61 y.o. female.  HPI Patient seen and evaluated by her primary care physician 3 days ago for right lower extremity swelling and was started on Xarelto.  She underwent imaging of her right lower extremity for DVT today which demonstrated common femoral DVT with questionable mobile thrombus.  She was in the emergency department for possible filter placement.  Patient ports no chest pain shortness of breath.  She's never had a DVT before.  She has tolerated the Xarelto thus far   Past Medical History:  Diagnosis Date  . Hypercholesteremia   . Hypertension   . Renal disorder     Patient Active Problem List   Diagnosis Date Noted  . DEPRESSION 07/15/2007  . COLLAGENOUS COLITIS 07/15/2007  . WEIGHT LOSS 07/15/2007  . HEADACHE 07/15/2007  . COLONIC POLYPS, BENIGN 02/19/2001    Past Surgical History:  Procedure Laterality Date  . ABDOMINAL HYSTERECTOMY      OB History    No data available       Home Medications    Prior to Admission medications   Medication Sig Start Date End Date Taking? Authorizing Provider  gabapentin (NEURONTIN) 300 MG capsule Take 1 capsule (300 mg total) by mouth 3 (three) times daily. Patient not taking: Reported on 10/20/2015 11/30/14   Jaynee Eagles, PA-C  ibuprofen (ADVIL,MOTRIN) 800 MG tablet Take 800 mg by mouth every 8 (eight) hours as needed for mild pain. Reported on 10/20/2015    Historical Provider, MD  mupirocin ointment (BACTROBAN) 2 % Apply daily and cover the wound for the next five days. 10/20/15   Tereasa Coop, PA-C    Family History No family history on file.  Social History Social History  Substance Use Topics  . Smoking status: Former Smoker    Packs/day: 1.00    Years: 42.00    Types: Cigarettes  . Smokeless tobacco: Not on file    . Alcohol use 0.0 oz/week     Comment: occasional     Allergies   Penicillins; Shellfish-derived products; and Morphine and related   Review of Systems Review of Systems  All other systems reviewed and are negative.    Physical Exam Updated Vital Signs BP 145/76 (BP Location: Left Arm)   Pulse 92   Temp 98 F (36.7 C) (Oral)   Resp 17   Ht 5\' 6"  (1.676 m)   Wt 165 lb (74.8 kg)   SpO2 100%   BMI 26.63 kg/m   Physical Exam  Constitutional: She is oriented to person, place, and time. She appears well-developed and well-nourished.  HENT:  Head: Normocephalic.  Eyes: EOM are normal.  Neck: Normal range of motion.  Pulmonary/Chest: Effort normal.  Abdominal: She exhibits no distension.  Musculoskeletal: Normal range of motion.  Mild swelling of the right lower extremity as compared to left  Neurological: She is alert and oriented to person, place, and time.  Psychiatric: She has a normal mood and affect.  Nursing note and vitals reviewed.    ED Treatments / Results  Labs (all labs ordered are listed, but only abnormal results are displayed) Labs Reviewed - No data to display  EKG  EKG Interpretation None       Radiology  TECHNIQUE: The veins of right lower extremity  were interrogated from the visible common femoral vein to the distal popliteal vein.The junction of the greater saphenous with the common femoral vein as well as the posterior tibial veins were evaluated. Gray scale, color, spectral, and doppler sonography was utilized. COMPARISON: None. INDICATION: Cellulitis. Patient on blood thinners. Family history of DVT, PE  FINDINGS: Evidence of nonocclusive thrombus in the right common femoral vein, superficial femoral vein, popliteal vein and posterior tibial vein with partial compressibility. Thrombus appears to undulate within the lumen of the common femoral vein.  Synechiae seen in the popliteal vein. Some of the findings could represent  chronic DVT.  Procedures Procedures (including critical care time)  Medications Ordered in ED Medications - No data to display   Initial Impression / Assessment and Plan / ED Course  I have reviewed the triage vital signs and the nursing notes.  Pertinent labs & imaging results that were available during my care of the patient were reviewed by me and considered in my medical decision making (see chart for details).  Clinical Course     Questionable mobile thrombus.  I discussed this with our interventional radiology team who recommends ongoing anticoagulation at this time and reports there is no indication for filter.  No chest pain or shortness of breath.  Patient is been encouraged to return to the ER for new or worsening symptoms including but not limited to chest pain shortness of breath.  She has the Xarelto at home and will follow-up with her primary care physician and continue oral anticoagulants at this time  Final Clinical Impressions(s) / ED Diagnoses   Final diagnoses:  Acute thromboembolism of deep veins of right lower extremity [facility])    New Prescriptions New Prescriptions   No medications on file     Jola Schmidt, MD 07/02/16 1637

## 2016-07-02 NOTE — ED Triage Notes (Addendum)
PT reports was seen this morning for venous dopplar. 3 clots identified. One is traveling in popliteal vein. Just started on blood thinners (xerelto) Friday night as precaution. Dr. Jonelle Sidle wanted her evaluated for a filter. No distress noted. Reports it to be uncomfortable but no real pain. No SOB. Leg is swollen; has pedal pulses.

## 2016-07-02 NOTE — Discharge Instructions (Signed)
Continue the xarelto as instructed by your primary care doctor

## 2016-07-22 ENCOUNTER — Emergency Department (HOSPITAL_COMMUNITY): Payer: BLUE CROSS/BLUE SHIELD

## 2016-07-22 ENCOUNTER — Emergency Department (HOSPITAL_COMMUNITY)
Admission: EM | Admit: 2016-07-22 | Discharge: 2016-07-22 | Disposition: A | Discharge status: Home / Self Care | Payer: BLUE CROSS/BLUE SHIELD | Attending: Emergency Medicine | Admitting: Emergency Medicine

## 2016-07-22 ENCOUNTER — Other Ambulatory Visit: Payer: Self-pay

## 2016-07-22 ENCOUNTER — Encounter (HOSPITAL_COMMUNITY): Payer: Self-pay

## 2016-07-22 DIAGNOSIS — I10 Essential (primary) hypertension: Secondary | ICD-10-CM | POA: Diagnosis not present

## 2016-07-22 DIAGNOSIS — R079 Chest pain, unspecified: Secondary | ICD-10-CM

## 2016-07-22 DIAGNOSIS — Z79899 Other long term (current) drug therapy: Secondary | ICD-10-CM | POA: Diagnosis not present

## 2016-07-22 DIAGNOSIS — R0789 Other chest pain: Secondary | ICD-10-CM | POA: Diagnosis present

## 2016-07-22 DIAGNOSIS — R51 Headache: Secondary | ICD-10-CM | POA: Insufficient documentation

## 2016-07-22 DIAGNOSIS — R519 Headache, unspecified: Secondary | ICD-10-CM

## 2016-07-22 DIAGNOSIS — Z87891 Personal history of nicotine dependence: Secondary | ICD-10-CM | POA: Diagnosis not present

## 2016-07-22 HISTORY — DX: Acute embolism and thrombosis of unspecified deep veins of unspecified lower extremity: I82.409

## 2016-07-22 LAB — BASIC METABOLIC PANEL
Anion gap: 8 (ref 5–15)
BUN: 17 mg/dL (ref 6–20)
CHLORIDE: 108 mmol/L (ref 101–111)
CO2: 24 mmol/L (ref 22–32)
CREATININE: 0.66 mg/dL (ref 0.44–1.00)
Calcium: 9.6 mg/dL (ref 8.9–10.3)
GFR calc Af Amer: >60 mL/min (ref >60)
GFR calc non Af Amer: >60 mL/min (ref >60)
GLUCOSE: 121 mg/dL — AB (ref 65–99)
POTASSIUM: 3.6 mmol/L (ref 3.5–5.1)
Sodium: 140 mmol/L (ref 135–145)

## 2016-07-22 LAB — CBC
HEMATOCRIT: 43.3 % (ref 36.0–46.0)
Hemoglobin: 14.6 g/dL (ref 12.0–15.0)
MCH: 30.7 pg (ref 26.0–34.0)
MCHC: 33.7 g/dL (ref 30.0–36.0)
MCV: 91 fL (ref 78.0–100.0)
PLATELETS: 219 10*3/uL (ref 150–400)
RBC: 4.76 MIL/uL (ref 3.87–5.11)
RDW: 12.7 % (ref 11.5–15.5)
WBC: 10.5 10*3/uL (ref 4.0–10.5)

## 2016-07-22 LAB — TROPONIN I: Troponin I: <0.03 ng/mL (ref <0.03)

## 2016-07-22 LAB — I-STAT TROPONIN, ED: Troponin i, poc: 0 ng/mL (ref 0.00–0.08)

## 2016-07-22 MED ORDER — ACETAMINOPHEN 500 MG PO TABS
1000.0000 mg | ORAL_TABLET | Freq: Once | ORAL | Status: AC
  Administered 2016-07-22: 1000 mg via ORAL
  Filled 2016-07-22: qty 2

## 2016-07-22 MED ORDER — IOPAMIDOL (ISOVUE-370) INJECTION 76%
INTRAVENOUS | Status: AC
  Administered 2016-07-22: 100 mL
  Filled 2016-07-22: qty 100

## 2016-07-22 MED ORDER — ACETAMINOPHEN 500 MG PO TABS: 1000.0000 mg | ORAL_TABLET | Freq: Three times a day (TID) | ORAL | 0 refills | Status: AC | PRN

## 2016-07-22 NOTE — ED Notes (Signed)
Lobby updated on delays in rooming and warm blankets offered

## 2016-07-22 NOTE — ED Notes (Signed)
Patient transported to CT 

## 2016-07-22 NOTE — ED Triage Notes (Signed)
Pt endorses central non radiating chest pain that began this evening with a headache. Pt was diagnosed with DVT, 3 bloods clots in right leg. Pt was placed on xarelto. VSS in triage. Pt denies shob.

## 2016-07-22 NOTE — ED Provider Notes (Signed)
[facility] DEPT Provider Note   CSN: [id number] Arrival date & time: 07/22/16  [id number]  By signing my name below, I, Oleh Genin, attest that this documentation has been prepared under the direction and in the presence of Fatima Blank, MD. Electronically Signed: Oleh Genin, Scribe. 07/22/16. 3:51 AM.   History   Chief Complaint Chief Complaint  Patient presents with  . Chest Pain  . Headache    HPI [name] is a 61 y.o. female with history of HTN and HLD who presents to the ED for evaluation of chest pain and headache. This patient was initially seen at this facility on 1/15 after findings of RLE DVT by primary care and placement on Xarelto. She was discharged with return precautions. Approximately 5 hours ago the patient was at home when she developed a pressure-like headache extending from her R posterior neck into the top of her head. Denies any changes in vision, trauma, photophobia, phonophobia, or ear pain. She is also reporting L sided pressure like chest pain which extends laterally. This was intermittent for several days and are now constant and associated with painful respirations. She denies any nausea or vomiting, congestion, fevers, or abdominal pain. She has no other complaints at this time.  The history is provided by the patient. No language interpreter was used.    Past Medical History:  Diagnosis Date  . DVT (deep venous thrombosis) ([place])   . Hypercholesteremia   . Hypertension   . Renal disorder     Patient Active Problem List   Diagnosis Date Noted  . DEPRESSION 07/15/2007  . COLLAGENOUS COLITIS 07/15/2007  . WEIGHT LOSS 07/15/2007  . HEADACHE 07/15/2007  . COLONIC POLYPS, BENIGN 02/19/2001    Past Surgical History:  Procedure Laterality Date  . ABDOMINAL HYSTERECTOMY      OB History    No data available       Home Medications    Prior to Admission medications   Medication Sig Start Date End Date Taking? Authorizing  Provider  acetaminophen (TYLENOL) 500 MG tablet Take 2 tablets (1,000 mg total) by mouth every 8 (eight) hours as needed. Do not take more than 4000 mg of acetaminophen (Tylenol) in a 24-hour period. Please note that other medicines that you may be prescribed may have Tylenol as well. 07/22/16 07/27/16  Fatima Blank, MD  gabapentin (NEURONTIN) 300 MG capsule Take 1 capsule (300 mg total) by mouth 3 (three) times daily. Patient not taking: Reported on 10/20/2015 11/30/14   Jaynee Eagles, PA-C  ibuprofen (ADVIL,MOTRIN) 800 MG tablet Take 800 mg by mouth every 8 (eight) hours as needed for mild pain. Reported on 10/20/2015    Historical Provider, MD  mupirocin ointment (BACTROBAN) 2 % Apply daily and cover the wound for the next five days. 10/20/15   Tereasa Coop, PA-C    Family History History reviewed. No pertinent family history.  Social History Social History  Substance Use Topics  . Smoking status: Former Smoker    Packs/day: 1.00    Years: 42.00    Types: Cigarettes  . Smokeless tobacco: Never Used  . Alcohol use 0.0 oz/week     Comment: occasional     Allergies   Penicillins; Shellfish-derived products; and Morphine and related   Review of Systems Review of Systems 10 systems reviewed and all are negative for acute change except as noted in the HPI.  Physical Exam Updated Vital Signs BP 125/65   Pulse 68   Temp 97.5 F (  36.4 C) (Oral)   Resp 14   Ht 5\' 6"  (1.676 m)   Wt 165 lb (74.8 kg)   SpO2 98%   BMI 26.63 kg/m   Physical Exam  Constitutional: She is oriented to person, place, and time. She appears well-developed and well-nourished. No distress.  HENT:  Head: Normocephalic and atraumatic.  Nose: Nose normal.  Eyes: Conjunctivae and EOM are normal. Pupils are equal, round, and reactive to light. Right eye exhibits no discharge. Left eye exhibits no discharge. No scleral icterus.  Neck: Normal range of motion. Neck supple. Muscular tenderness present. No spinous  process tenderness present.    Cardiovascular: Normal rate, regular rhythm and normal heart sounds.  Exam reveals no gallop and no friction rub.   No murmur heard. Pulmonary/Chest: Effort normal and breath sounds normal. No stridor. No respiratory distress. She has no rales.  Abdominal: Soft. She exhibits no distension. There is no tenderness.  Musculoskeletal: She exhibits no edema.       Cervical back: She exhibits no tenderness.  Neurological: She is alert and oriented to person, place, and time. No sensory deficit. Coordination normal.  Sensation and strength are normal and equal in all four extremities. No facial droop.   Skin: Skin is warm and dry. No rash noted. She is not diaphoretic. No erythema.  Psychiatric: She has a normal mood and affect.  Vitals reviewed.    ED Treatments / Results  DIAGNOSTIC STUDIES: Oxygen Saturation is 100 percent on room air which is normal by my interpretation.   COORDINATION OF CARE: 3:44 AM Discussed treatment plan with pt at bedside and pt agreed to plan.  Labs (all labs ordered are listed, but only abnormal results are displayed) Labs Reviewed  BASIC METABOLIC PANEL - Abnormal; Notable for the following:       Result Value   Glucose, Bld 121 (*)    All other components within normal limits  CBC  TROPONIN I  I-STAT TROPOININ, ED    EKG  EKG Interpretation  Date/Time:  Sunday July 22 2016 00:55:32 EST Ventricular Rate:  84 PR Interval:  142 QRS Duration: 78 QT Interval:  366 QTC Calculation: 432 R Axis:   54 Text Interpretation:  Normal sinus rhythm Nonspecific T wave abnormality Confirmed by [place] ([id number]) on 07/22/2016 3:45:10 AM       Radiology Dg Chest 2 View  Result Date: 07/22/2016 CLINICAL DATA:  Central chest pain. EXAM: CHEST  2 VIEW COMPARISON:  Radiograph 08/26/2010 FINDINGS: The cardiomediastinal contours are normal. The lungs are clear. Pulmonary vasculature is normal. No consolidation, pleural  effusion, or pneumothorax. No acute osseous abnormalities are seen. IMPRESSION: No acute pulmonary process. Electronically Signed   By: Jeb Levering M.D.   On: 07/22/2016 01:58   Ct Angio Chest Pe W Or Wo Contrast  Result Date: 07/22/2016 CLINICAL DATA:  Chest pain.  History of DVT. EXAM: CT ANGIOGRAPHY CHEST WITH CONTRAST TECHNIQUE: Multidetector CT imaging of the chest was performed using the standard protocol during bolus administration of intravenous contrast. Multiplanar CT image reconstructions and MIPs were obtained to evaluate the vascular anatomy. CONTRAST:  100 cc Isovue 370 IV COMPARISON:  Chest radiograph earlier this day.  Chest CT 09/16/2008 FINDINGS: Cardiovascular: There are no filling defects within the pulmonary arteries to suggest pulmonary embolus. No aortic dissection. Moderate atherosclerosis of the thoracic aorta. Coronary artery calcifications. Heart is normal in size. No pericardial effusion. Mediastinum/Nodes: No mediastinal, hilar, or axillary adenopathy. Visualized thyroid gland is normal.  The esophagus is decompressed. Lungs/Pleura: No consolidation or pulmonary edema. No pleural fluid. 6 mm pleural-based nodule in the left lower lobe, stable from remote prior exam and benign. Upper Abdomen: Unchanged low-density right adrenal nodule, likely an adenoma. No acute abnormality. Musculoskeletal: There are no acute or suspicious osseous abnormalities. Review of the MIP images confirms the above findings. IMPRESSION: No pulmonary embolus.  No acute intrathoracic abnormality. Aortic atherosclerosis.  Coronary artery calcifications. Electronically Signed   By: Jeb Levering M.D.   On: 07/22/2016 05:00    Procedures Procedures (including critical care time)  Medications Ordered in ED Medications  acetaminophen (TYLENOL) tablet 1,000 mg (1,000 mg Oral Given 07/22/16 0410)  iopamidol (ISOVUE-370) 76 % injection (100 mLs  Contrast Given 07/22/16 0446)     Initial Impression /  Assessment and Plan / ED Course  I have reviewed the triage vital signs and the nursing notes.  Pertinent labs & imaging results that were available during my care of the patient were reviewed by me and considered in my medical decision making (see chart for details).     Atypical chest pain. EKG without acute ischemic changes or evidence of pericarditis. Initial troponin negative. HEAR <4. Chest x-ray without evidence suggestive of pneumonia, pneumothorax, pneumomediastinum.  No abnormal contour of the mediastinum to suggest dissection. No evidence of acute injuries.  He is given recent DVT, CT angios of the chest was obtained to rule out pulmonary embolism which was negative. Presentation is classic for aortic dissection or esophageal perforation.  Delta trop negative.   Non focal neuro exam. No recent head trauma. No fever. Doubt meningitis. Doubt intracranial bleed. Doubt IIH. No indication for imaging. Significant headache relief with tylenol.  The patient is safe for discharge with strict return precautions.   Final Clinical Impressions(s) / ED Diagnoses   Final diagnoses:  Nonspecific chest pain  Bad headache   Disposition: Discharge  Condition: Good  I have discussed the results, Dx and Tx plan with the patient who expressed understanding and agree(s) with the plan. Discharge instructions discussed at great length. The patient was given strict return precautions who verbalized understanding of the instructions. No further questions at time of discharge.    New Prescriptions   ACETAMINOPHEN (TYLENOL) 500 MG TABLET    Take 2 tablets (1,000 mg total) by mouth every 8 (eight) hours as needed. Do not take more than 4000 mg of acetaminophen (Tylenol) in a 24-hour period. Please note that other medicines that you may be prescribed may have Tylenol as well.    Follow Up: Alonna Buckler, MD [place] [address] [phone]  Schedule an appointment  as soon as possible for a visit  in 3-5 days, If symptoms do not improve or  worsen   I personally performed the services described in this documentation, which was scribed in my presence. The recorded information has been reviewed and is accurate.        Fatima Blank, MD 07/22/16 [phone]

## 2019-09-10 DIAGNOSIS — Z1231 Encounter for screening mammogram for malignant neoplasm of breast: Secondary | ICD-10-CM | POA: Diagnosis not present

## 2020-07-26 ENCOUNTER — Encounter: Payer: Self-pay | Admitting: Family Medicine

## 2020-07-26 ENCOUNTER — Ambulatory Visit (INDEPENDENT_AMBULATORY_CARE_PROVIDER_SITE_OTHER): Payer: BC Managed Care – PPO | Admitting: Family Medicine

## 2020-07-26 ENCOUNTER — Other Ambulatory Visit: Payer: Self-pay

## 2020-07-26 VITALS — BP 142/100 | HR 88 | Temp 98.1°F | Ht 66.0 in | Wt 198.0 lb

## 2020-07-26 DIAGNOSIS — M7989 Other specified soft tissue disorders: Secondary | ICD-10-CM

## 2020-07-26 DIAGNOSIS — Z13228 Encounter for screening for other metabolic disorders: Secondary | ICD-10-CM | POA: Diagnosis not present

## 2020-07-26 DIAGNOSIS — Z114 Encounter for screening for human immunodeficiency virus [HIV]: Secondary | ICD-10-CM

## 2020-07-26 DIAGNOSIS — I10 Essential (primary) hypertension: Secondary | ICD-10-CM

## 2020-07-26 DIAGNOSIS — Z1159 Encounter for screening for other viral diseases: Secondary | ICD-10-CM | POA: Diagnosis not present

## 2020-07-26 DIAGNOSIS — L03115 Cellulitis of right lower limb: Secondary | ICD-10-CM

## 2020-07-26 DIAGNOSIS — F32A Depression, unspecified: Secondary | ICD-10-CM

## 2020-07-26 DIAGNOSIS — Z1211 Encounter for screening for malignant neoplasm of colon: Secondary | ICD-10-CM

## 2020-07-26 DIAGNOSIS — I82401 Acute embolism and thrombosis of unspecified deep veins of right lower extremity: Secondary | ICD-10-CM

## 2020-07-26 DIAGNOSIS — M359 Systemic involvement of connective tissue, unspecified: Secondary | ICD-10-CM | POA: Diagnosis not present

## 2020-07-26 DIAGNOSIS — I739 Peripheral vascular disease, unspecified: Secondary | ICD-10-CM

## 2020-07-26 LAB — CBC WITH DIFFERENTIAL/PLATELET
Eos: 1 %
Hemoglobin: 15 g/dL (ref 11.1–15.9)
Immature Grans (Abs): 0 10*3/uL (ref 0.0–0.1)
Platelets: 246 10*3/uL (ref 150–450)

## 2020-07-26 LAB — CMP14+EGFR

## 2020-07-26 LAB — HEPATITIS C ANTIBODY

## 2020-07-26 MED ORDER — VALSARTAN 80 MG PO TABS: 80.0000 mg | ORAL_TABLET | Freq: Every day | ORAL | 3 refills | Status: AC

## 2020-07-26 MED ORDER — CITALOPRAM HYDROBROMIDE 20 MG PO TABS: 20.0000 mg | ORAL_TABLET | Freq: Every day | ORAL | 3 refills | Status: AC

## 2020-07-26 MED ORDER — HYDROCHLOROTHIAZIDE 25 MG PO TABS: 25.0000 mg | ORAL_TABLET | Freq: Every day | ORAL | 3 refills | Status: DC

## 2020-07-26 MED ORDER — CEPHALEXIN 500 MG PO CAPS: 500.0000 mg | ORAL_CAPSULE | Freq: Four times a day (QID) | ORAL | 0 refills | Status: DC

## 2020-07-26 NOTE — Patient Instructions (Addendum)
Cellulitis, Adult  Cellulitis is a skin infection. The infected area is often warm, red, swollen, and sore. It occurs most often in the arms and lower legs. It is very important to get treated for this condition. What are the causes? This condition is caused by bacteria. The bacteria enter through a break in the skin, such as a cut, burn, insect bite, open sore, or crack. What increases the risk? This condition is more likely to occur in people who:  Have a weak body defense system (immune system).  Have open cuts, burns, bites, or scrapes on the skin.  Are older than 63 years of age.  Have a blood sugar problem (diabetes).  Have a long-lasting (chronic) liver disease (cirrhosis) or kidney disease.  Are very overweight (obese).  Have a skin problem, such as: ? Itchy rash (eczema). ? Slow movement of blood in the veins (venous stasis). ? Fluid buildup below the skin (edema).  Have been treated with high-energy rays (radiation).  Use IV drugs. What are the signs or symptoms? Symptoms of this condition include:  Skin that is: ? Red. ? Streaking. ? Spotting. ? Swollen. ? Sore or painful when you touch it. ? Warm.  A fever.  Chills.  Blisters. How is this diagnosed? This condition is diagnosed based on:  Medical history.  Physical exam.  Blood tests.  Imaging tests. How is this treated? Treatment for this condition may include:  Medicines to treat infections or allergies.  Home care, such as: ? Rest. ? Placing cold or warm cloths (compresses) on the skin.  Hospital care, if the condition is very bad. Follow these instructions at home: Medicines  Take over-the-counter and prescription medicines only as told by your doctor.  If you were prescribed an antibiotic medicine, take it as told by your doctor. Do not stop taking it even if you start to feel better. General instructions  Drink enough fluid to keep your pee (urine) pale yellow.  Do not  touch or rub the infected area.  Raise (elevate) the infected area above the level of your heart while you are sitting or lying down.  Place cold or warm cloths on the area as told by your doctor.  Keep all follow-up visits as told by your doctor. This is important.   Contact a doctor if:  You have a fever.  You do not start to get better after 1-2 days of treatment.  Your bone or joint under the infected area starts to hurt after the skin has healed.  Your infection comes back. This can happen in the same area or another area.  You have a swollen bump in the area.  You have new symptoms.  You feel ill and have muscle aches and pains. Get help right away if:  Your symptoms get worse.  You feel very sleepy.  You throw up (vomit) or have watery poop (diarrhea) for a long time.  You see red streaks coming from the area.  Your red area gets larger.  Your red area turns dark in color. These symptoms may represent a serious problem that is an emergency. Do not wait to see if the symptoms will go away. Get medical help right away. Call your local emergency services (911 in the [place] Do not drive yourself to the hospital. Summary  Cellulitis is a skin infection. The area is often warm, red, swollen, and sore.  This condition is treated with medicines, rest, and cold and warm cloths.  Take all  medicines only as told by your doctor.  Tell your doctor if symptoms do not start to get better after 1-2 days of treatment. This information is not intended to replace advice given to you by your health care provider. Make sure you discuss any questions you have with your health care provider. Document Revised: 10/24/2017 Document Reviewed: 10/24/2017 Elsevier Patient Education  [employer].   Peripheral Vascular Disease  Peripheral vascular disease (PVD) is a disease of the blood vessels that carry blood from the heart to the rest of the body. PVD is also called peripheral  artery disease (PAD) or poor circulation. PVD affects most of the body. But it affects the legs and feet the most. PVD can lead to acute limb ischemia. This happens when there is a sudden stop of blood flow to an arm or leg. This is a medical emergency. What are the causes? The most common cause of PVD is a buildup of a fatty substance (plaque) inside your arteries. This decreases blood flow. Plaque can break off and block blood in a smaller artery. This can lead to acute limb ischemia. Other common causes of PVD include:  Blood clots inside the blood vessels.  Injuries to blood vessels.  Irritation and swelling of blood vessels.  Sudden tightening of the blood vessel (spasms). What increases the risk?  A family history of PVD.  Medical conditions, including: ? High cholesterol. ? Diabetes. ? High blood pressure. ? Heart disease. ? Past problems with blood clots. ? Past injury, such as burns or a broken bone.  Other conditions, such as: ? Buerger's disease. This is caused by swollen or irritated blood vessels in your hands and feet. ? Arthritis. ? Birth defects that affect the arteries in your legs. ? Kidney disease.  Using tobacco or nicotine products.  Not getting enough exercise.  Being very overweight (obese).  Being 32 years old or older. What are the signs or symptoms?  Cramps in your butt, legs, and feet.  Pain and weakness in your legs when you are active that goes away when you rest.  Leg pain when at rest.  Leg numbness, tingling, or weakness.  Coldness in a leg or foot, especially when compared with the other leg or foot.  Skin or hair changes. These can include: ? Hair loss. ? Shiny skin. ? Pale or bluish skin. ? Thick toenails.  Being unable to get or keep an erection.  Tiredness (fatigue).  Weak pulse or no pulse in the feet.  Wounds and sores on the toes, feet, or legs. These take longer to heal. How is this treated? Underlying causes are  treated first. Other conditions, like diabetes, high cholesterol, and blood pressure, are also treated. Treatment may include:  Lifestyle changes, such as: ? Quitting smoking. ? Getting regular exercise. ? Having a diet low in fat and cholesterol. ? Not drinking alcohol.  Taking medicines, such as: ? Blood thinners. ? Medicines to improve blood flow. ? Medicines to improve your blood cholesterol.  Procedures to: ? Open the arteries and restore blood flow. ? Insert a small mesh tube (stent) to keep a blocked vessel open. ? Create a new path for blood to flow to the body (peripheral bypass). ? Remove dead tissue from a wound. ? Remove an affected leg or arm. Follow these instructions at home: Medicines  Take over-the-counter and prescription medicines only as told by your doctor.  If you are taking blood thinners: ? Talk with your doctor before you  take any medicines that have aspirin, or NSAIDs, such as ibuprofen. ? Take medicines exactly as told. Take them at the same time each day. ? Avoid doing things that could hurt or bruise you. Take action to prevent falls. ? Wear an alert bracelet or carry a card that shows you are taking blood thinners. Lifestyle  Get regular exercise. Ask your doctor about how to stay active.  Talk with your doctor about keeping a healthy weight. If needed, ask about losing weight.  Eat a diet that is low in fat and cholesterol. If you need help, talk with your doctor.  Do not drink alcohol.  Do not smoke or use any products that contain nicotine or tobacco. If you need help quitting, ask your doctor.      General instructions  Take good care of your feet. To do this: ? Wear shoes that fit well and feel good. ? Check your feet often for any cuts or sores.  Get a flu shot (influenza vaccine) each year.  Keep all follow-up visits. Where to find more information  Society for Vascular Surgery: vascular.org  American Heart Association:  heart.org  National Heart, Lung, and Blood Institute: https://www.hartman-hill.biz/ Contact a doctor if:  You have cramps in your legs when you walk.  You have leg pain when you rest.  Your leg or foot feels cold.  Your skin changes.  You cannot get or keep an erection.  You have cuts or sores on your legs or feet that do not heal. Get help right away if:  You have sudden changes in the color and feeling of your arms or legs, such as: ? Your arm or leg turns cold, numb, and blue. ? Your arm or leg becomes red, warm, swollen, painful, or numb.  You have any signs of a stroke. "BE FAST" is an easy way to remember the main warning signs: ? B - Balance. Dizziness, sudden trouble walking, or loss of balance. ? E - Eyes. Trouble seeing or a change in how you see. ? F - Face. Sudden weakness or loss of feeling of the face. The face or eyelid may droop on one side. ? A - Arms. Weakness or loss of feeling in an arm. This happens all of a sudden and most often on one side of the body. ? S - Speech. Sudden trouble speaking, slurred speech, or trouble understanding what people say. ? T - Time. Time to call emergency services. Write down what time symptoms started.  You have other signs of a stroke, such as: ? A sudden, very bad headache with no known cause. ? Feeling like you may vomit (nausea). ? Vomiting. ? A seizure.  You have chest pain or trouble breathing. These symptoms may be an emergency. Get help right away. Call your local emergency services (911 in the [place]  Do not wait to see if the symptoms will go away.  Do not drive yourself to the hospital. Summary  Peripheral vascular disease (PVD) is a disease of the blood vessels.  PVD affects the legs and feet the most.  Symptoms may include leg pain or leg numbness, tingling, and weakness.  Treatment may include lifestyle changes, medicines, and procedures. This information is not intended to replace advice given to you by your health  care provider. Make sure you discuss any questions you have with your health care provider. Document Revised: 12/07/2019 Document Reviewed: 12/07/2019 Elsevier Patient Education  2021 [employer].  If you have lab work  done today you will be contacted with your lab results within the next 2 weeks.  If you have not heard from [place] then please contact us. The fastest way to get your results is to register for My Chart.   IF you received an x-ray today, you will receive an invoice from [facility]. Please contact [facility] at [phone] with questions or concerns regarding your invoice.   IF you received labwork today, you will receive an invoice from [facility]. Please contact [facility] at [phone] with questions or concerns regarding your invoice.   Our billing staff will not be able to assist you with questions regarding bills from these companies.  You will be contacted with the lab results as soon as they are available. The fastest way to get your results is to activate your My Chart account. Instructions are located on the last page of this paperwork. If you have not heard from [place] regarding the results in 2 weeks, please contact this office.

## 2020-07-26 NOTE — Progress Notes (Signed)
2/8/20222:03 PM  [name] March 31, 1956, 65 y.o., female [id number]  Chief Complaint  Patient presents with  . R leg pain     Leg pain started 3 days ago, feels hot to touch, redness, and pain     HPI:   Patient is a 65 y.o. female with past medical history significant for prev DVT and depression who presents today for Right leg pain.   Right leg pain started Sunday  Now feels a knot Discoloration is normal Has a past history of DVT No recent travel, doesn't smoke, Picture in chart Can't take gabapentin Has worn compression socks in the past Long history of Spider veins   HLD Atorvastatin 8m daily Lab Results  Component Value Date   CHOL  09/16/2008    159        ATP III CLASSIFICATION:  <200     mg/dL   Desirable  200-239  mg/dL   Borderline High  >=240    mg/dL   High          HDL 34 (L) 09/16/2008   LDLCALC (H) 09/16/2008    107        Total Cholesterol/HDL:CHD Risk Coronary Heart Disease Risk Table                     Men   Women  1/2 Average Risk   3.4   3.3  Average Risk       5.0   4.4  2 X Average Risk   9.6   7.1  3 X Average Risk  23.4   11.0        Use the calculated Patient Ratio above and the CHD Risk Table to determine the patient's CHD Risk.        ATP III CLASSIFICATION (LDL):  <100     mg/dL   Optimal  100-129  mg/dL   Near or Above                    Optimal  130-159  mg/dL   Borderline  160-189  mg/dL   High  >190     mg/dL   Very High   TRIG 91 09/16/2008   CHOLHDL 4.7 09/16/2008   HTN Metoprolol 21mdaily Stress test in the past Known heart blockage BP Readings from Last 3 Encounters:  07/26/20 (!) 142/100  07/22/16 125/65  07/02/16 155/78    Needs Quit smoking 2017  PaP: : 6-8 years ago Mammogram: Last year 2021 Dexa: will start at [place] cancer screen:  Flu vaccine: Declines  Health Maintenance  Topic Date Due  . COVID-19 Vaccine (1) Never done  . PAP SMEAR-Modifier  Never done  . COLONOSCOPY  (Pts [id number] coverage will need to be confirmed)  Never done  . MAMMOGRAM  Never done  . INFLUENZA VACCINE  Never done  . TETANUS/TDAP  10/19/2025  . Hepatitis C Screening  Completed  . HIV Screening  Completed   Depression Has been struggling with depression since husband passed away 14 20ars ago Has 2 kids, but neither live nearby Her brother lives nearby but he had a stroke Took Celexa in the past but stopped that quite some time ago Extremely tearful during appointment   [facility] 07/26/2020 10/20/2015 11/30/2014  Decreased Interest 0 0 0  Down, Depressed, Hopeless 0 0 0  PHQ - 2 Score 0 0 0    Fall Risk  07/26/2020 10/20/2015  Falls in the past year? 0 No  Number falls in past yr: 0 -  Injury with Fall? 0 -  Follow up Falls evaluation completed -     Allergies  Allergen Reactions  . Penicillins Shortness Of Breath, Swelling and Other (See Comments)    Tongue swelling   . Shellfish-Derived Products Shortness Of Breath and Swelling    tongue swelling   . Morphine And Related Itching and Other (See Comments)    hallucinations     Prior to Admission medications   Medication Sig Start Date End Date Taking? Authorizing Provider  atorvastatin (LIPITOR) 20 MG tablet Take 20 mg by mouth daily. 06/22/20  Yes [provider]  metoprolol succinate (TOPROL-XL) 25 MG 24 hr tablet Take 25 mg by mouth daily. 06/22/20  Yes [provider]  mupirocin ointment (BACTROBAN) 2 % Apply daily and cover the wound for the next five days. Patient not taking: Reported on 07/26/2020 10/20/15   [place]    Past Medical History:  Diagnosis Date  . DVT (deep venous thrombosis) ([place])   . Hypercholesteremia   . Hypertension   . Renal disorder    kidney stones    Past Surgical History:  Procedure Laterality Date  . ABDOMINAL HYSTERECTOMY    . BREAST LUMPECTOMY    . BUNIONECTOMY      Social History   Tobacco Use  . Smoking status: Former  Smoker    Packs/day: 1.00    Years: 42.00    Pack years: 42.00    Types: Cigarettes    Quit date: 07/27/2015    Years since quitting: 5.0  . Smokeless tobacco: Never Used  Substance Use Topics  . Alcohol use: Yes    Alcohol/week: 0.0 standard drinks    Comment: occasional    Family History  Problem Relation Age of Onset  . Lupus Mother   . Parkinson's disease Mother   . Cancer Father   . Cancer Sister   . Cancer Brother     ROS   OBJECTIVE:  Today's Vitals   07/26/20 0832 07/26/20 0941  BP: (!) 189/85 (!) 142/100  Pulse: 88   Temp: 98.1 F (36.7 C)   SpO2: 98%   Weight: 198 lb (89.8 kg)   Height: 5' 6" (1.676 m)    Body mass index is 31.96 kg/m.   Physical Exam Constitutional:      General: She is not in acute distress.    Appearance: Normal appearance. She is not ill-appearing.  HENT:     Head: Normocephalic.  Cardiovascular:     Rate and Rhythm: Normal rate and regular rhythm.     Pulses: Normal pulses.     Heart sounds: Normal heart sounds. No murmur heard. No friction rub. No gallop.   Pulmonary:     Effort: Pulmonary effort is normal. No respiratory distress.     Breath sounds: Normal breath sounds. No stridor. No wheezing, rhonchi or rales.  Abdominal:     General: Bowel sounds are normal.     Palpations: Abdomen is soft.     Tenderness: There is no abdominal tenderness.  Musculoskeletal:     Right lower leg: Edema present.     Left lower leg: Edema present.  Skin:    General: Skin is warm and dry.     Comments: See pictures in chart of right leg  Neurological:     Mental Status: She is alert and oriented to person, place, and time.  Psychiatric:  Mood and Affect: Mood is depressed. Affect is tearful.        Behavior: Behavior normal.     No results found for this or any previous visit (from the past 24 hour(s)).  No results found.   ASSESSMENT and PLAN  Problem List Items Addressed This Visit      Cardiovascular and  Mediastinum   Acute thromboembolism of deep veins of right lower extremity (HCC) - Primary   Relevant Medications   atorvastatin (LIPITOR) 20 MG tablet   metoprolol succinate (TOPROL-XL) 25 MG 24 hr tablet   hydrochlorothiazide (HYDRODIURIL) 25 MG tablet   valsartan (DIOVAN) 80 MG tablet   Other Relevant Orders   CBC with Differential   CK   D-dimer, quantitative (not at [facility])    Other Visit Diagnoses    Diffuse connective tissue disease (HCC)   (Chronic)     Encounter for hepatitis C screening test for low risk patient       Relevant Orders   Hepatitis C antibody   Screening for HIV (human immunodeficiency virus)       Relevant Orders   HIV Antibody (routine testing w rflx)   Screening for metabolic disorder       Relevant Orders   CMP14+EGFR   TSH   Vitamin D, 25-hydroxy   Hemoglobin A1c   PVD (peripheral vascular disease) (HCC)       Relevant Medications   atorvastatin (LIPITOR) 20 MG tablet   metoprolol succinate (TOPROL-XL) 25 MG 24 hr tablet   hydrochlorothiazide (HYDRODIURIL) 25 MG tablet   valsartan (DIOVAN) 80 MG tablet   Other Relevant Orders   Ambulatory referral to Vascular Surgery   Cellulitis of right lower extremity       Relevant Medications   cephALEXin (KEFLEX) 500 MG capsule   Screening for colon cancer       Relevant Orders   Ambulatory referral to Gastroenterology   Depression, unspecified depression type       Relevant Medications   citalopram (CELEXA) 20 MG tablet   Essential hypertension       Relevant Medications   atorvastatin (LIPITOR) 20 MG tablet   metoprolol succinate (TOPROL-XL) 25 MG 24 hr tablet   hydrochlorothiazide (HYDRODIURIL) 25 MG tablet   valsartan (DIOVAN) 80 MG tablet   Swelling of right lower extremity       Relevant Orders   VAS [place] LOWER EXTREMITY VENOUS (DVT)       Plan . Right leg [place]: referral to vascular surgery sent . Starting Keflex for possible cellulitis . Starting HCTZ and valsartan for BP along with  metoprolol, goal< 130/80 . Refills sent for atorvastatin . Will follow up with lab results . Referral to GI for colonoscopy . Starting Celexa for depression   Return in about 1 week (around 08/02/2020).    Huston Foley , FNP-BC [facility] at [place], [place] [address] Ph.  [phone] Fax [phone]

## 2020-07-27 ENCOUNTER — Telehealth: Payer: Self-pay | Admitting: Family Medicine

## 2020-07-27 LAB — CMP14+EGFR
ALT: 25 IU/L (ref 0–32)
AST: 18 IU/L (ref 0–40)
Albumin/Globulin Ratio: 1.8 (ref 1.2–2.2)
Albumin: 4.6 g/dL (ref 3.8–4.8)
Alkaline Phosphatase: 117 IU/L (ref 44–121)
BUN/Creatinine Ratio: 20 (ref 12–28)
BUN: 15 mg/dL (ref 8–27)
Bilirubin Total: 1.1 mg/dL (ref 0.0–1.2)
CO2: 19 mmol/L — ABNORMAL LOW (ref 20–29)
Calcium: 9.6 mg/dL (ref 8.7–10.3)
Chloride: 102 mmol/L (ref 96–106)
Creatinine, Ser: 0.74 mg/dL (ref 0.57–1.00)
GFR calc Af Amer: 100 mL/min/{1.73_m2} (ref >59)
GFR calc non Af Amer: 87 mL/min/{1.73_m2} (ref >59)
Potassium: 4.1 mmol/L (ref 3.5–5.2)
Sodium: 140 mmol/L (ref 134–144)
Total Protein: 7.1 g/dL (ref 6.0–8.5)

## 2020-07-27 LAB — CBC WITH DIFFERENTIAL/PLATELET
Basophils Absolute: 0.1 10*3/uL (ref 0.0–0.2)
Basos: 1 %
EOS (ABSOLUTE): 0.1 10*3/uL (ref 0.0–0.4)
Hematocrit: 43.9 % (ref 34.0–46.6)
Immature Granulocytes: 0 %
Lymphocytes Absolute: 1 10*3/uL (ref 0.7–3.1)
Lymphs: 17 %
MCH: 30.4 pg (ref 26.6–33.0)
MCHC: 34.2 g/dL (ref 31.5–35.7)
MCV: 89 fL (ref 79–97)
Monocytes Absolute: 0.5 10*3/uL (ref 0.1–0.9)
Monocytes: 8 %
Neutrophils Absolute: 4 10*3/uL (ref 1.4–7.0)
Neutrophils: 73 %
RBC: 4.94 x10E6/uL (ref 3.77–5.28)
RDW: 12.7 % (ref 11.7–15.4)
WBC: 5.6 10*3/uL (ref 3.4–10.8)

## 2020-07-27 LAB — HEMOGLOBIN A1C
Est. average glucose Bld gHb Est-mCnc: 126 mg/dL
Hgb A1c MFr Bld: 6 % — ABNORMAL HIGH (ref 4.8–5.6)

## 2020-07-27 LAB — HIV ANTIBODY (ROUTINE TESTING W REFLEX): HIV Screen 4th Generation wRfx: NONREACTIVE

## 2020-07-27 LAB — TSH: TSH: 1.31 u[IU]/mL (ref 0.450–4.500)

## 2020-07-27 LAB — D-DIMER, QUANTITATIVE: D-DIMER: 0.55 mg/L FEU — ABNORMAL HIGH (ref 0.00–0.49)

## 2020-07-27 LAB — CK: Total CK: 110 U/L (ref 32–182)

## 2020-07-27 LAB — VITAMIN D 25 HYDROXY (VIT D DEFICIENCY, FRACTURES): Vit D, 25-Hydroxy: 31.1 ng/mL (ref 30.0–100.0)

## 2020-07-27 NOTE — Telephone Encounter (Signed)
Patient stated she thought provider was supposed to refer her for an imaging test to see if she has a blood clot in her leg but she's not sure. Patient wants to know how long process will take; patient hasn't received call from imaging provider's office and she's concerned.  Please advise [facility] at [phone].

## 2020-07-28 NOTE — Telephone Encounter (Signed)
Called patient and informed [place] was [place] and they should be contacting her from [facility] [place] location.

## 2020-08-02 ENCOUNTER — Encounter: Payer: Self-pay | Admitting: Family Medicine

## 2020-08-02 ENCOUNTER — Other Ambulatory Visit: Payer: Self-pay

## 2020-08-02 ENCOUNTER — Ambulatory Visit (INDEPENDENT_AMBULATORY_CARE_PROVIDER_SITE_OTHER): Payer: BC Managed Care – PPO | Admitting: Family Medicine

## 2020-08-02 VITALS — BP 144/78 | HR 57 | Temp 98.0°F | Ht 66.0 in | Wt 198.0 lb

## 2020-08-02 DIAGNOSIS — I739 Peripheral vascular disease, unspecified: Secondary | ICD-10-CM | POA: Diagnosis not present

## 2020-08-02 DIAGNOSIS — F32A Depression, unspecified: Secondary | ICD-10-CM

## 2020-08-02 DIAGNOSIS — Z23 Encounter for immunization: Secondary | ICD-10-CM

## 2020-08-02 NOTE — Patient Instructions (Addendum)
Continue taking your BP at home If your noticing that the top number continues to be greater than 130 after the next 2 weeks    How to Use Compression Stockings Compression stockings are elastic socks that squeeze the legs. They help increase blood flow (circulation) to the legs, decrease swelling in the legs, and reduce the chance of developing blood clots in the lower legs. Compression stockings are often used by people who:  Are recovering from surgery.  Have poor circulation in their legs.  Tend to get blood clots in their legs.  Have bulging (varicose) veins.  Sit or stay in bed for long periods of time. Follow instructions from your health care provider about how and when to wear your compression stockings. How to wear compression stockings Before you put on your compression stockings:  Make sure that they are the correct size and degree of compression. If you do not know your size or required grade of compression, ask your health care provider and follow the manufacturer's instructions that come with the stockings.  Make sure that they are clean, dry, and in good condition.  Check them for rips and tears. Do not put them on if they are ripped or torn. Put your stockings on first thing in the morning, before you get out of bed. Keep them on for as long as your health care provider advises. When you are wearing your stockings:  Keep them as smooth as possible. Do not allow them to bunch up. It is especially important to prevent the stockings from bunching up around your toes or behind your knees.  Do not roll the stockings downward and leave them rolled down. This can decrease blood flow to your leg.  Change them right away if they become wet or dirty. When you take off your stockings, inspect your legs and feet. Check for:  Open sores.  Red spots.  Swelling. General tips  Do not stop wearing compression stockings without talking to your health care provider  first.  Wash your stockings every day with mild detergent in cold or warm water. Do not use bleach. Air-dry your stockings or dry them in a clothes dryer on low heat. It may be helpful to have two pairs so that you have a pair to wear while the other is being washed.  Replace your stockings every 3-6 months.  If skin moisturizing is part of your treatment plan, apply lotion or cream at night so that your skin will be dry when you put on the stockings in the morning. It is harder to put the stockings on when you have lotion on your legs or feet.  Wear nonskid shoes or slip-resistant socks when walking while wearing compression stockings. Contact a health care provider and remove your stockings if you have:  A feeling of pins and needles in your feet or legs.  Open sores, red spots, or other skin changes on your feet or legs.  Swelling or pain that gets worse. Get help right away if you have:  Numbness or tingling in your lower legs that does not get better right after you take the stockings off.  Toes or feet that are unusually cold or turn a bluish color.  A warm or red area on your leg.  New swelling or soreness in your leg.  Shortness of breath.  Chest pain.  A fast or irregular heartbeat.  Light-headedness.  Dizziness. Summary  Compression stockings are elastic socks that squeeze the legs.  They help  increase blood flow (circulation) to the legs, decrease swelling in the legs, and reduce the chance of developing blood clots in the lower legs.  Follow instructions from your health care provider about how and when to wear your compression stockings.  Do not stop wearing your compression stockings without talking to your health care provider first. This information is not intended to replace advice given to you by your health care provider. Make sure you discuss any questions you have with your health care provider. Document Revised: 10/07/2019 Document Reviewed:  10/07/2019 Elsevier Patient Education  2021 [employer].   If you have lab work done today you will be contacted with your lab results within the next 2 weeks.  If you have not heard from [place] then please contact us. The fastest way to get your results is to register for My Chart.   IF you received an x-ray today, you will receive an invoice from [facility]. Please contact [facility] at [phone] with questions or concerns regarding your invoice.   IF you received labwork today, you will receive an invoice from [place] [facility]. Please contact [facility] at [phone] with questions or concerns regarding your invoice.   Our billing staff will not be able to assist you with questions regarding bills from these companies.  You will be contacted with the lab results as soon as they are available. The fastest way to get your results is to activate your My Chart account. Instructions are located on the last page of this paperwork. If you have not heard from [place] regarding the results in 2 weeks, please contact this office.

## 2020-08-02 NOTE — Progress Notes (Signed)
2/15/20224:27 PM  [name] 1956-01-01, 65 y.o., female [id number]  Chief Complaint  Patient presents with  . pain in lower R leg     March 15 appt w/ vascular - leg is feeling better [facility] tender    HPI:   Patient is a 65 y.o. female with past medical history significant for prev DVT and depression who presents today for routine followup.  Right leg [place] ordered vascular surgery appt 3/15 Completed Keflex for possible cellulitis Leg still tender but feels better and less warm Still able to walk on leg    HTN Started HCTZ and valsartan for BP along with metoprolol last OV BP goal< 130/80 Took first Valsartan Wed, HCTZ daily Is noticed increased urination and some fatigue   Referral to GI for colonoscopy last Ov  Started Celexa for depression last OV Has noticed an improvement already Denies issues of nausea   Health Maintenance  Topic Date Due  . COVID-19 Vaccine (1) Never done  . PAP SMEAR-Modifier  Never done  . COLONOSCOPY (Pts 45-71yrs Insurance coverage will need to be confirmed)  Never done  . MAMMOGRAM  08/16/2020 (Originally 08/15/2007)  . TETANUS/TDAP  10/19/2025  . INFLUENZA VACCINE  Completed  . Hepatitis C Screening  Completed  . HIV Screening  Completed     Depression screen [facility] 2/9 08/02/2020 07/26/2020 10/20/2015  Decreased Interest 0 0 0  Down, Depressed, Hopeless 0 0 0  PHQ - 2 Score 0 0 0    Fall Risk  08/02/2020 07/26/2020 10/20/2015  Falls in the past year? 0 0 No  Number falls in past yr: 0 0 -  Injury with Fall? 0 0 -  Follow up Falls evaluation completed Falls evaluation completed -     Allergies  Allergen Reactions  . Penicillins Shortness Of Breath, Swelling and Other (See Comments)    Tongue swelling   . Shellfish-Derived Products Shortness Of Breath and Swelling    tongue swelling   . Morphine And Related Itching and Other (See Comments)    hallucinations     Prior to Admission medications   Medication Sig Start Date  End Date Taking? Authorizing Provider  atorvastatin (LIPITOR) 20 MG tablet Take 20 mg by mouth daily. 06/22/20  Yes [provider]  metoprolol succinate (TOPROL-XL) 25 MG 24 hr tablet Take 25 mg by mouth daily. 06/22/20  Yes [provider]  mupirocin ointment (BACTROBAN) 2 % Apply daily and cover the wound for the next five days. Patient not taking: Reported on 07/26/2020 10/20/15   [place]    Past Medical History:  Diagnosis Date  . DVT (deep venous thrombosis) ([place])   . Hypercholesteremia   . Hypertension   . Renal disorder    kidney stones    Past Surgical History:  Procedure Laterality Date  . ABDOMINAL HYSTERECTOMY    . BREAST LUMPECTOMY    . BUNIONECTOMY      Social History   Tobacco Use  . Smoking status: Former Smoker    Packs/day: 1.00    Years: 42.00    Pack years: 42.00    Types: Cigarettes    Quit date: 07/27/2015    Years since quitting: 5.0  . Smokeless tobacco: Never Used  Substance Use Topics  . Alcohol use: Yes    Alcohol/week: 0.0 standard drinks    Comment: occasional    Family History  Problem Relation Age of Onset  . Lupus Mother   . Parkinson's disease Mother   .  Cancer Father   . Cancer Sister   . Cancer Brother     Review of Systems  Constitutional: Negative for chills, fever and malaise/fatigue.  Eyes: Negative for blurred vision and double vision.  Respiratory: Negative for cough, shortness of breath and wheezing.   Cardiovascular: Negative for chest pain, palpitations and leg swelling.  Gastrointestinal: Negative for abdominal pain, blood in stool, constipation, diarrhea, heartburn, nausea and vomiting.  Genitourinary: Negative for dysuria, frequency and hematuria.  Musculoskeletal: Negative for back pain and joint pain.  Skin: Negative for rash.  Neurological: Negative for dizziness, weakness and headaches.  Psychiatric/Behavioral: Negative for depression.     OBJECTIVE:  Today's Vitals   08/02/20  1553  BP: (!) 144/78  Pulse: (!) 57  Temp: 98 F (36.7 C)  SpO2: 97%  Weight: 198 lb (89.8 kg)  Height: 5\' 6"  (1.676 m)   Body mass index is 31.96 kg/m.   Physical Exam Constitutional:      General: She is not in acute distress.    Appearance: Normal appearance. She is not ill-appearing.  HENT:     Head: Normocephalic.  Cardiovascular:     Rate and Rhythm: Normal rate and regular rhythm.     Pulses: Normal pulses.     Heart sounds: Normal heart sounds. No murmur heard. No friction rub. No gallop.   Pulmonary:     Effort: Pulmonary effort is normal. No respiratory distress.     Breath sounds: Normal breath sounds. No stridor. No wheezing, rhonchi or rales.  Abdominal:     General: Bowel sounds are normal.     Palpations: Abdomen is soft.     Tenderness: There is no abdominal tenderness.  Musculoskeletal:     Right lower leg: No edema.     Left lower leg: No edema.  Skin:    General: Skin is warm and dry.  Neurological:     Mental Status: She is alert and oriented to person, place, and time.  Psychiatric:        Mood and Affect: Mood and affect normal.        Behavior: Behavior normal.     No results found for this or any previous visit (from the past 24 hour(s)).  No results found.   ASSESSMENT and PLAN  Problem List Items Addressed This Visit   None   Visit Diagnoses    PVD (peripheral vascular disease) ([place])    -  Primary   Relevant Orders   Lipid Panel   Encounter for immunization       Encounter for vaccination       Relevant Orders   Flu Vaccine QUAD 36+ mos IM (Completed)   Depression, unspecified depression type          Plan . Continue current dose of celexa . Continue current BP regimen, will follow up for BP < 130/80 . Continue to monitor right leg, continue compression socks . RTC/ED precautions provided  Return in about 4 weeks (around 08/30/2020).    Huston Foley Mily Malecki, FNP-BC Primary Care at [place], [place]  [address] Ph.  [phone] Fax [phone]

## 2020-08-03 ENCOUNTER — Other Ambulatory Visit: Payer: Self-pay | Admitting: Family Medicine

## 2020-08-03 DIAGNOSIS — E782 Mixed hyperlipidemia: Secondary | ICD-10-CM

## 2020-08-03 LAB — LIPID PANEL
Chol/HDL Ratio: 4.8 ratio — ABNORMAL HIGH (ref 0.0–4.4)
Cholesterol, Total: 197 mg/dL (ref 100–199)
HDL: 41 mg/dL (ref >39)
LDL Chol Calc (NIH): 121 mg/dL — ABNORMAL HIGH (ref 0–99)
Triglycerides: 198 mg/dL — ABNORMAL HIGH (ref 0–149)
VLDL Cholesterol Cal: 35 mg/dL (ref 5–40)

## 2020-08-03 MED ORDER — ATORVASTATIN CALCIUM 40 MG PO TABS: 40.0000 mg | ORAL_TABLET | Freq: Every day | ORAL | 3 refills | Status: DC

## 2020-08-04 ENCOUNTER — Other Ambulatory Visit: Payer: Self-pay

## 2020-08-04 ENCOUNTER — Other Ambulatory Visit: Payer: Self-pay | Admitting: Family Medicine

## 2020-08-04 ENCOUNTER — Ambulatory Visit (HOSPITAL_COMMUNITY)
Admission: RE | Admit: 2020-08-04 | Discharge: 2020-08-04 | Disposition: A | Discharge status: Home / Self Care | Payer: BC Managed Care – PPO | Source: Ambulatory Visit | Attending: Family Medicine | Admitting: Family Medicine

## 2020-08-04 DIAGNOSIS — M7989 Other specified soft tissue disorders: Secondary | ICD-10-CM | POA: Diagnosis not present

## 2020-08-04 NOTE — Progress Notes (Signed)
Hello, You will be seeing this patient 08/30/20, previous history of RLE DVT.

## 2020-08-18 ENCOUNTER — Emergency Department (HOSPITAL_COMMUNITY): Admission: EM | Admit: 2020-08-18 | Payer: BC Managed Care – PPO | Source: Home / Self Care

## 2020-08-18 ENCOUNTER — Ambulatory Visit (INDEPENDENT_AMBULATORY_CARE_PROVIDER_SITE_OTHER): Payer: BC Managed Care – PPO | Admitting: Family Medicine

## 2020-08-18 ENCOUNTER — Encounter: Payer: Self-pay | Admitting: Family Medicine

## 2020-08-18 ENCOUNTER — Other Ambulatory Visit: Payer: Self-pay

## 2020-08-18 VITALS — BP 136/74 | HR 70 | Temp 97.9°F | Ht 66.0 in | Wt 196.0 lb

## 2020-08-18 DIAGNOSIS — M79604 Pain in right leg: Secondary | ICD-10-CM | POA: Diagnosis not present

## 2020-08-18 MED ORDER — GABAPENTIN 100 MG PO CAPS: 100.0000 mg | ORAL_CAPSULE | Freq: Three times a day (TID) | ORAL | 3 refills | Status: DC

## 2020-08-18 NOTE — Patient Instructions (Addendum)
Peripheral Vascular Disease  Peripheral vascular disease (PVD) is a disease of the blood vessels that carry blood from the heart to the rest of the body. PVD is also called peripheral artery disease (PAD) or poor circulation. PVD affects most of the body. But it affects the legs and feet the most. PVD can lead to acute limb ischemia. This happens when there is a sudden stop of blood flow to an arm or leg. This is a medical emergency. What are the causes? The most common cause of PVD is a buildup of a fatty substance (plaque) inside your arteries. This decreases blood flow. Plaque can break off and block blood in a smaller artery. This can lead to acute limb ischemia. Other common causes of PVD include:  Blood clots inside the blood vessels.  Injuries to blood vessels.  Irritation and swelling of blood vessels.  Sudden tightening of the blood vessel (spasms). What increases the risk?  A family history of PVD.  Medical conditions, including: ? High cholesterol. ? Diabetes. ? High blood pressure. ? Heart disease. ? Past problems with blood clots. ? Past injury, such as burns or a broken bone.  Other conditions, such as: ? Buerger's disease. This is caused by swollen or irritated blood vessels in your hands and feet. ? Arthritis. ? Birth defects that affect the arteries in your legs. ? Kidney disease.  Using tobacco or nicotine products.  Not getting enough exercise.  Being very overweight (obese).  Being 63 years old or older. What are the signs or symptoms?  Cramps in your butt, legs, and feet.  Pain and weakness in your legs when you are active that goes away when you rest.  Leg pain when at rest.  Leg numbness, tingling, or weakness.  Coldness in a leg or foot, especially when compared with the other leg or foot.  Skin or hair changes. These can include: ? Hair loss. ? Shiny skin. ? Pale or bluish skin. ? Thick toenails.  Being unable to get or keep an  erection.  Tiredness (fatigue).  Weak pulse or no pulse in the feet.  Wounds and sores on the toes, feet, or legs. These take longer to heal. How is this treated? Underlying causes are treated first. Other conditions, like diabetes, high cholesterol, and blood pressure, are also treated. Treatment may include:  Lifestyle changes, such as: ? Quitting smoking. ? Getting regular exercise. ? Having a diet low in fat and cholesterol. ? Not drinking alcohol.  Taking medicines, such as: ? Blood thinners. ? Medicines to improve blood flow. ? Medicines to improve your blood cholesterol.  Procedures to: ? Open the arteries and restore blood flow. ? Insert a small mesh tube (stent) to keep a blocked vessel open. ? Create a new path for blood to flow to the body (peripheral bypass). ? Remove dead tissue from a wound. ? Remove an affected leg or arm. Follow these instructions at home: Medicines  Take over-the-counter and prescription medicines only as told by your doctor.  If you are taking blood thinners: ? Talk with your doctor before you take any medicines that have aspirin, or NSAIDs, such as ibuprofen. ? Take medicines exactly as told. Take them at the same time each day. ? Avoid doing things that could hurt or bruise you. Take action to prevent falls. ? Wear an alert bracelet or carry a card that shows you are taking blood thinners. Lifestyle  Get regular exercise. Ask your doctor about how to  stay active.  Talk with your doctor about keeping a healthy weight. If needed, ask about losing weight.  Eat a diet that is low in fat and cholesterol. If you need help, talk with your doctor.  Do not drink alcohol.  Do not smoke or use any products that contain nicotine or tobacco. If you need help quitting, ask your doctor.      General instructions  Take good care of your feet. To do this: ? Wear shoes that fit well and feel good. ? Check your feet often for any cuts or  sores.  Get a flu shot (influenza vaccine) each year.  Keep all follow-up visits. Where to find more information  Society for Vascular Surgery: vascular.org  American Heart Association: heart.org  National Heart, Lung, and Blood Institute: https://www.[place]man-[place].biz/ Contact a doctor if:  You have cramps in your legs when you walk.  You have leg pain when you rest.  Your leg or foot feels cold.  Your skin changes.  You cannot get or keep an erection.  You have cuts or sores on your legs or feet that do not heal. Get help right away if:  You have sudden changes in the color and feeling of your arms or legs, such as: ? Your arm or leg turns cold, numb, and blue. ? Your arm or leg becomes red, warm, swollen, painful, or numb.  You have any signs of a stroke. "BE FAST" is an easy way to remember the main warning signs: ? B - Balance. Dizziness, sudden trouble walking, or loss of balance. ? E - Eyes. Trouble seeing or a change in how you see. ? F - Face. Sudden weakness or loss of feeling of the face. The face or eyelid may droop on one side. ? A - Arms. Weakness or loss of feeling in an arm. This happens all of a sudden and most often on one side of the body. ? S - Speech. Sudden trouble speaking, slurred speech, or trouble understanding what people say. ? T - Time. Time to call emergency services. Write down what time symptoms started.  You have other signs of a stroke, such as: ? A sudden, very bad headache with no known cause. ? Feeling like you may vomit (nausea). ? Vomiting. ? A seizure.  You have chest pain or trouble breathing. These symptoms may be an emergency. Get help right away. Call your local emergency services (911 in the [place]  Do not wait to see if the symptoms will go away.  Do not drive yourself to the hospital. Summary  Peripheral vascular disease (PVD) is a disease of the blood vessels.  PVD affects the legs and feet the most.  Symptoms may include leg  pain or leg numbness, tingling, and weakness.  Treatment may include lifestyle changes, medicines, and procedures. This information is not intended to replace advice given to you by your health care provider. Make sure you discuss any questions you have with your health care provider. Document Revised: 12/07/2019 Document Reviewed: 12/07/2019 Elsevier Patient Education  2021 [employer].    If you have lab work done today you will be contacted with your lab results within the next 2 weeks.  If you have not heard from [place] then please contact us. The fastest way to get your results is to register for My Chart.   IF you received an x-ray today, you will receive an invoice from [facility]. Please contact [facility] at [phone] with questions or  concerns regarding your invoice.   IF you received labwork today, you will receive an invoice from [facility]. Please contact [facility] at [phone] with questions or concerns regarding your invoice.   Our billing staff will not be able to assist you with questions regarding bills from these companies.  You will be contacted with the lab results as soon as they are available. The fastest way to get your results is to activate your My Chart account. Instructions are located on the last page of this paperwork. If you have not heard from [place] regarding the results in 2 weeks, please contact this office.

## 2020-08-18 NOTE — Progress Notes (Signed)
3/3/20229:28 AM  [name] [birth year], 65 y.o., female [id number]  Chief Complaint  Patient presents with  . right leg pain     Got worse with bending over on stool and reaching forward over night yesterday Has some dizzyness and upset stomach     HPI:   Patient is a 65 y.o. female with past medical history significant for prev DVT and depression who presents today for routine followup.  Right leg [place] 2/17 - Findings consistent with acute superficial vein thrombosis involving the  right great saphenous vein.  - There is no evidence of deep vein thrombosis in the lower extremity.   Completed Keflex  2/8 for possible cellulitis One week post antibiotics leg was still slightly tender but less pain and normothermic Discoloration was much improved  Today leg is much more painful Pain became worse when bending over a stool yesterday Has an appointment with vascular surgery on 3/15 Was sitting on a stool cleaning the floor on Monday and Tuesday of last week Leg got sore after that Last night saw redness on her right leg Has been taking an aspirin 325 mg a day Was taking 2 a day but now taking one a day Right calf no longer hurts Now right inner thigh hurts Doesn't hurt at rest, hurts more with movement Denies fevers, chills, SOB Has had slight nausea since last night Has been taking aleve for pain Takes 2 tabs 4 times a day Does help ease the pain Pain feels sharp, does have burning pain When she steps on the leg pain is extreme Has used gabapentin in the past: did not tolerate this medication Is not sure what happened Would be willing to try again   HTN HCTZ 25 Valsartan 80  metoprolol 25 BP goal< 130/80  BP Readings from Last 3 Encounters:  08/18/20 136/74  08/02/20 (!) 144/78  07/26/20 (!) 142/100   HLD Atorvastatin 40mg  Lab Results  Component Value Date   CHOL 197 08/02/2020   HDL 41 08/02/2020   LDLCALC 121 (H) 08/02/2020   TRIG 198 (H)  08/02/2020   CHOLHDL 4.8 (H) 08/02/2020    Depression Had recently started Celexa Is doing well on this medication   Referral to GI for colonoscopy last Ov  Health Maintenance  Topic Date Due  . COVID-19 Vaccine (1) Never done  . PAP SMEAR-Modifier  Never done  . COLONOSCOPY (Pts 45-16yrs Insurance coverage will need to be confirmed)  Never done  . MAMMOGRAM  Never done  . TETANUS/TDAP  10/19/2025  . INFLUENZA VACCINE  Completed  . Hepatitis C Screening  Completed  . HIV Screening  Completed  . HPV VACCINES  Aged Out     Depression screen [facility] 2/9 08/18/2020 08/02/2020 07/26/2020  Decreased Interest 0 0 0  Down, Depressed, Hopeless 0 0 0  PHQ - 2 Score 0 0 0    Fall Risk  08/18/2020 08/02/2020 07/26/2020 10/20/2015  Falls in the past year? 0 0 0 No  Number falls in past yr: 0 0 0 -  Injury with Fall? 0 0 0 -  Follow up Falls evaluation completed Falls evaluation completed Falls evaluation completed -     Allergies  Allergen Reactions  . Penicillins Shortness Of Breath, Swelling and Other (See Comments)    Tongue swelling   . Shellfish-Derived Products Shortness Of Breath and Swelling    tongue swelling   . Morphine And Related Itching and Other (See Comments)    hallucinations  Prior to Admission medications   Medication Sig Start Date End Date Taking? Authorizing Provider  atorvastatin (LIPITOR) 20 MG tablet Take 20 mg by mouth daily. 06/22/20  Yes [provider]  metoprolol succinate (TOPROL-XL) 25 MG 24 hr tablet Take 25 mg by mouth daily. 06/22/20  Yes [provider]  mupirocin ointment (BACTROBAN) 2 % Apply daily and cover the wound for the next five days. Patient not taking: Reported on 07/26/2020 10/20/15   Hillis Range    Past Medical History:  Diagnosis Date  . DVT (deep venous thrombosis) ([place])   . Hypercholesteremia   . Hypertension   . Renal disorder    kidney stones    Past Surgical History:  Procedure Laterality Date  .  ABDOMINAL HYSTERECTOMY    . BREAST LUMPECTOMY    . BUNIONECTOMY      Social History   Tobacco Use  . Smoking status: Former Smoker    Packs/day: 1.00    Years: 42.00    Pack years: 42.00    Types: Cigarettes    Quit date: 07/27/2015    Years since quitting: 5.0  . Smokeless tobacco: Never Used  Substance Use Topics  . Alcohol use: Yes    Alcohol/week: 0.0 standard drinks    Comment: occasional    Family History  Problem Relation Age of Onset  . Lupus Mother   . Parkinson's disease Mother   . Cancer Father   . Cancer Sister   . Cancer Brother     Review of Systems  Constitutional: Negative for chills, fever and malaise/fatigue.  Eyes: Negative for blurred vision and double vision.  Respiratory: Negative for cough, shortness of breath and wheezing.   Cardiovascular: Negative for chest pain, palpitations, orthopnea and leg swelling.  Gastrointestinal: Positive for nausea. Negative for abdominal pain, blood in stool, constipation, diarrhea, heartburn and vomiting.  Genitourinary: Negative for dysuria, frequency and hematuria.  Musculoskeletal: Negative for back pain and joint pain.       Right upper leg pain  Skin: Negative for rash.  Neurological: Negative for dizziness, weakness and headaches.  Psychiatric/Behavioral: Negative for depression.     OBJECTIVE:  Today's Vitals   08/18/20 0834  BP: 136/74  Pulse: 70  Temp: 97.9 F (36.6 C)  SpO2: 97%  Weight: 196 lb (88.9 kg)  Height: 5\' 6"  (1.676 m)   Body mass index is 31.64 kg/m.   Physical Exam Constitutional:      General: She is not in acute distress.    Appearance: Normal appearance. She is not ill-appearing.  HENT:     Head: Normocephalic.  Cardiovascular:     Rate and Rhythm: Normal rate and regular rhythm.     Pulses:          Dorsalis pedis pulses are 3+ on the right side and 3+ on the left side.       Posterior tibial pulses are 2+ on the right side and 2+ on the left side.     Heart sounds:  Normal heart sounds. No murmur heard. No friction rub. No gallop.   Pulmonary:     Effort: Pulmonary effort is normal. No respiratory distress.     Breath sounds: Normal breath sounds. No stridor. No wheezing, rhonchi or rales.  Abdominal:     General: Bowel sounds are normal.     Palpations: Abdomen is soft.     Tenderness: There is no abdominal tenderness.  Musculoskeletal:     Right upper leg: Tenderness (  palpation, right inner thigh, no pain at rest only with movement) present. No swelling or edema.     Right lower leg: No edema.     Left lower leg: No edema.  Skin:    General: Skin is warm and dry.     Findings: No erythema.  Neurological:     Mental Status: She is alert and oriented to person, place, and time.  Psychiatric:        Mood and Affect: Mood normal. Affect is tearful.        Behavior: Behavior normal.     No results found for this or any previous visit (from the past 24 hour(s)).  No results found.   ASSESSMENT and PLAN  Problem List Items Addressed This Visit   None   Visit Diagnoses    Right leg pain    -  Primary   Relevant Medications   gabapentin (NEURONTIN) 100 MG capsule   Other Relevant Orders   US Venous Img Lower Unilateral Right      Plan . Given no pain at rest, no SOB, no fever, errythema, I do not feel this is an acute issue, due to past and family history of blood clot she is extremely concerned, I placed an order for [place] but encouraged her to go to the ED for further and immediate management given the extreme anxiety she is having with this . Continue to monitor right leg, continue compression socks . Ultrasound ordered . Gabapentin TID for pain . RTC/ED precautions provided  Return if symptoms worsen or fail to improve, for Next scheduled visit.    Huston Foley Denessa Cavan, FNP-BC Primary [facility] at [facility] [place], [place] [address] Ph.  [phone] Fax [phone]

## 2020-08-19 ENCOUNTER — Telehealth: Payer: Self-pay | Admitting: Emergency Medicine

## 2020-08-19 ENCOUNTER — Other Ambulatory Visit: Payer: Self-pay | Admitting: Emergency Medicine

## 2020-08-19 ENCOUNTER — Ambulatory Visit
Admission: RE | Admit: 2020-08-19 | Discharge: 2020-08-19 | Disposition: A | Discharge status: Home / Self Care | Payer: BC Managed Care – PPO | Source: Ambulatory Visit | Attending: Family Medicine | Admitting: Family Medicine

## 2020-08-19 DIAGNOSIS — Z86718 Personal history of other venous thrombosis and embolism: Secondary | ICD-10-CM | POA: Insufficient documentation

## 2020-08-19 DIAGNOSIS — I82411 Acute embolism and thrombosis of right femoral vein: Secondary | ICD-10-CM

## 2020-08-19 DIAGNOSIS — I82409 Acute embolism and thrombosis of unspecified deep veins of unspecified lower extremity: Secondary | ICD-10-CM | POA: Insufficient documentation

## 2020-08-19 DIAGNOSIS — I82811 Embolism and thrombosis of superficial veins of right lower extremities: Secondary | ICD-10-CM | POA: Diagnosis not present

## 2020-08-19 DIAGNOSIS — M79604 Pain in right leg: Secondary | ICD-10-CM

## 2020-08-19 DIAGNOSIS — I82890 Acute embolism and thrombosis of other specified veins: Secondary | ICD-10-CM | POA: Diagnosis not present

## 2020-08-19 MED ORDER — RIVAROXABAN 15 MG PO TABS: 15.0000 mg | ORAL_TABLET | Freq: Two times a day (BID) | ORAL | 0 refills | Status: DC

## 2020-08-19 NOTE — Telephone Encounter (Signed)
Received a call from call center regarding results of right lower extremity ultrasound done today. It shows proximal DVT. Spoke to patient about results. Normal renal function. No contraindications for Xarelto. Started on Xarelto 15 mg twice a day for 21 days and then 20 mg daily. Advised to stop aspirin. Has vascular surgery appointment on 08/31/2018. ED precautions given.

## 2020-08-24 ENCOUNTER — Encounter: Payer: Self-pay | Admitting: Family Medicine

## 2020-08-24 ENCOUNTER — Other Ambulatory Visit: Payer: Self-pay | Admitting: Family Medicine

## 2020-08-24 DIAGNOSIS — I82401 Acute embolism and thrombosis of unspecified deep veins of right lower extremity: Secondary | ICD-10-CM

## 2020-08-24 NOTE — Progress Notes (Signed)
Called patient 1714  Venous imaging right leg 08/19/20  Evidence of thrombus involving the great saphenous vein at the saphofemoral junction, extending slightly into the common femoral vein. Thrombus extends into the proximal calf. No compressibility in these regions.  No evidence of filling defect or abnormal compressibility of the profunda femoral vein, femoral vein, popliteal veins, posterior tibial vein, and peroneal vein.  Limited views of the contralateral common femoral vein are unremarkable.  IMPRESSION: Thrombus involving the great saphenous vein at the saphofemoral junction, extending slightly into the common femoral vein. Thrombus extends distally into the proximal calf.  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

## 2020-08-25 ENCOUNTER — Telehealth: Payer: Self-pay | Admitting: Hematology and Oncology

## 2020-08-25 NOTE — Telephone Encounter (Signed)
Received a new hem referral from [facility] for Acute thromboembolism of deep veins of right lower extremity. Pt has been cld and schedule to see Dr. Lindi Adie on 4/4 at 3pm. Pt needed a late appt. Aware to arrive 20 minutes early.

## 2020-08-26 ENCOUNTER — Other Ambulatory Visit: Payer: Self-pay

## 2020-08-26 DIAGNOSIS — I739 Peripheral vascular disease, unspecified: Secondary | ICD-10-CM

## 2020-08-30 ENCOUNTER — Encounter: Payer: Self-pay | Admitting: Vascular Surgery

## 2020-08-30 ENCOUNTER — Ambulatory Visit (INDEPENDENT_AMBULATORY_CARE_PROVIDER_SITE_OTHER): Payer: BC Managed Care – PPO | Admitting: Vascular Surgery

## 2020-08-30 ENCOUNTER — Other Ambulatory Visit: Payer: Self-pay

## 2020-08-30 ENCOUNTER — Encounter (HOSPITAL_COMMUNITY): Payer: Self-pay

## 2020-08-30 ENCOUNTER — Ambulatory Visit (HOSPITAL_COMMUNITY)
Admission: RE | Admit: 2020-08-30 | Discharge: 2020-08-30 | Disposition: A | Discharge status: Home / Self Care | Payer: BC Managed Care – PPO | Source: Ambulatory Visit | Attending: Vascular Surgery | Admitting: Vascular Surgery

## 2020-08-30 VITALS — BP 192/94 | HR 74 | Temp 97.9°F | Resp 16 | Ht 66.0 in | Wt 200.5 lb

## 2020-08-30 DIAGNOSIS — I82401 Acute embolism and thrombosis of unspecified deep veins of right lower extremity: Secondary | ICD-10-CM | POA: Diagnosis not present

## 2020-08-30 DIAGNOSIS — I872 Venous insufficiency (chronic) (peripheral): Secondary | ICD-10-CM

## 2020-08-30 DIAGNOSIS — I739 Peripheral vascular disease, unspecified: Secondary | ICD-10-CM

## 2020-08-30 NOTE — Progress Notes (Signed)
[facility] [place]  ASSESSMENT / PLAN: 65 y.o. female with right superficial and deep vein thrombosis, chronic venous insufficiency. On appropriate treatment for DVT currently (Xarelto). Given her history of DVT, she should consider indefinite anticoagulation to minimize her risk of recurrence. It is safe for her to return to work. She should continue compression, elevation, exercise. Follow up with me as needed.   CHIEF COMPLAINT: right lower extremity superficial / deep venous thrombosis  HISTORY OF PRESENT ILLNESS: [name] is a 65 y.o. female referred to the clinic for evaluation of right lower extremity thrombus.  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  She recently developed right lower extremity pain and redness.  The redness was about the medial thigh in the distribution of the great saphenous vein.  Duplex ultrasound revealed a superficial venous thrombosis of the greater saphenous vein.  This was observed with serial ultrasound, and unfortunately she had progression of thrombus into the saphenofemoral junction.  She was started, appropriately, on Xarelto.  She is seen symptomatic improvement since.  She had a right lower extremity DVT previously in 2018.  She has a strong family history of clotting.  Her mother died of a "blood clot."  Her sister has had TIA attributed to clotting.    Past Medical History:  Diagnosis Date  . DVT (deep venous thrombosis) ([place])   . Hypercholesteremia   . Hypertension   . Renal disorder    kidney stones   Past Surgical History:  Procedure Laterality Date  . ABDOMINAL HYSTERECTOMY    . BREAST LUMPECTOMY    . BUNIONECTOMY     Family History  Problem Relation Age of Onset  . Lupus Mother   . Parkinson's disease Mother   . Cancer Father   . Cancer Sister   . Cancer Brother     Social History   Socioeconomic History  . Marital status: Widowed    Spouse name: Not  on file  . Number of children: Not on file  . Years of education: Not on file  . Highest education level: Not on file  Occupational History  . Not on file  Tobacco Use  . Smoking status: Former Smoker    Packs/day: 1.00    Years: 42.00    Pack years: 42.00    Types: Cigarettes    Quit date: 07/27/2015    Years since quitting: 5.0  . Smokeless tobacco: Never Used  Substance and Sexual Activity  . Alcohol use: Yes    Alcohol/week: 0.0 standard drinks    Comment: occasional  . Drug use: No  . Sexual activity: Yes    Birth control/protection: Condom  Other Topics Concern  . Not on file  Social History Narrative  . Not on file   Social Determinants of Health   Financial Resource Strain: Not on file  Food Insecurity: Not on file  Transportation Needs: Not on file  Physical Activity: Not on file  Stress: Not on file  Social Connections: Not on file  Intimate Partner Violence: Not on file    Allergies  Allergen Reactions  . Penicillins Shortness Of Breath, Swelling and Other (See Comments)    Tongue swelling   . Shellfish-Derived Products Shortness Of Breath and Swelling    tongue swelling   . Morphine And Related Itching and Other (See Comments)    hallucinations     Current Outpatient Medications  Medication Sig Dispense  Refill  . atorvastatin (LIPITOR) 40 MG tablet Take 1 tablet (40 mg total) by mouth daily. 90 tablet 3  . citalopram (CELEXA) 20 MG tablet Take 1 tablet (20 mg total) by mouth daily. 30 tablet 3  . gabapentin (NEURONTIN) 100 MG capsule Take 1 capsule (100 mg total) by mouth 3 (three) times daily. 90 capsule 3  . hydrochlorothiazide (HYDRODIURIL) 25 MG tablet Take 1 tablet (25 mg total) by mouth daily. 90 tablet 3  . metoprolol succinate (TOPROL-XL) 25 MG 24 hr tablet Take 25 mg by mouth daily.    . Rivaroxaban (XARELTO) 15 MG TABS tablet Take 1 tablet (15 mg total) by mouth 2 (two) times daily with a meal for 21 days. Stop aspirin. 42 tablet 0  .  valsartan (DIOVAN) 80 MG tablet Take 1 tablet (80 mg total) by mouth daily. 90 tablet 3   No current facility-administered medications for this visit.    REVIEW OF SYSTEMS:  [X]  denotes positive finding, [ ]  denotes negative finding Cardiac  Comments:  Chest pain or chest pressure:    Shortness of breath upon exertion:    Short of breath when lying flat:    Irregular heart rhythm:        Vascular    Pain in calf, thigh, or hip brought on by ambulation: x   Pain in feet at night that wakes you up from your sleep:  x   Blood clot in your veins: x   Leg swelling:         Pulmonary    Oxygen at home:    Productive cough:     Wheezing:         Neurologic    Sudden weakness in arms or legs:     Sudden numbness in arms or legs:     Sudden onset of difficulty speaking or slurred speech:    Temporary loss of vision in one eye:     Problems with dizziness:         Gastrointestinal    Blood in stool:     Vomited blood:         Genitourinary    Burning when urinating:     Blood in urine:        Psychiatric    Major depression:         Hematologic    Bleeding problems:    Problems with blood clotting too easily:        Skin    Rashes or ulcers:        Constitutional    Fever or chills:      PHYSICAL EXAM  Vitals:   08/30/20 0941  BP: (!) 192/94  Pulse: 74  Resp: 16  Temp: 97.9 F (36.6 C)  TempSrc: Temporal  SpO2: 98%  Weight: 200 lb 8 oz (90.9 kg)  Height: 5\' 6"  (1.676 m)    Constitutional: well appearing. No distress. Appears well nourished.  Neurologic: CN intact. No focal findings. No sensory loss. Psychiatric: Mood and affect symmetric and appropriate. Eyes: No icterus. No conjunctival pallor. Ears, nose, throat: mucous membranes moist. Midline trachea.  Cardiac: regular rate and rhythm.  Respiratory: unlabored. Abdominal: soft, non-tender, non-distended.  Peripheral vascular:  2+ DP bilaterally  Chronic venous dermatitis right ankle Extremity:  No edema. No cyanosis. No pallor.  Skin: No gangrene. No ulceration.  Lymphatic: No Stemmer's sign. No palpable lymphadenopathy.  PERTINENT LABORATORY AND RADIOLOGIC DATA  Most recent CBC CBC Latest Ref Rng & Units  07/26/2020 07/22/2016 12/13/2013  WBC 3.4 - 10.8 x10E3/uL 5.6 10.5 -  Hemoglobin 11.1 - 15.9 g/dL 15.0 14.6 14.6  Hematocrit 34.0 - 46.6 % 43.9 43.3 43.0  Platelets 150 - 450 x10E3/uL 246 219 -     Most recent CMP CMP Latest Ref Rng & Units 07/26/2020 07/22/2016 12/13/2013  Glucose 65 - 99 mg/dL 108(H) 121(H) 94  BUN 8 - 27 mg/dL 15 17 13   Creatinine 0.57 - 1.00 mg/dL 0.74 0.66 1.00  Sodium 134 - 144 mmol/L 140 140 142  Potassium 3.5 - 5.2 mmol/L 4.1 3.6 3.5(L)  Chloride 96 - 106 mmol/L 102 108 102  CO2 20 - 29 mmol/L 19(L) 24 -  Calcium 8.7 - 10.3 mg/dL 9.6 9.6 -  Total Protein 6.0 - 8.5 g/dL 7.1 - -  Total Bilirubin 0.0 - 1.2 mg/dL 1.1 - -  Alkaline Phos 44 - 121 IU/L 117 - -  AST 0 - 40 IU/L 18 - -  ALT 0 - 32 IU/L 25 - -    Renal function CrCl cannot be calculated (Patient's most recent lab result is older than the maximum 21 days allowed.).  Hgb A1c MFr Bld (%)  Date Value  07/26/2020 6.0 (H)    LDL Chol Calc (NIH)  Date Value Ref Range Status  08/02/2020 121 (H) 0 - 99 mg/dL Final     Vascular Imaging: CLINICAL DATA:  Upper right leg pain.  EXAM: RIGHT LOWER EXTREMITY VENOUS DOPPLER ULTRASOUND  TECHNIQUE: Gray-scale sonography with compression, as well as color and duplex ultrasound, were performed to evaluate the deep venous system(s) from the level of the common femoral vein through the popliteal and proximal calf veins.  COMPARISON:  None.  FINDINGS: VENOUS  Evidence of thrombus involving the great saphenous vein at the saphofemoral junction, extending slightly into the common femoral vein. Thrombus extends into the proximal calf. No compressibility in these regions.  No evidence of filling defect or abnormal compressibility of  the profunda femoral vein, femoral vein, popliteal veins, posterior tibial vein, and peroneal vein.  Limited views of the contralateral common femoral vein are unremarkable.  IMPRESSION: Thrombus involving the great saphenous vein at the saphofemoral junction, extending slightly into the common femoral vein. Thrombus extends distally into the proximal calf.  These results will be called to the ordering clinician or representative by the Radiologist Assistant, and communication documented in the PACS or [employer].   Electronically Signed   By: Margaretha Sheffield MD   On: 08/19/2020 17:05  Yevonne Aline. Stanford Breed, MD [facility] Phone Number: [phone] 08/30/2020 9:57 AM

## 2020-08-31 ENCOUNTER — Encounter: Payer: Self-pay | Admitting: Family Medicine

## 2020-08-31 ENCOUNTER — Ambulatory Visit (INDEPENDENT_AMBULATORY_CARE_PROVIDER_SITE_OTHER): Payer: BC Managed Care – PPO | Admitting: Family Medicine

## 2020-08-31 VITALS — BP 145/79 | HR 67 | Temp 98.1°F | Ht 66.0 in | Wt 200.0 lb

## 2020-08-31 DIAGNOSIS — I10 Essential (primary) hypertension: Secondary | ICD-10-CM

## 2020-08-31 DIAGNOSIS — I82401 Acute embolism and thrombosis of unspecified deep veins of right lower extremity: Secondary | ICD-10-CM

## 2020-08-31 DIAGNOSIS — F3289 Other specified depressive episodes: Secondary | ICD-10-CM

## 2020-08-31 NOTE — Progress Notes (Signed)
3/16/20224:06 PM  Bernette Redbird 02-Dec-1955, 65 y.o., female [id number]  Chief Complaint  Patient presents with  . leg pain follow up     Patient states is feeling better with treatment    HPI:   Patient is a 65 y.o. female with past medical history significant for prev DVT and depression who presents today for routine followup.  No acute issues today  Saw Vascular 5/15 Agreed with Xarelto Will be returning to work Still having some burning of upper leg Pain is improved Has been wearing compression socks  HTN HCTZ 25 Valsartan 80  metoprolol 25 BP goal< 130/80 Had accidentally skipped 2 days of medication  BP Readings from Last 3 Encounters:  08/31/20 (!) 145/79  08/30/20 (!) 192/94  08/18/20 136/74   HLD Atorvastatin 40mg  Lab Results  Component Value Date   CHOL 197 08/02/2020   HDL 41 08/02/2020   LDLCALC 121 (H) 08/02/2020   TRIG 198 (H) 08/02/2020   CHOLHDL 4.8 (H) 08/02/2020    Depression On Celexa doing well on this Denies any issues   Referral to GI for colonoscopy last Ov, needs to schedule Mammogram appointment on the 31st Needs to schedule pap  Health Maintenance  Topic Date Due  . COVID-19 Vaccine (1) Never done  . PAP SMEAR-Modifier  Never done  . COLONOSCOPY (Pts 45-20yrs Insurance coverage will need to be confirmed)  Never done  . MAMMOGRAM  Never done  . TETANUS/TDAP  10/19/2025  . INFLUENZA VACCINE  Completed  . Hepatitis C Screening  Completed  . HIV Screening  Completed  . HPV VACCINES  Aged Out     Depression screen [facility] 2/9 08/31/2020 08/18/2020 08/02/2020  Decreased Interest 0 0 0  Down, Depressed, Hopeless 0 0 0  PHQ - 2 Score 0 0 0    Fall Risk  08/31/2020 08/18/2020 08/02/2020 07/26/2020 10/20/2015  Falls in the past year? 0 0 0 0 No  Number falls in past yr: 0 0 0 0 -  Injury with Fall? 0 0 0 0 -  Follow up Falls evaluation completed Falls evaluation completed Falls evaluation completed Falls evaluation completed -      Allergies  Allergen Reactions  . Penicillins Shortness Of Breath, Swelling and Other (See Comments)    Tongue swelling   . Shellfish-Derived Products Shortness Of Breath and Swelling    tongue swelling   . Morphine And Related Itching and Other (See Comments)    hallucinations     Prior to Admission medications   Medication Sig Start Date End Date Taking? Authorizing Provider  atorvastatin (LIPITOR) 20 MG tablet Take 20 mg by mouth daily. 06/22/20  Yes [provider]  metoprolol succinate (TOPROL-XL) 25 MG 24 hr tablet Take 25 mg by mouth daily. 06/22/20  Yes [provider]  mupirocin ointment (BACTROBAN) 2 % Apply daily and cover the wound for the next five days. Patient not taking: Reported on 07/26/2020 10/20/15   [place] [place]    Past Medical History:  Diagnosis Date  . DVT (deep venous thrombosis) ([place])   . Hypercholesteremia   . Hypertension   . Renal disorder    kidney stones    Past Surgical History:  Procedure Laterality Date  . ABDOMINAL HYSTERECTOMY    . BREAST LUMPECTOMY    . BUNIONECTOMY      Social History   Tobacco Use  . Smoking status: Former Smoker    Packs/day: 1.00    Years: 42.00  Pack years: 42.00    Types: Cigarettes    Quit date: 07/27/2015    Years since quitting: 5.1  . Smokeless tobacco: Never Used  Substance Use Topics  . Alcohol use: Yes    Alcohol/week: 0.0 standard drinks    Comment: occasional    Family History  Problem Relation Age of Onset  . Lupus Mother   . Parkinson's disease Mother   . Cancer Father   . Cancer Sister   . Cancer Brother     Review of Systems  Constitutional: Negative for chills, fever and malaise/fatigue.  Eyes: Negative for blurred vision and double vision.  Respiratory: Negative for cough, shortness of breath and wheezing.   Cardiovascular: Negative for chest pain, palpitations, orthopnea and leg swelling.  Gastrointestinal: Negative for abdominal pain, blood in  stool, constipation, diarrhea, heartburn, nausea and vomiting.  Genitourinary: Negative for dysuria, frequency and hematuria.  Musculoskeletal: Negative for back pain and joint pain.       Right upper leg pain  Skin: Negative for rash.  Neurological: Negative for dizziness, weakness and headaches.  Psychiatric/Behavioral: Negative for depression.     OBJECTIVE:  Today's Vitals   08/31/20 1545  BP: (!) 145/79  Pulse: 67  Temp: 98.1 F (36.7 C)  SpO2: 99%  Weight: 200 lb (90.7 kg)  Height: 5\' 6"  (1.676 m)   Body mass index is 32.28 kg/m.   Physical Exam Constitutional:      General: She is not in acute distress.    Appearance: Normal appearance. She is not ill-appearing.  HENT:     Head: Normocephalic.  Cardiovascular:     Rate and Rhythm: Normal rate and regular rhythm.     Pulses:          Dorsalis pedis pulses are 3+ on the right side and 3+ on the left side.       Posterior tibial pulses are 2+ on the right side and 2+ on the left side.     Heart sounds: Normal heart sounds. No murmur heard. No friction rub. No gallop.   Pulmonary:     Effort: Pulmonary effort is normal. No respiratory distress.     Breath sounds: Normal breath sounds. No stridor. No wheezing, rhonchi or rales.  Abdominal:     General: Bowel sounds are normal.     Palpations: Abdomen is soft.     Tenderness: There is no abdominal tenderness.  Musculoskeletal:     Right upper leg: Tenderness (palpation) present. No swelling or edema.     Right lower leg: No edema.     Left lower leg: No edema.  Skin:    General: Skin is warm and dry.     Findings: No erythema.  Neurological:     Mental Status: She is alert and oriented to person, place, and time.  Psychiatric:        Mood and Affect: Mood normal.        Behavior: Behavior normal.     No results found for this or any previous visit (from the past 24 hour(s)).  No results found.   ASSESSMENT and PLAN  Problem List Items Addressed  This Visit      Cardiovascular and Mediastinum   DVT (deep venous thrombosis) (HCC) - Primary     Other   Depression    Other Visit Diagnoses    Essential hypertension          Plan  . RTC/ED precautions provided . No medication changes needed  at this time . Discussed Medication compliance, Continue medications for BP goal< 130/80 . Declined colon cancer screen and pap at this time, r/se/b discussed   Return in about 3 months (around 12/01/2020).   Huston Foley Elvyn Krohn, FNP-BC [facility] [place],  [id number] Ph.  [phone] Fax [phone]

## 2020-08-31 NOTE — Patient Instructions (Addendum)
  Hypertension, Adult Hypertension is another name for high blood pressure. High blood pressure forces your heart to work harder to pump blood. This can cause problems over time. There are two numbers in a blood pressure reading. There is a top number (systolic) over a bottom number (diastolic). It is best to have a blood pressure that is below 120/80. Healthy choices can help lower your blood pressure, or you may need medicine to help lower it. What are the causes? The cause of this condition is not known. Some conditions may be related to high blood pressure. What increases the risk?  Smoking.  Having type 2 diabetes mellitus, high cholesterol, or both.  Not getting enough exercise or physical activity.  Being overweight.  Having too much fat, sugar, calories, or salt (sodium) in your diet.  Drinking too much alcohol.  Having long-term (chronic) kidney disease.  Having a family history of high blood pressure.  Age. Risk increases with age.  Race. You may be at higher risk if you are African American.  Gender. Men are at higher risk than women before age 45. After age 65, women are at higher risk than men.  Having obstructive sleep apnea.  Stress. What are the signs or symptoms?  High blood pressure may not cause symptoms. Very high blood pressure (hypertensive crisis) may cause: ? Headache. ? Feelings of worry or nervousness (anxiety). ? Shortness of breath. ? Nosebleed. ? A feeling of being sick to your stomach (nausea). ? Throwing up (vomiting). ? Changes in how you see. ? Very bad chest pain. ? Seizures. How is this treated?  This condition is treated by making healthy lifestyle changes, such as: ? Eating healthy foods. ? Exercising more. ? Drinking less alcohol.  Your health care provider may prescribe medicine if lifestyle changes are not enough to get your blood pressure under control, and if: ? Your top number is above 130. ? Your bottom number is  above 80.  Your personal target blood pressure may vary. Follow these instructions at home: Eating and drinking  If told, follow the DASH eating plan. To follow this plan: ? Fill one half of your plate at each meal with fruits and vegetables. ? Fill one fourth of your plate at each meal with whole grains. Whole grains include whole-wheat pasta, brown rice, and whole-grain bread. ? Eat or drink low-fat dairy products, such as skim milk or low-fat yogurt. ? Fill one fourth of your plate at each meal with low-fat (lean) proteins. Low-fat proteins include fish, chicken without skin, eggs, beans, and tofu. ? Avoid fatty meat, cured and processed meat, or chicken with skin. ? Avoid pre-made or processed food.  Eat less than 1,500 mg of salt each day.  Do not drink alcohol if: ? Your doctor tells you not to drink. ? You are pregnant, may be pregnant, or are planning to become pregnant.  If you drink alcohol: ? Limit how much you use to:  0-1 drink a day for women.  0-2 drinks a day for men. ? Be aware of how much alcohol is in your drink. In the [place] one drink equals one 12 oz bottle of beer (355 mL), one 5 oz glass of wine (148 mL), or one 1 oz glass of hard liquor (44 mL).   Lifestyle  Work with your doctor to stay at a healthy weight or to lose weight. Ask your doctor what the best weight is for you.  Get at least 30 minutes of   exercise most days of the week. This may include walking, swimming, or biking.  Get at least 30 minutes of exercise that strengthens your muscles (resistance exercise) at least 3 days a week. This may include lifting weights or doing Pilates.  Do not use any products that contain nicotine or tobacco, such as cigarettes, e-cigarettes, and chewing tobacco. If you need help quitting, ask your doctor.  Check your blood pressure at home as told by your doctor.  Keep all follow-up visits as told by your doctor. This is important.   Medicines  Take  over-the-counter and prescription medicines only as told by your doctor. Follow directions carefully.  Do not skip doses of blood pressure medicine. The medicine does not work as well if you skip doses. Skipping doses also puts you at risk for problems.  Ask your doctor about side effects or reactions to medicines that you should watch for. Contact a doctor if you:  Think you are having a reaction to the medicine you are taking.  Have headaches that keep coming back (recurring).  Feel dizzy.  Have swelling in your ankles.  Have trouble with your vision. Get help right away if you:  Get a very bad headache.  Start to feel mixed up (confused).  Feel weak or numb.  Feel faint.  Have very bad pain in your: ? Chest. ? Belly (abdomen).  Throw up more than once.  Have trouble breathing. Summary  Hypertension is another name for high blood pressure.  High blood pressure forces your heart to work harder to pump blood.  For most people, a normal blood pressure is less than 120/80.  Making healthy choices can help lower blood pressure. If your blood pressure does not get lower with healthy choices, you may need to take medicine. This information is not intended to replace advice given to you by your health care provider. Make sure you discuss any questions you have with your health care provider. Document Revised: 02/12/2018 Document Reviewed: 02/12/2018 Elsevier Patient Education  2021 Elsevier Inc.   If you have lab work done today you will be contacted with your lab results within the next 2 weeks.  If you have not heard from us then please contact us. The fastest way to get your results is to register for My Chart.   IF you received an x-ray today, you will receive an invoice from [facility]. Please contact [facility] at [phone] with questions or concerns regarding your invoice.   IF you received labwork today, you will receive an invoice from  [facility]. Please contact [facility] at [phone] with questions or concerns regarding your invoice.   Our billing staff will not be able to assist you with questions regarding bills from these companies.  You will be contacted with the lab results as soon as they are available. The fastest way to get your results is to activate your My Chart account. Instructions are located on the last page of this paperwork. If you have not heard from us regarding the results in 2 weeks, please contact this office.      

## 2020-09-12 ENCOUNTER — Other Ambulatory Visit: Payer: Self-pay | Admitting: Emergency Medicine

## 2020-09-12 DIAGNOSIS — I82411 Acute embolism and thrombosis of right femoral vein: Secondary | ICD-10-CM

## 2020-09-13 ENCOUNTER — Other Ambulatory Visit: Payer: Self-pay | Admitting: *Deleted

## 2020-09-13 DIAGNOSIS — I82411 Acute embolism and thrombosis of right femoral vein: Secondary | ICD-10-CM

## 2020-09-13 MED ORDER — RIVAROXABAN 20 MG PO TABS: 20.0000 mg | ORAL_TABLET | Freq: Every day | ORAL | 0 refills | Status: DC

## 2020-09-15 DIAGNOSIS — Z803 Family history of malignant neoplasm of breast: Secondary | ICD-10-CM | POA: Diagnosis not present

## 2020-09-15 DIAGNOSIS — Z1231 Encounter for screening mammogram for malignant neoplasm of breast: Secondary | ICD-10-CM | POA: Diagnosis not present

## 2020-09-18 NOTE — Progress Notes (Signed)
[facility] CONSULT NOTE  Patient Care Team: Just, Laurita Quint, FNP as PCP - General (Family Medicine)  CHIEF COMPLAINTS/PURPOSE OF CONSULTATION:  Newly diagnosed DVT  HISTORY OF PRESENTING ILLNESS:  [name] 65 y.o. female is here because of recent diagnosis of acute DVT of the right lower extremity. She is referred by [facility]. She presented to her PCP for right leg pain and had a history of DVT. [place] on 08/04/20 showed acute superficial vein thrombosis involving the right great saphenous vein. [place] on 08/19/20 showed a thrombus involving the great saphenous vein at the saphofemoral junction, extending slightly into the common femoral vein. She was started on Xarelto on 08/19/20. She presents to the clinic today for initial evaluation.   I reviewed her records extensively and collaborated the history with the patient.  MEDICAL HISTORY:  Past Medical History:  Diagnosis Date  . DVT (deep venous thrombosis) ([place])   . Hypercholesteremia   . Hypertension   . Renal disorder    kidney stones    SURGICAL HISTORY: Past Surgical History:  Procedure Laterality Date  . ABDOMINAL HYSTERECTOMY    . BREAST LUMPECTOMY    . BUNIONECTOMY      SOCIAL HISTORY: Social History   Socioeconomic History  . Marital status: Widowed    Spouse name: Not on file  . Number of children: Not on file  . Years of education: Not on file  . Highest education level: Not on file  Occupational History  . Not on file  Tobacco Use  . Smoking status: Former Smoker    Packs/day: 1.00    Years: 42.00    Pack years: 42.00    Types: Cigarettes    Quit date: 07/27/2015    Years since quitting: 5.1  . Smokeless tobacco: Never Used  Substance and Sexual Activity  . Alcohol use: Yes    Alcohol/week: 0.0 standard drinks    Comment: occasional  . Drug use: No  . Sexual activity: Yes    Birth control/protection: Condom  Other Topics Concern  . Not on file  Social History Narrative  .  Not on file   Social Determinants of Health   Financial Resource Strain: Not on file  Food Insecurity: Not on file  Transportation Needs: Not on file  Physical Activity: Not on file  Stress: Not on file  Social Connections: Not on file  Intimate Partner Violence: Not on file    FAMILY HISTORY: Family History  Problem Relation Age of Onset  . Lupus Mother   . Parkinson's disease Mother   . Cancer Father   . Cancer Sister   . Cancer Brother     ALLERGIES:  is allergic to penicillins, shellfish-derived products, and morphine and related.  MEDICATIONS:  Current Outpatient Medications  Medication Sig Dispense Refill  . atorvastatin (LIPITOR) 40 MG tablet Take 1 tablet (40 mg total) by mouth daily. 90 tablet 3  . citalopram (CELEXA) 20 MG tablet Take 1 tablet (20 mg total) by mouth daily. 30 tablet 3  . gabapentin (NEURONTIN) 100 MG capsule Take 1 capsule (100 mg total) by mouth 3 (three) times daily. 90 capsule 3  . hydrochlorothiazide (HYDRODIURIL) 25 MG tablet Take 1 tablet (25 mg total) by mouth daily. 90 tablet 3  . metoprolol succinate (TOPROL-XL) 25 MG 24 hr tablet Take 25 mg by mouth daily.    . Rivaroxaban (XARELTO) 20 MG TABS tablet Take 1 tablet (20 mg total) by mouth daily with supper.  30 tablet 0  . valsartan (DIOVAN) 80 MG tablet Take 1 tablet (80 mg total) by mouth daily. 90 tablet 3   No current facility-administered medications for this visit.    REVIEW OF SYSTEMS:   Constitutional: Denies fevers, chills or abnormal night sweats  All other systems were reviewed with the patient and are negative.  PHYSICAL EXAMINATION: ECOG PERFORMANCE STATUS: 1 - Symptomatic but completely ambulatory  Vitals:   09/19/20 1515  BP: (!) 152/63  Pulse: 80  Resp: 18  Temp: 97.9 F (36.6 C)  SpO2: 97%   Filed Weights   09/19/20 1515  Weight: 200 lb 6.4 oz (90.9 kg)       LABORATORY DATA:  I have reviewed the data as listed Lab Results  Component Value Date   WBC  5.6 07/26/2020   HGB 15.0 07/26/2020   HCT 43.9 07/26/2020   MCV 89 07/26/2020   PLT 246 07/26/2020   Lab Results  Component Value Date   NA 140 07/26/2020   K 4.1 07/26/2020   CL 102 07/26/2020   CO2 19 (L) 07/26/2020    RADIOGRAPHIC STUDIES: I have personally reviewed the radiological reports and agreed with the findings in the report.  ASSESSMENT AND PLAN:  DVT (deep venous thrombosis) ([place]) First DVT 2018 status post knee surgery Acute DVT of the right lower extremity. She is referred by [facility]. She presented to her PCP for right leg pain and had a history of DVT.  [place] on 08/04/20 showed acute superficial vein thrombosis involving the right great saphenous vein.  [place] on September 06, 2020 showed a thrombus involving the great saphenous vein at the saphofemoral junction, extending slightly into the common femoral vein.   Current Treatment: Xarelto started on 2020/09/06. Grandmother died of blood clots, her mother and her 5 sisters all had blood clots  Recurrent blood clots I discussed with the patient risk factors for blood clots.  Inherited risk factors include: 1. Factor V Leiden mutation 2. Prothrombin gene G20210A 3. Protein S deficiency  4. Protein C deficiency  5. Antithrombin deficiency  Acquired risk factors include: 1. Antiphospholipid antibody syndrome She does not have any other risk factors like current tobacco use.  She is a previous smoker.  She works at the [employer] and stays very active.  Workup recommended: Bloodwork to evaluate for the 5 inherited factors mentioned above along with antiphospholipid antibodies. Telephone call in 1 week to discuss the results of the tests     All questions were answered. The patient knows to call the clinic with any problems, questions or concerns.   Rulon Eisenmenger, MD, MPH 09/19/2020    I, Molly Dorshimer, am acting as scribe for Nicholas Lose, MD.  I have reviewed the above documentation for accuracy and completeness,  and I agree with the above.

## 2020-09-19 ENCOUNTER — Inpatient Hospital Stay: Payer: BC Managed Care – PPO | Attending: Hematology and Oncology | Admitting: Hematology and Oncology

## 2020-09-19 ENCOUNTER — Telehealth: Payer: Self-pay | Admitting: Hematology and Oncology

## 2020-09-19 ENCOUNTER — Inpatient Hospital Stay: Payer: BC Managed Care – PPO

## 2020-09-19 ENCOUNTER — Other Ambulatory Visit: Payer: Self-pay

## 2020-09-19 DIAGNOSIS — Z452 Encounter for adjustment and management of vascular access device: Secondary | ICD-10-CM | POA: Diagnosis not present

## 2020-09-19 DIAGNOSIS — I82401 Acute embolism and thrombosis of unspecified deep veins of right lower extremity: Secondary | ICD-10-CM

## 2020-09-19 DIAGNOSIS — Z7901 Long term (current) use of anticoagulants: Secondary | ICD-10-CM | POA: Diagnosis not present

## 2020-09-19 DIAGNOSIS — I82811 Embolism and thrombosis of superficial veins of right lower extremities: Secondary | ICD-10-CM | POA: Insufficient documentation

## 2020-09-19 NOTE — Telephone Encounter (Signed)
Scheduled appts per 4/4 los. Pt aware.

## 2020-09-19 NOTE — Assessment & Plan Note (Signed)
Acute DVT of the right lower extremity. She is referred by [facility]. She presented to her PCP for right leg pain and had a history of DVT.  [place] on 08/04/20 showed acute superficial vein thrombosis involving the right great saphenous vein.  [place] on 08/19/20 showed a thrombus involving the great saphenous vein at the saphofemoral junction, extending slightly into the common femoral vein.   Current Treatment: Xarelto on 08/19/20.  Chronic/recurrent blood clots I discussed with the patient risk factors for blood clots.  Inherited risk factors include: 1. Factor V Leiden mutation 2. Prothrombin gene G20210A 3. Protein S deficiency  4. Protein C deficiency  5. Antithrombin deficiency  Acquired risk factors include: 1. Antiphospholipid antibody syndrome 2. Tobacco use 3. Obesity 4. Medications including oral contraceptives 5. Sedentary behavior including postoperative state 6. Foreign bodies in circulation  Workup recommended: Bloodwork to evaluate for the 5 inherited factors mentioned above along with antiphospholipid antibodies. Return to clinic in one to 2 weeks to discuss the results of the tests

## 2020-09-20 LAB — LUPUS ANTICOAGULANT PANEL
DRVVT: 66.9 s — ABNORMAL HIGH (ref 0.0–47.0)
PTT Lupus Anticoagulant: 28.4 s (ref 0.0–51.9)

## 2020-09-20 LAB — DRVVT CONFIRM: dRVVT Confirm: 1.3 ratio — ABNORMAL HIGH (ref 0.8–1.2)

## 2020-09-20 LAB — PROTEIN S, TOTAL: Protein S Ag, Total: 91 % (ref 60–150)

## 2020-09-20 LAB — ANTITHROMBIN III ANTIGEN: AT III AG PPP IMM-ACNC: 97 % (ref 72–124)

## 2020-09-20 LAB — CARDIOLIPIN ANTIBODIES, IGG, IGM, IGA
Anticardiolipin IgA: <9 APL U/mL (ref 0–11)
Anticardiolipin IgG: <9 GPL U/mL (ref 0–14)
Anticardiolipin IgM: <9 MPL U/mL (ref 0–12)

## 2020-09-20 LAB — DRVVT MIX: dRVVT Mix: 57.4 s — ABNORMAL HIGH (ref 0.0–40.4)

## 2020-09-20 LAB — BETA-2-GLYCOPROTEIN I ABS, IGG/M/A
Beta-2 Glyco I IgG: <9 GPI IgG units (ref 0–20)
Beta-2-Glycoprotein I IgA: <9 GPI IgA units (ref 0–25)
Beta-2-Glycoprotein I IgM: <9 GPI IgM units (ref 0–32)

## 2020-09-21 LAB — PROTEIN C, TOTAL: Protein C, Total: 113 % (ref 60–150)

## 2020-09-25 NOTE — Progress Notes (Signed)
Patient Care Team: Just, Laurita Quint, FNP as PCP - General (Family Medicine)  DIAGNOSIS:    ICD-10-CM   1. Acute thromboembolism of deep veins of right lower extremity (HCC)  I82.401     CHIEF COMPLIANT: Follow-up of DVT   INTERVAL HISTORY: [name] is a 65 y.o. with above-mentioned history of right lower leg DVT currently on Xarelto. She presents to the clinic today for follow-up.   ALLERGIES:  is allergic to penicillins, shellfish-derived products, and morphine and related.  MEDICATIONS:  Current Outpatient Medications  Medication Sig Dispense Refill  . atorvastatin (LIPITOR) 40 MG tablet Take 1 tablet (40 mg total) by mouth daily. 90 tablet 3  . citalopram (CELEXA) 20 MG tablet Take 1 tablet (20 mg total) by mouth daily. 30 tablet 3  . gabapentin (NEURONTIN) 100 MG capsule Take 1 capsule (100 mg total) by mouth 3 (three) times daily. 90 capsule 3  . hydrochlorothiazide (HYDRODIURIL) 25 MG tablet Take 1 tablet (25 mg total) by mouth daily. 90 tablet 3  . metoprolol succinate (TOPROL-XL) 25 MG 24 hr tablet Take 25 mg by mouth daily.    . Rivaroxaban (XARELTO) 20 MG TABS tablet Take 1 tablet (20 mg total) by mouth daily with supper. 30 tablet 0  . valsartan (DIOVAN) 80 MG tablet Take 1 tablet (80 mg total) by mouth daily. 90 tablet 3   No current facility-administered medications for this visit.    PHYSICAL EXAMINATION: ECOG PERFORMANCE STATUS: 1 - Symptomatic but completely ambulatory  There were no vitals filed for this visit. There were no vitals filed for this visit.   LABORATORY DATA:  I have reviewed the data as listed CMP Latest Ref Rng & Units 07/26/2020 07/22/2016 12/13/2013  Glucose 65 - 99 mg/dL 108(H) 121(H) 94  BUN 8 - 27 mg/dL 15 17 13   Creatinine 0.57 - 1.00 mg/dL 0.74 0.66 1.00  Sodium 134 - 144 mmol/L 140 140 142  Potassium 3.5 - 5.2 mmol/L 4.1 3.6 3.5(L)  Chloride 96 - 106 mmol/L 102 108 102  CO2 20 - 29 mmol/L 19(L) 24 -  Calcium 8.7 - 10.3 mg/dL  9.6 9.6 -  Total Protein 6.0 - 8.5 g/dL 7.1 - -  Total Bilirubin 0.0 - 1.2 mg/dL 1.1 - -  Alkaline Phos 44 - 121 IU/L 117 - -  AST 0 - 40 IU/L 18 - -  ALT 0 - 32 IU/L 25 - -    Lab Results  Component Value Date   WBC 5.6 07/26/2020   HGB 15.0 07/26/2020   HCT 43.9 07/26/2020   MCV 89 07/26/2020   PLT 246 07/26/2020   NEUTROABS 4.0 07/26/2020    ASSESSMENT & PLAN:  Acute thromboembolism of deep veins of right lower extremity (HCC) First DVT 2018 status post knee surgery Acute DVT of the right lower extremity. She is referred by [facility]. She presented to her PCP for right leg pain and had a history of DVT.  [place] on 08/04/20 showed acute superficial vein thrombosis involving the right great saphenous vein.  [place] on 2020/09/15 showed a thrombus involving the great saphenous vein at the saphofemoral junction, extending slightly into the common femoral vein.   Current Treatment: Xarelto started on 2020-09-15. Grandmother died of blood clots, her mother and her 5 sisters all had blood clots    Inherited risk factors include: 1. Factor V Leiden mutation: Pending 2. Prothrombin gene G20210A: Pending 3. Protein S deficiency: Normal 4. Protein C deficiency: Normal  5. Antithrombin deficiency: Normal  Acquired risk factors include: Antiphospholipid antibody syndrome: Positive for APL AB on 09/19/20 Explained the mechanism of APL ab. Only repeat positive is considered to be truly positive Plan: recheck in 3 months Telephone visit after the blood test to discuss results.     No orders of the defined types were placed in this encounter.  The patient has a good understanding of the overall plan. she agrees with it. she will call with any problems that may develop before the next visit here.  Total time spent: 20 mins including face to face time and time spent for planning, charting and coordination of care  Rulon Eisenmenger, MD, MPH 09/26/2020  I, Molly Dorshimer, am acting as  scribe for Dr. Nicholas Lose.  I have reviewed the above documentation for accuracy and completeness, and I agree with the above.

## 2020-09-25 NOTE — Assessment & Plan Note (Signed)
First DVT 2018 status post knee surgery Acute DVT of the right lower extremity. She is referred by [facility]. She presented to her PCP for right leg pain and had a history of DVT.  [place] on 08/04/20 showed acute superficial vein thrombosis involving the right great saphenous vein.  [place] on 08/23/20 showed a thrombus involving the great saphenous vein at the saphofemoral junction, extending slightly into the common femoral vein.   Current Treatment: Xarelto started on August 23, 2020. Grandmother died of blood clots, her mother and her 5 sisters all had blood clots  Recurrent blood clots I discussed with the patient risk factors for blood clots.  Inherited risk factors include: 1. Factor V Leiden mutation 2. Prothrombin gene G20210A 3. Protein S deficiency  4. Protein C deficiency  5. Antithrombin deficiency  Acquired risk factors include: Antiphospholipid antibody syndrome: Positive for APL AB on 09/19/20 Explained the mechanism of APL ab. Only repeat positive is considered to be truly positive Plan: recheck in 3 months

## 2020-09-26 ENCOUNTER — Inpatient Hospital Stay (HOSPITAL_BASED_OUTPATIENT_CLINIC_OR_DEPARTMENT_OTHER): Payer: BC Managed Care – PPO | Admitting: Hematology and Oncology

## 2020-09-26 DIAGNOSIS — I82401 Acute embolism and thrombosis of unspecified deep veins of right lower extremity: Secondary | ICD-10-CM

## 2020-09-27 LAB — FACTOR 5 LEIDEN

## 2020-09-27 LAB — PROTHROMBIN GENE MUTATION

## 2020-09-28 ENCOUNTER — Telehealth: Payer: Self-pay | Admitting: Hematology and Oncology

## 2020-09-28 ENCOUNTER — Other Ambulatory Visit: Payer: Self-pay | Admitting: Hematology and Oncology

## 2020-09-28 DIAGNOSIS — I82411 Acute embolism and thrombosis of right femoral vein: Secondary | ICD-10-CM

## 2020-09-28 MED ORDER — RIVAROXABAN 20 MG PO TABS: 20.0000 mg | ORAL_TABLET | Freq: Every day | ORAL | 11 refills | Status: DC

## 2020-09-28 NOTE — Progress Notes (Signed)
I informed the patient that factor V Leiden and prothrombin gene mutations were negative. She wanted [place] to take over prescribing of Xarelto.  Therefore I sent a new prescription to her [place].

## 2020-09-28 NOTE — Telephone Encounter (Signed)
Scheduled per 4/11 los. Called and spoke with pt, confirmed 4/11 and 4/15 appts

## 2020-12-23 ENCOUNTER — Other Ambulatory Visit: Payer: Self-pay | Admitting: Hematology and Oncology

## 2020-12-23 DIAGNOSIS — I82411 Acute embolism and thrombosis of right femoral vein: Secondary | ICD-10-CM

## 2020-12-26 ENCOUNTER — Inpatient Hospital Stay: Payer: BC Managed Care – PPO | Attending: Hematology and Oncology

## 2020-12-26 ENCOUNTER — Other Ambulatory Visit: Payer: Self-pay

## 2020-12-26 DIAGNOSIS — Z7901 Long term (current) use of anticoagulants: Secondary | ICD-10-CM | POA: Diagnosis not present

## 2020-12-26 DIAGNOSIS — I82811 Embolism and thrombosis of superficial veins of right lower extremities: Secondary | ICD-10-CM | POA: Insufficient documentation

## 2020-12-26 DIAGNOSIS — I82411 Acute embolism and thrombosis of right femoral vein: Secondary | ICD-10-CM

## 2020-12-29 LAB — DRVVT CONFIRM: dRVVT Confirm: 1.3 ratio — ABNORMAL HIGH (ref 0.8–1.2)

## 2020-12-29 LAB — DRVVT MIX: dRVVT Mix: 62.1 s — ABNORMAL HIGH (ref 0.0–40.4)

## 2020-12-29 LAB — LUPUS ANTICOAGULANT PANEL
DRVVT: 77 s — ABNORMAL HIGH (ref 0.0–47.0)
PTT Lupus Anticoagulant: 31.5 s (ref 0.0–51.9)

## 2020-12-29 NOTE — Progress Notes (Signed)
  HEMATOLOGY-ONCOLOGY TELEPHONE VISIT PROGRESS NOTE  I connected with [name] on 12/30/2020 at  9:15 AM EDT by telephone and verified that I am speaking with the correct person using two identifiers.  I discussed the limitations, risks, security and privacy concerns of performing an evaluation and management service by telephone and the availability of in person appointments.  I also discussed with the patient that there may be a patient responsible charge related to this service. The patient expressed understanding and agreed to proceed.   History of Present Illness: [name] is a 65 y.o. female with above-mentioned history of right lower leg DVT currently on Xarelto. She presents via telephone today for follow-up.  She is complaining of right leg pain intermittently.  Observations/Objective:     Assessment Plan:  DVT (deep venous thrombosis) (HCC) First DVT 2018 status post knee surgery Acute DVT of the right lower extremity. She is referred by [facility]. She presented to her PCP for right leg pain and had a history of DVT. [place] on 08/04/20 showed acute superficial vein thrombosis involving the right great saphenous vein. [place] on 23-Aug-2020 showed a thrombus involving the great saphenous vein at the saphofemoral junction, extending slightly into the common femoral vein.    Current Treatment: Xarelto started on 2020-08-23. Grandmother died of blood clots, her mother and her 5 sisters all had blood clots  Hypercoagulability work-up:  Negative blood (factor V Leiden, prothrombin gene mutation, protein C, protein S, Antithrombin III: All normal) Antiphospholipid antibody positive by repeat testing 3 months apart.  My recommendation is indefinite anticoagulation.  We also discussed that Xarelto tends to be less effective than Coumadin in this situation.  However given the convenience and tolerance of Xarelto she wants to remain on it.  Ultrasound of the right leg will be  performed in 3 months.  Telephone visit after that to discuss results.    I discussed the assessment and treatment plan with the patient. The patient was provided an opportunity to ask questions and all were answered. The patient agreed with the plan and demonstrated an understanding of the instructions. The patient was advised to call back or seek an in-person evaluation if the symptoms worsen or if the condition fails to improve as anticipated.   Total time spent: 12 mins including non-face to face time and time spent for planning, charting and coordination of care  Rulon Eisenmenger, MD 12/30/2020    I, Thana Ates, am acting as scribe for Nicholas Lose, MD.  I have reviewed the above documentation for accuracy and completeness, and I agree with the above.

## 2020-12-30 ENCOUNTER — Inpatient Hospital Stay (HOSPITAL_BASED_OUTPATIENT_CLINIC_OR_DEPARTMENT_OTHER): Payer: BC Managed Care – PPO | Admitting: Hematology and Oncology

## 2020-12-30 DIAGNOSIS — I82411 Acute embolism and thrombosis of right femoral vein: Secondary | ICD-10-CM

## 2020-12-30 DIAGNOSIS — I82401 Acute embolism and thrombosis of unspecified deep veins of right lower extremity: Secondary | ICD-10-CM | POA: Diagnosis not present

## 2020-12-30 MED ORDER — RIVAROXABAN 20 MG PO TABS: 20.0000 mg | ORAL_TABLET | Freq: Every day | ORAL | 3 refills | Status: DC

## 2020-12-30 NOTE — Assessment & Plan Note (Signed)
First DVT 2018 status post knee surgery Acute DVT of the right lower extremity. She is referred by [facility]. She presented to her PCP for right leg pain and had a history of DVT. [place] on 08/04/20 showed acute superficial vein thrombosis involving the right great saphenous vein. [place] on 09-13-2020 showed a thrombus involving the great saphenous vein at the saphofemoral junction, extending slightly into the common femoral vein.  Current Treatment:Xareltostartedon 09/13/2020. Grandmother died of blood clots, her mother and her 5 sisters all had blood clots  Hypercoagulability work-up:  Negative blood (factor V Leiden, prothrombin gene mutation, protein C, protein S, Antithrombin III: All normal) Antiphospholipid antibody positive by repeat testing 3 months apart.  My recommendation is indefinite anticoagulation.  We also discussed that Xarelto tends to be less effective than Coumadin in this situation.  However given the convenience and tolerance of Xarelto she wants to remain on it.  Return to clinic in 1 year for follow-up

## 2021-01-03 ENCOUNTER — Telehealth: Payer: Self-pay | Admitting: Hematology and Oncology

## 2021-01-03 NOTE — Telephone Encounter (Signed)
Scheduled per 7/15 los. Called and spoke with pt confirmed added appts

## 2021-01-24 DIAGNOSIS — F32A Depression, unspecified: Secondary | ICD-10-CM | POA: Diagnosis not present

## 2021-01-24 DIAGNOSIS — I10 Essential (primary) hypertension: Secondary | ICD-10-CM | POA: Diagnosis not present

## 2021-01-24 DIAGNOSIS — D6861 Antiphospholipid syndrome: Secondary | ICD-10-CM | POA: Diagnosis not present

## 2021-01-24 DIAGNOSIS — G609 Hereditary and idiopathic neuropathy, unspecified: Secondary | ICD-10-CM | POA: Diagnosis not present

## 2021-03-21 ENCOUNTER — Other Ambulatory Visit: Payer: Self-pay

## 2021-03-21 ENCOUNTER — Ambulatory Visit (HOSPITAL_COMMUNITY)
Admission: RE | Admit: 2021-03-21 | Discharge: 2021-03-21 | Disposition: A | Discharge status: Home / Self Care | Payer: BC Managed Care – PPO | Source: Ambulatory Visit | Attending: Hematology and Oncology | Admitting: Hematology and Oncology

## 2021-03-21 DIAGNOSIS — I82401 Acute embolism and thrombosis of unspecified deep veins of right lower extremity: Secondary | ICD-10-CM | POA: Diagnosis not present

## 2021-03-21 DIAGNOSIS — I82411 Acute embolism and thrombosis of right femoral vein: Secondary | ICD-10-CM | POA: Diagnosis not present

## 2021-03-21 NOTE — Progress Notes (Signed)
Right lower extremity venous duplex has been completed. Preliminary results can be found in CV Proc through chart review.  Results were given to Dr. Lindi Adie.  03/21/21 10:05 AM [name] RVT

## 2021-04-03 NOTE — Progress Notes (Signed)
Patient Care Team: Just, Laurita Quint, FNP (Inactive) as PCP - General (Family Medicine)  DIAGNOSIS:    ICD-10-CM   1. Acute deep vein thrombosis (DVT) of right lower extremity, unspecified vein (HCC)  I82.401       CHIEF COMPLIANT: Follow-up of right lower leg DVT currently on Xarelto  INTERVAL HISTORY: [name] is a 65 y.o. with above-mentioned history of right lower leg DVT currently on Xarelto. She presents to the clinic today for follow-up.  She is complaining of severe right hip pain which is making it very difficult for her to function at home especially with bending and leaning forward.  She is having severe pain and difficulties.  She is taking lots of Tylenol as but avoiding any NSAIDs because she is currently on blood thinners.  This started about July and it has been continuing without any relief.  Gabapentin is not helping her.  ALLERGIES:  is allergic to penicillins, shellfish-derived products, and morphine and related.  MEDICATIONS:  Current Outpatient Medications  Medication Sig Dispense Refill   atorvastatin (LIPITOR) 40 MG tablet Take 1 tablet (40 mg total) by mouth daily. 90 tablet 3   citalopram (CELEXA) 20 MG tablet Take 1 tablet (20 mg total) by mouth daily. 30 tablet 3   gabapentin (NEURONTIN) 100 MG capsule Take 1 capsule (100 mg total) by mouth 3 (three) times daily. 90 capsule 3   hydrochlorothiazide (HYDRODIURIL) 25 MG tablet Take 1 tablet (25 mg total) by mouth daily. 90 tablet 3   metoprolol succinate (TOPROL-XL) 25 MG 24 hr tablet Take 25 mg by mouth daily.     rivaroxaban (XARELTO) 20 MG TABS tablet Take 1 tablet (20 mg total) by mouth daily with supper. 90 tablet 3   valsartan (DIOVAN) 80 MG tablet Take 1 tablet (80 mg total) by mouth daily. 90 tablet 3   No current facility-administered medications for this visit.    PHYSICAL EXAMINATION: ECOG PERFORMANCE STATUS: 1 - Symptomatic but completely ambulatory  There were no vitals filed for this  visit. There were no vitals filed for this visit.  LABORATORY DATA:  I have reviewed the data as listed CMP Latest Ref Rng & Units 07/26/2020 07/22/2016 12/13/2013  Glucose 65 - 99 mg/dL 108(H) 121(H) 94  BUN 8 - 27 mg/dL 15 17 13   Creatinine 0.57 - 1.00 mg/dL 0.74 0.66 1.00  Sodium 134 - 144 mmol/L 140 140 142  Potassium 3.5 - 5.2 mmol/L 4.1 3.6 3.5(L)  Chloride 96 - 106 mmol/L 102 108 102  CO2 20 - 29 mmol/L 19(L) 24 -  Calcium 8.7 - 10.3 mg/dL 9.6 9.6 -  Total Protein 6.0 - 8.5 g/dL 7.1 - -  Total Bilirubin 0.0 - 1.2 mg/dL 1.1 - -  Alkaline Phos 44 - 121 IU/L 117 - -  AST 0 - 40 IU/L 18 - -  ALT 0 - 32 IU/L 25 - -    Lab Results  Component Value Date   WBC 5.6 07/26/2020   HGB 15.0 07/26/2020   HCT 43.9 07/26/2020   MCV 89 07/26/2020   PLT 246 07/26/2020   NEUTROABS 4.0 07/26/2020    ASSESSMENT & PLAN:  DVT (deep venous thrombosis) (HCC) First DVT 2018 status post knee surgery Acute DVT of the right lower extremity. She is referred by [facility]. She presented to her PCP for right leg pain and had a history of DVT. [place] on 08/04/20 showed acute superficial vein thrombosis involving the right great saphenous  vein. [place] on Sep 05, 2020 showed a thrombus involving the great saphenous vein at the saphofemoral junction, extending slightly into the common femoral vein.    Current Treatment: Xarelto started on 2020/09/05. Grandmother died of blood clots, her mother and her 5 sisters all had blood clots   Hypercoagulability work-up:  Negative blood (factor V Leiden, prothrombin gene mutation, protein C, protein S, Antithrombin III: All normal) Antiphospholipid antibody positive by repeat testing 3 months apart.   My recommendation is indefinite anticoagulation.  We also discussed that Xarelto tends to be less effective than Coumadin in this situation.  However given the convenience and tolerance of Xarelto she wants to remain on it.   Ultrasound of the right leg 03/21/2021: No evidence  of DVT Continue with indefinite anticoagulation.  Severe right hip pain: I sent her for an x-ray today.  We will order an MRI of her right hip for further evaluation.  This does not appear to be neuropathy or blood clot related pain.  Therefore we will need to investigate if there is another reason for it.  This hip pain is limiting her ability to work at [employer]. We will call her tomorrow with the result of the x-ray.    No orders of the defined types were placed in this encounter.  The patient has a good understanding of the overall plan. she agrees with it. she will call with any problems that may develop before the next visit here.  Total time spent: 30 mins including face to face time and time spent for planning, charting and coordination of care  Rulon Eisenmenger, MD, MPH 04/04/2021  I, Thana Ates, am acting as scribe for Dr. Nicholas Lose.  I have reviewed the above documentation for accuracy and completeness, and I agree with the above.

## 2021-04-04 ENCOUNTER — Inpatient Hospital Stay: Payer: BC Managed Care – PPO | Attending: Hematology and Oncology | Admitting: Hematology and Oncology

## 2021-04-04 ENCOUNTER — Ambulatory Visit (HOSPITAL_COMMUNITY)
Admission: RE | Admit: 2021-04-04 | Discharge: 2021-04-04 | Disposition: A | Discharge status: Home / Self Care | Payer: BC Managed Care – PPO | Source: Ambulatory Visit | Attending: Hematology and Oncology | Admitting: Hematology and Oncology

## 2021-04-04 ENCOUNTER — Other Ambulatory Visit: Payer: Self-pay

## 2021-04-04 VITALS — BP 147/68 | HR 78 | Temp 97.8°F | Resp 18 | Ht 66.0 in | Wt 197.0 lb

## 2021-04-04 DIAGNOSIS — M25551 Pain in right hip: Secondary | ICD-10-CM

## 2021-04-04 DIAGNOSIS — I82401 Acute embolism and thrombosis of unspecified deep veins of right lower extremity: Secondary | ICD-10-CM | POA: Diagnosis not present

## 2021-04-04 DIAGNOSIS — M1611 Unilateral primary osteoarthritis, right hip: Secondary | ICD-10-CM | POA: Diagnosis not present

## 2021-04-04 MED ORDER — DEXAMETHASONE 4 MG PO TABS: 4.0000 mg | ORAL_TABLET | Freq: Two times a day (BID) | ORAL | 0 refills | Status: DC

## 2021-04-04 NOTE — Assessment & Plan Note (Signed)
First DVT 2018 status post knee surgery Acute DVT of the right lower extremity. She is referred by [facility]. She presented to her PCP for right leg pain and had a history of DVT. [place] on 08/04/20 showed acute superficial vein thrombosis involving the right great saphenous vein. [place] on Sep 16, 2020 showed a thrombus involving the great saphenous vein at the saphofemoral junction, extending slightly into the common femoral vein.  Current Treatment:Xareltostartedon 2020/09/16. Grandmother died of blood clots, her mother and her 5 sisters all had blood clots  Hypercoagulability work-up:  Negative blood (factor V Leiden, prothrombin gene mutation, protein C, protein S, Antithrombin III: All normal) Antiphospholipid antibody positive by repeat testing 3 months apart.  My recommendation is indefinite anticoagulation.  We also discussed that Xarelto tends to be less effective than Coumadin in this situation.  However given the convenience and tolerance of Xarelto she wants to remain on it.  Ultrasound of the right leg 03/21/2021: No evidence of DVT Continue with indefinite anticoagulation.

## 2021-04-12 ENCOUNTER — Ambulatory Visit (HOSPITAL_COMMUNITY)
Admission: RE | Admit: 2021-04-12 | Discharge: 2021-04-12 | Disposition: A | Discharge status: Home / Self Care | Payer: BC Managed Care – PPO | Source: Ambulatory Visit | Attending: Hematology and Oncology | Admitting: Hematology and Oncology

## 2021-04-12 ENCOUNTER — Other Ambulatory Visit: Payer: Self-pay

## 2021-04-12 DIAGNOSIS — M25551 Pain in right hip: Secondary | ICD-10-CM | POA: Diagnosis not present

## 2021-04-12 DIAGNOSIS — Z90710 Acquired absence of both cervix and uterus: Secondary | ICD-10-CM | POA: Diagnosis not present

## 2021-04-12 DIAGNOSIS — M1611 Unilateral primary osteoarthritis, right hip: Secondary | ICD-10-CM | POA: Diagnosis not present

## 2021-04-12 MED ORDER — GADOBUTROL 1 MMOL/ML IV SOLN
10.0000 mL | Freq: Once | INTRAVENOUS | Status: AC | PRN
  Administered 2021-04-12: 16:00:00 10 mL via INTRAVENOUS

## 2021-04-12 NOTE — Progress Notes (Signed)
  HEMATOLOGY-ONCOLOGY TELEPHONE VISIT PROGRESS NOTE  I connected with [name] on 04/14/2021 at 10:45 AM EDT by telephone and verified that I am speaking with the correct person using two identifiers.  I discussed the limitations, risks, security and privacy concerns of performing an evaluation and management service by telephone and the availability of in person appointments.  I also discussed with the patient that there may be a patient responsible charge related to this service. The patient expressed understanding and agreed to proceed.   History of Present Illness: [name] is a 64 y.o. female with above-mentioned history of right lower leg DVT currently on Xarelto. She presents via telephone today for follow-up. She continues to have severe pain in the right hip.  She had an MRI of the hip and the results are not yet available.  She took prednisone which immediately resolved her pain but when she stops it the pain comes back.  Observations/Objective:     Assessment Plan:  Acute thromboembolism of deep veins of right lower extremity (HCC) First DVT 2018 status post knee surgery Acute DVT of the right lower extremity. She is referred by [facility]. She presented to her PCP for right leg pain and had a history of DVT. [place] on 08/04/20 showed acute superficial vein thrombosis involving the right great saphenous vein. [place] on 2020-08-30 showed a thrombus involving the great saphenous vein at the saphofemoral junction, extending slightly into the common femoral vein.    Current Treatment: Xarelto started on 2020-08-30. Grandmother died of blood clots, her mother and her 5 sisters all had blood clots   Hypercoagulability work-up:  Negative blood (factor V Leiden, prothrombin gene mutation, protein C, protein S, Antithrombin III: All normal) Antiphospholipid antibody positive by repeat testing 3 months apart.   My recommendation is indefinite anticoagulation.  We also discussed  that Xarelto tends to be less effective than Coumadin in this situation.  However given the convenience and tolerance of Xarelto she wants to remain on it.   Ultrasound of the right leg 03/21/2021: No evidence of DVT Continue with indefinite anticoagulation.   Severe right hip pain:  X-ray right hip 04/05/2021: Mild to moderate right hip osteoarthritis MRI performed 04/12/2021: Radiology has not read her MRI report. Prednisone remarkably resolves her pain but she does not want to stay on prednisone therefore she stopped it. She will need orthopedic referral depending on what we find on the MRI.   I discussed the assessment and treatment plan with the patient. The patient was provided an opportunity to ask questions and all were answered. The patient agreed with the plan and demonstrated an understanding of the instructions. The patient was advised to call back or seek an in-person evaluation if the symptoms worsen or if the condition fails to improve as anticipated.   Total time spent: 12 mins including non-face to face time and time spent for planning, charting and coordination of care  Rulon Eisenmenger, MD 04/14/2021    I, Thana Ates, am acting as scribe for Nicholas Lose, MD.  I have reviewed the above documentation for accuracy and completeness, and I agree with the above.

## 2021-04-14 ENCOUNTER — Inpatient Hospital Stay (HOSPITAL_BASED_OUTPATIENT_CLINIC_OR_DEPARTMENT_OTHER): Payer: BC Managed Care – PPO | Admitting: Hematology and Oncology

## 2021-04-14 DIAGNOSIS — I82401 Acute embolism and thrombosis of unspecified deep veins of right lower extremity: Secondary | ICD-10-CM

## 2021-04-14 NOTE — Assessment & Plan Note (Signed)
First DVT 2018 status post knee surgery Acute DVT of the right lower extremity. She is referred by [facility]. She presented to her PCP for right leg pain and had a history of DVT. [place] on 08/04/20 showed acute superficial vein thrombosis involving the right great saphenous vein. [place] on September 09, 2020 showed a thrombus involving the great saphenous vein at the saphofemoral junction, extending slightly into the common femoral vein.  Current Treatment:Xareltostartedon 09-Sep-2020. Grandmother died of blood clots, her mother and her 5 sisters all had blood clots  Hypercoagulability work-up: Negative blood (factor V Leiden, prothrombin gene mutation, protein C, protein S, Antithrombin III: All normal) Antiphospholipid antibody positive by repeat testing 3 months apart.  My recommendation is indefinite anticoagulation. We also discussed that Xarelto tends to be less effective than Coumadin in this situation. However given the convenience and tolerance of Xarelto she wants toremain on it.  Ultrasound of the right leg 03/21/2021: No evidence of DVT Continue with indefinite anticoagulation.  Severe right hip pain:  X-ray right hip 04/05/2021: Mild to moderate right hip osteoarthritis MRI performed 04/12/2021:

## 2021-04-18 ENCOUNTER — Encounter: Payer: Self-pay | Admitting: Hematology and Oncology

## 2021-04-19 ENCOUNTER — Other Ambulatory Visit: Payer: Self-pay | Admitting: *Deleted

## 2021-04-19 DIAGNOSIS — M1611 Unilateral primary osteoarthritis, right hip: Secondary | ICD-10-CM

## 2021-04-19 NOTE — Progress Notes (Signed)
Per MD request RN successfully placed referral to [facility] for evaluation of right hip pain.

## 2021-04-28 ENCOUNTER — Ambulatory Visit (INDEPENDENT_AMBULATORY_CARE_PROVIDER_SITE_OTHER): Payer: BC Managed Care – PPO | Admitting: Orthopaedic Surgery

## 2021-04-28 ENCOUNTER — Other Ambulatory Visit: Payer: Self-pay

## 2021-04-28 DIAGNOSIS — M1611 Unilateral primary osteoarthritis, right hip: Secondary | ICD-10-CM

## 2021-04-28 NOTE — Progress Notes (Addendum)
Office Visit Note   Patient: [name]           Date of Birth: 1955/08/30           MRN: [id number] Visit Date: 04/28/2021              Requested by: Nicholas Lose, MD [place],  [place] [id number] PCP: Just, Laurita Quint, FNP (Inactive)   Assessment & Plan: Visit Diagnoses:  1. Primary osteoarthritis of right hip     Plan: Impression is end-stage right hip degenerative joint disease.  X-rays and MRI were both reviewed with the patient in detail and treatment options were provided to include conservative versus surgical management.  Based on her options she is not interested in cortisone injections as these are only temporary and would like a more definitive treatment such as a hip replacement.  She understands that there is a risk of developing a DVT or even a PE.  She is unable to take NSAIDs due to being on Xarelto.  She will need necessary clearance and likely bridging of her Xarelto for the surgery.  We will coordinate with Dr. Lindi Adie on this.  Risk benefits rehab recovery of hip replacement reviewed with the patient detail.  Follow-Up Instructions: No follow-ups on file.   Orders:  No orders of the defined types were placed in this encounter.  No orders of the defined types were placed in this encounter.     Procedures: No procedures performed   Clinical Data: No additional findings.   Subjective: Chief Complaint  Patient presents with   Right Hip - Pain    [name] is a pleasant 65 year old female here for evaluation of severe chronic right hip and groin pain since June and July of this year.  She was diagnosed with antiphospholipid protein during work-up for DVT in her leg.  She is on chronic Xarelto indefinitely as a result.  She has severe pain with sitting standing or bending.  She is barely able to perform any daily activities.  She is unable to sleep at night.  She is very frustrated and depressed as a result.   Review of Systems   Constitutional: Negative.   HENT: Negative.    Eyes: Negative.   Respiratory: Negative.    Cardiovascular: Negative.   Endocrine: Negative.   Musculoskeletal: Negative.   Neurological: Negative.   Hematological: Negative.   Psychiatric/Behavioral: Negative.    All other systems reviewed and are negative.   Objective: Vital Signs: There were no vitals taken for this visit.  Physical Exam Vitals and nursing note reviewed.  Constitutional:      Appearance: She is well-developed.  Pulmonary:     Effort: Pulmonary effort is normal.  Skin:    General: Skin is warm.     Capillary Refill: Capillary refill takes less than 2 seconds.  Neurological:     Mental Status: She is alert and oriented to person, place, and time.  Psychiatric:        Behavior: Behavior normal.        Thought Content: Thought content normal.        Judgment: Judgment normal.    Ortho Exam  Right hip shows very limited hip flexion due to severe pain past 45 degrees.  She has almost no external or internal rotation secondary to pain and guarding.  Specialty Comments:  No specialty comments available.  Imaging: No results found.   PMFS History: Patient Active Problem List   Diagnosis  Date Noted   DVT (deep venous thrombosis) (HCC)    Acute thromboembolism of deep veins of right lower extremity ([place]) 07/26/2020   Depression 07/15/2007   COLLAGENOUS COLITIS 07/15/2007   WEIGHT LOSS 07/15/2007   HEADACHE 07/15/2007   COLONIC POLYPS, BENIGN 02/19/2001   Past Medical History:  Diagnosis Date   DVT (deep venous thrombosis) (HCC)    Hypercholesteremia    Hypertension    Renal disorder    kidney stones    Family History  Problem Relation Age of Onset   Lupus Mother    Parkinson's disease Mother    Cancer Father    Cancer Sister    Cancer Brother     Past Surgical History:  Procedure Laterality Date   ABDOMINAL HYSTERECTOMY     BREAST LUMPECTOMY     BUNIONECTOMY     Social History    Occupational History   Not on file  Tobacco Use   Smoking status: Former    Packs/day: 1.00    Years: 42.00    Pack years: 42.00    Types: Cigarettes    Quit date: 07/27/2015    Years since quitting: 5.7   Smokeless tobacco: Never  Substance and Sexual Activity   Alcohol use: Yes    Alcohol/week: 0.0 standard drinks    Comment: occasional   Drug use: No   Sexual activity: Yes    Birth control/protection: Condom

## 2021-05-11 ENCOUNTER — Encounter: Payer: Self-pay | Admitting: Orthopaedic Surgery

## 2021-05-18 ENCOUNTER — Telehealth: Payer: Self-pay | Admitting: Orthopaedic Surgery

## 2021-05-18 NOTE — Telephone Encounter (Signed)
Pt called requesting a call back from Dr. Erlinda Hong or Kathlee Nations concerning hip replacement surgery. Pt states Dr. Erlinda Hong was to consult with pt's hemologist she believes . Please call pt at [phone].

## 2021-06-29 ENCOUNTER — Encounter: Payer: Self-pay | Admitting: *Deleted

## 2021-06-29 NOTE — Progress Notes (Signed)
Received surgery clearance from Dr. Erlinda Hong office with [facility] requesting medical clearance for pt to undergo total right hip replacement with spinal.  Per MD pt needing to stop Xarelto 3 days prior to surgery.  During the 3 days prior to surgery pt will need Lovenox 80 mg subcu BID, no anticoagulation day of surgery, and resume Xarelto day after surgery if pt is not actively bleeding.  Medical clearance faxed back to [facility] with MD recommendations and to alert our office if we need to send in Lovenox or if Dr. Erlinda Hong will.  Lovenox not called into pharmacy at this time due to no known surgery date.

## 2021-06-30 ENCOUNTER — Other Ambulatory Visit: Payer: Self-pay | Admitting: *Deleted

## 2021-06-30 MED ORDER — ENOXAPARIN SODIUM 80 MG/0.8ML IJ SOSY: 80.0000 mg | PREFILLED_SYRINGE | Freq: Two times a day (BID) | INTRAMUSCULAR | 0 refills | Status: DC

## 2021-06-30 NOTE — Progress Notes (Signed)
Per MD request orders placed for pt to self administer Lovenox 80 mg Subqu Q 12 hrs starting 3 days prior to surgery.  Right total hip currently scheduled for 07/31/2021.  Orders sent to pharmacy on file.  RN educated pt to take last Xarelto tablet 07/27/2020.  Pt educated she will self administer Lovenox 07/28/21-07/30/21 and re-start Xarelto day after surgery if no bleeding occurs.  Pt states she will discuss with pharmacist on administration instructions and will contact our office with any questions or concerns.

## 2021-07-05 ENCOUNTER — Other Ambulatory Visit: Payer: Self-pay | Admitting: Physician Assistant

## 2021-07-25 ENCOUNTER — Other Ambulatory Visit: Payer: Self-pay | Admitting: Physician Assistant

## 2021-07-25 MED ORDER — ONDANSETRON HCL 4 MG PO TABS: 4.0000 mg | ORAL_TABLET | Freq: Three times a day (TID) | ORAL | 0 refills | Status: DC | PRN

## 2021-07-25 MED ORDER — METHOCARBAMOL 500 MG PO TABS: 500.0000 mg | ORAL_TABLET | Freq: Two times a day (BID) | ORAL | 0 refills | Status: DC | PRN

## 2021-07-25 MED ORDER — OXYCODONE-ACETAMINOPHEN 5-325 MG PO TABS: 1.0000 | ORAL_TABLET | Freq: Four times a day (QID) | ORAL | 0 refills | Status: DC | PRN

## 2021-07-25 MED ORDER — DOCUSATE SODIUM 100 MG PO CAPS: 100.0000 mg | ORAL_CAPSULE | Freq: Every day | ORAL | 2 refills | Status: DC | PRN

## 2021-07-26 NOTE — Progress Notes (Signed)
Surgical Instructions    Your procedure is scheduled on 07/31/21.  Report to [facility] Main Entrance "A" at 5:30 A.M., then check in with the Admitting office.  Call this number if you have problems the morning of surgery:  [phone]   If you have any questions prior to your surgery date call [phone]: Open Monday-Friday 8am-4pm    Remember:  Do not eat after midnight the night before your surgery  You may drink clear liquids until 4:30 the morning of your surgery.   Clear liquids allowed are: Water, Non-Citrus Juices (without pulp), Carbonated Beverages, Clear Tea, Black Coffee ONLY (NO MILK, CREAM OR POWDERED CREAMER of any kind), and Gatorade  Please complete your PRE-SURGERY ENSURE that was provided to you by 4:30 the morning of surgery.  Please, if able, drink it in one setting. DO NOT SIP.     Take these medicines the morning of surgery with A SIP OF WATER:  atorvastatin (LIPITOR) citalopram (CELEXA)  metoprolol succinate (TOPROL-XL) ondansetron (ZOFRAN) - as needed  Follow your surgeon's instructions on when to stop Xarelto.  If no instructions were given by your surgeon then you will need to call the office to get those instructions.     Follow your surgeon's instructions on Lovenox injections.    As of today, STOP taking any Aspirin (unless otherwise instructed by your surgeon) Aleve, Naproxen, Ibuprofen, Motrin, Advil, Goody's, BC's, all herbal medications, fish oil, and all vitamins.           Do not wear jewelry or makeup Do not wear lotions, powders, perfumes or deodorant. Do not shave 48 hours prior to surgery.   Do not bring valuables to the hospital. Do not wear nail polish, gel polish, artificial nails, or any other type of covering on natural nails (fingers and toes) If you have artificial nails or gel coating that need to be removed by a nail salon, please have this removed prior to surgery. Artificial nails or gel coating may interfere with  anesthesia's ability to adequately monitor your vital signs.  [facility] is not responsible for any belongings or valuables. .   Do NOT Smoke (Tobacco/Vaping)  24 hours prior to your procedure  If you use a CPAP at night, you may bring your mask for your overnight stay.   Contacts, glasses, hearing aids, dentures or partials may not be worn into surgery, please bring cases for these belongings   For patients admitted to the hospital, discharge time will be determined by your treatment team.   Patients discharged the day of surgery will not be allowed to drive home, and someone needs to stay with them for 24 hours.  NO VISITORS WILL BE ALLOWED IN PRE-OP WHERE PATIENTS ARE PREPPED FOR SURGERY.  ONLY 1 SUPPORT PERSON MAY BE PRESENT IN THE WAITING ROOM WHILE YOU ARE IN SURGERY.  IF YOU ARE TO BE ADMITTED, ONCE YOU ARE IN YOUR ROOM YOU WILL BE ALLOWED TWO (2) VISITORS. 1 (ONE) VISITOR MAY STAY OVERNIGHT BUT MUST ARRIVE TO THE ROOM BY 8pm.  Minor children may have two parents present. Special consideration for safety and communication needs will be reviewed on a case by case basis.  Special instructions:    Oral Hygiene is also important to reduce your risk of infection.  Remember - BRUSH YOUR TEETH THE MORNING OF SURGERY WITH YOUR REGULAR TOOTHPASTE   [facility]- Preparing For Surgery  Before surgery, you can play an important role. Because skin is not sterile, your skin  needs to be as free of germs as possible. You can reduce the number of germs on your skin by washing with CHG (chlorahexidine gluconate) Soap before surgery.  CHG is an antiseptic cleaner which kills germs and bonds with the skin to continue killing germs even after washing.     Please do not use if you have an allergy to CHG or antibacterial soaps. If your skin becomes reddened/irritated stop using the CHG.  Do not shave (including legs and underarms) for at least 48 hours prior to first CHG shower. It is OK to shave your  face.  Please follow these instructions carefully.     Shower the NIGHT BEFORE SURGERY and the MORNING OF SURGERY with CHG Soap.   If you chose to wash your hair, wash your hair first as usual with your normal shampoo. After you shampoo, rinse your hair and body thoroughly to remove the shampoo.  Then [employer] and genitals (private parts) with your normal soap and rinse thoroughly to remove soap.  After that Use CHG Soap as you would any other liquid soap. You can apply CHG directly to the skin and wash gently with a scrungie or a clean washcloth.   Apply the CHG Soap to your body ONLY FROM THE NECK DOWN.  Do not use on open wounds or open sores. Avoid contact with your eyes, ears, mouth and genitals (private parts). Wash Face and genitals (private parts)  with your normal soap.   Wash thoroughly, paying special attention to the area where your surgery will be performed.  Thoroughly rinse your body with warm water from the neck down.  DO NOT shower/wash with your normal soap after using and rinsing off the CHG Soap.  Pat yourself dry with a CLEAN TOWEL.  Wear CLEAN PAJAMAS to bed the night before surgery  Place CLEAN SHEETS on your bed the night before your surgery  DO NOT SLEEP WITH PETS.   Day of Surgery:  Take a shower with CHG soap. Wear Clean/Comfortable clothing the morning of surgery Do not apply any deodorants/lotions.   Remember to brush your teeth WITH YOUR REGULAR TOOTHPASTE.    COVID testing  If you are going to stay overnight or be admitted after your procedure/surgery and require a pre-op COVID test, please follow these instructions after your COVID test   You are not required to quarantine however you are required to wear a well-fitting mask when you are out and around people not in your household.  If your mask becomes wet or soiled, replace with a new one.  Wash your hands often with soap and water for 20 seconds or clean your hands with an alcohol-based  hand sanitizer that contains at least 60% alcohol.  Do not share personal items.  Notify your provider: if you are in close contact with someone who has COVID  or if you develop a fever of 100.4 or greater, sneezing, cough, sore throat, shortness of breath or body aches.    Please read over the following fact sheets that you were given.

## 2021-07-27 ENCOUNTER — Inpatient Hospital Stay (HOSPITAL_COMMUNITY)
Admission: RE | Admit: 2021-07-27 | Discharge: 2021-07-27 | Disposition: A | Discharge status: Home / Self Care | Payer: BC Managed Care – PPO | Source: Ambulatory Visit

## 2021-07-28 ENCOUNTER — Ambulatory Visit (INDEPENDENT_AMBULATORY_CARE_PROVIDER_SITE_OTHER): Payer: BC Managed Care – PPO | Admitting: Orthopaedic Surgery

## 2021-07-28 ENCOUNTER — Other Ambulatory Visit: Payer: Self-pay

## 2021-07-28 VITALS — Ht 65.5 in | Wt 202.8 lb

## 2021-07-28 DIAGNOSIS — M1611 Unilateral primary osteoarthritis, right hip: Secondary | ICD-10-CM

## 2021-07-28 NOTE — Progress Notes (Signed)
Office Visit Note   Patient: [name]           Date of Birth: 01-14-1956           MRN: [id number] Visit Date: 07/28/2021              Requested by: No referring provider defined for this encounter. PCP: Just, Laurita Quint, FNP (Inactive)   Assessment & Plan: Visit Diagnoses:  1. Primary osteoarthritis of right hip     Plan: Impression is end-stage right hip DJD with bone-on-bone joint space narrowing with failure of conservative management such as weight loss, joint injection, physical therapy, use of cane for ambulation.  We had a long discussion about the importance of having a healthy BMI of less than 30 today.  She understands the potential risks benefits rehab recovery prognosis from hip replacement surgery.  She is in severe pain and is unable to exercise enough to further lose weight.  The goal is to provide her with pain relief in her hip so that she can exercise to lose weight.  We will submit the request for hip replacement today.  She has been instructed to continue her baseline Xarelto until we know when her surgery has been rescheduled so that we can bridge with Lovenox.  Follow-Up Instructions: No follow-ups on file.   Orders:  No orders of the defined types were placed in this encounter.  No orders of the defined types were placed in this encounter.     Procedures: No procedures performed   Clinical Data: No additional findings.   Subjective: Chief Complaint  Patient presents with   Right Hip - Pain    HPI  [name] comes in today for follow-up of advanced right hip DJD with bone-on-bone arthritis.  Her surgery was canceled by her insurance company due to a BMI of greater than 30.  She continues to have severe pain with all ADLs and she is very limited and has no quality of life.  She is tearful because of how much pain she is in.  Review of Systems   Objective: Vital Signs: Ht 5' 5.5" (1.664 m)    Wt 202 lb 12.8 oz (92 kg)    BMI 33.23 kg/m    Physical Exam  Ortho Exam  Examination of right hip shows extreme pain with any attempted range of motion.  Antalgic gait.  Walks with a single-point cane.  Positive FADIR at 5 degrees.  Pain with hip flexion past 70 degrees.  Specialty Comments:  No specialty comments available.  Imaging: No results found.   PMFS History: Patient Active Problem List   Diagnosis Date Noted   DVT (deep venous thrombosis) (HCC)    Acute thromboembolism of deep veins of right lower extremity ([place]) 07/26/2020   Depression 07/15/2007   COLLAGENOUS COLITIS 07/15/2007   WEIGHT LOSS 07/15/2007   HEADACHE 07/15/2007   COLONIC POLYPS, BENIGN 02/19/2001   Past Medical History:  Diagnosis Date   DVT (deep venous thrombosis) (HCC)    Hypercholesteremia    Hypertension    Renal disorder    kidney stones    Family History  Problem Relation Age of Onset   Lupus Mother    Parkinson's disease Mother    Cancer Father    Cancer Sister    Cancer Brother     Past Surgical History:  Procedure Laterality Date   ABDOMINAL HYSTERECTOMY     BREAST LUMPECTOMY     BUNIONECTOMY     Social  History   Occupational History   Not on file  Tobacco Use   Smoking status: Former    Packs/day: 1.00    Years: 42.00    Pack years: 42.00    Types: Cigarettes    Quit date: 07/27/2015    Years since quitting: 6.0   Smokeless tobacco: Never  Substance and Sexual Activity   Alcohol use: Yes    Alcohol/week: 0.0 standard drinks    Comment: occasional   Drug use: No   Sexual activity: Yes    Birth control/protection: Condom

## 2021-07-31 ENCOUNTER — Encounter (HOSPITAL_COMMUNITY): Admission: RE | Payer: Self-pay | Source: Home / Self Care

## 2021-07-31 ENCOUNTER — Ambulatory Visit (HOSPITAL_COMMUNITY)
Admission: RE | Admit: 2021-07-31 | Payer: BC Managed Care – PPO | Source: Home / Self Care | Admitting: Orthopaedic Surgery

## 2021-07-31 DIAGNOSIS — Z96641 Presence of right artificial hip joint: Secondary | ICD-10-CM

## 2021-07-31 SURGERY — ARTHROPLASTY, HIP, TOTAL, ANTERIOR APPROACH
Anesthesia: Spinal | Site: Hip | Laterality: Right

## 2021-08-15 ENCOUNTER — Encounter: Payer: BC Managed Care – PPO | Admitting: Orthopaedic Surgery

## 2021-08-15 ENCOUNTER — Other Ambulatory Visit: Payer: Self-pay | Admitting: Physician Assistant

## 2021-08-22 NOTE — Pre-Procedure Instructions (Signed)
Surgical Instructions ? ? ? Your procedure is scheduled on Wednesday 08/30/21. ? ? Report to [facility] Main Entrance "A" at 12:35 P.M., then check in with the Admitting office. ? Call this number if you have problems the morning of surgery: ? [phone] ? ? If you have any questions prior to your surgery date call [phone]: Open Monday-Friday 8am-4pm ? ? ? Remember: ? Do not eat after midnight the night before your surgery ? ?You may drink clear liquids until 11:35 A.M. the morning of your surgery.   ?Clear liquids allowed are: Water, Non-Citrus Juices (without pulp), Carbonated Beverages, Clear Tea, Black Coffee ONLY (NO MILK, CREAM OR POWDERED CREAMER of any kind), and Gatorade ? ? ?Enhanced Recovery after Surgery for Orthopedics ?Enhanced Recovery after Surgery is a protocol used to improve the stress on your body and your recovery after surgery. ? ?Patient Instructions ? ?The day of surgery (if you do NOT have diabetes):  ?Drink ONE (1) Pre-Surgery Clear Ensure by 11:35 am the morning of surgery   ?This drink was given to you during your hospital  ?pre-op appointment visit. ?Nothing else to drink after completing the  ?Pre-Surgery Clear Ensure. ? ?       If you have questions, please contact your surgeon?s office.  ?  ? Take these medicines the morning of surgery with A SIP OF WATER:  ? atorvastatin (LIPITOR) ? citalopram (CELEXA)  ? metoprolol succinate (TOPROL-XL) ? ?  ?Please follow your surgeon's instructions regarding rivaroxaban (XARELTO). If you have not received instructions then please contact your surgeon's office for instructions. ? ?As of today, STOP taking any Aspirin (unless otherwise instructed by your surgeon) Aleve, Naproxen, Ibuprofen, Motrin, Advil, Goody's, BC's, all herbal medications, fish oil, and all vitamins. ? ?         ?Do not wear jewelry or makeup ?Do not wear lotions, powders, perfumes/colognes, or deodorant. ?Do not shave 48 hours prior to surgery.  Men may shave face and  neck. ?Do not bring valuables to the hospital. ?Do not wear nail polish, gel polish, artificial nails, or any other type of covering on natural nails (fingers and toes) ?If you have artificial nails or gel coating that need to be removed by a nail salon, please have this removed prior to surgery. Artificial nails or gel coating may interfere with anesthesia's ability to adequately monitor your vital signs. ? ? is not responsible for any belongings or valuables. .  ? ?Do NOT Smoke (Tobacco/Vaping)  24 hours prior to your procedure ? ?If you use a CPAP at night, you may bring your mask for your overnight stay. ?  ?Contacts, glasses, hearing aids, dentures or partials may not be worn into surgery, please bring cases for these belongings ?  ?For patients admitted to the hospital, discharge time will be determined by your treatment team. ?  ?Patients discharged the day of surgery will not be allowed to drive home, and someone needs to stay with them for 24 hours. ? ?NO VISITORS WILL BE ALLOWED IN PRE-OP WHERE PATIENTS ARE PREPPED FOR SURGERY.  ONLY 1 SUPPORT PERSON MAY BE PRESENT IN THE WAITING ROOM WHILE YOU ARE IN SURGERY.  IF YOU ARE TO BE ADMITTED, ONCE YOU ARE IN YOUR ROOM YOU WILL BE ALLOWED TWO (2) VISITORS. 1 (ONE) VISITOR MAY STAY OVERNIGHT BUT MUST ARRIVE TO THE ROOM BY 8pm.  Minor children may have two parents present. Special consideration for safety and communication needs will be reviewed on a case  by case basis. ? ?Special instructions:   ? ?Oral Hygiene is also important to reduce your risk of infection.  Remember - BRUSH YOUR TEETH THE MORNING OF SURGERY WITH YOUR REGULAR TOOTHPASTE ? ? ?[facility]- Preparing For Surgery ? ?Before surgery, you can play an important role. Because skin is not sterile, your skin needs to be as free of germs as possible. You can reduce the number of germs on your skin by washing with CHG (chlorahexidine gluconate) Soap before surgery.  CHG is an antiseptic  cleaner which kills germs and bonds with the skin to continue killing germs even after washing.   ? ? ?Please do not use if you have an allergy to CHG or antibacterial soaps. If your skin becomes reddened/irritated stop using the CHG.  ?Do not shave (including legs and underarms) for at least 48 hours prior to first CHG shower. It is OK to shave your face. ? ?Please follow these instructions carefully. ?  ? ? Shower the NIGHT BEFORE SURGERY and the MORNING OF SURGERY with CHG Soap.  ? If you chose to wash your hair, wash your hair first as usual with your normal shampoo. After you shampoo, rinse your hair and body thoroughly to remove the shampoo.  Then [employer] and genitals (private parts) with your normal soap and rinse thoroughly to remove soap. ? ?After that Use CHG Soap as you would any other liquid soap. You can apply CHG directly to the skin and wash gently with a scrungie or a clean washcloth.  ? ?Apply the CHG Soap to your body ONLY FROM THE NECK DOWN.  Do not use on open wounds or open sores. Avoid contact with your eyes, ears, mouth and genitals (private parts). Wash Face and genitals (private parts)  with your normal soap.  ? ?Wash thoroughly, paying special attention to the area where your surgery will be performed. ? ?Thoroughly rinse your body with warm water from the neck down. ? ?DO NOT shower/wash with your normal soap after using and rinsing off the CHG Soap. ? ?Pat yourself dry with a CLEAN TOWEL. ? ?Wear CLEAN PAJAMAS to bed the night before surgery ? ?Place CLEAN SHEETS on your bed the night before your surgery ? ?DO NOT SLEEP WITH PETS. ? ? ?Day of Surgery: ? ?Take a shower with CHG soap. ?Wear Clean/Comfortable clothing the morning of surgery ?Do not apply any deodorants/lotions.   ?Remember to brush your teeth WITH YOUR REGULAR TOOTHPASTE. ? ? ? ?COVID testing ? ?If you are going to stay overnight or be admitted after your procedure/surgery and require a pre-op COVID test, please follow  these instructions after your COVID test  ? ?You are not required to quarantine however you are required to wear a well-fitting mask when you are out and around people not in your household.  If your mask becomes wet or soiled, replace with a new one. ? ?Wash your hands often with soap and water for 20 seconds or clean your hands with an alcohol-based hand sanitizer that contains at least 60% alcohol. ? ?Do not share personal items. ? ?Notify your provider: ?if you are in close contact with someone who has COVID  ?or if you develop a fever of 100.4 or greater, sneezing, cough, sore throat, shortness of breath or body aches. ? ?  ?Please read over the following fact sheets that you were given.  ? ?

## 2021-08-23 ENCOUNTER — Encounter (HOSPITAL_COMMUNITY)
Admission: RE | Admit: 2021-08-23 | Discharge: 2021-08-23 | Disposition: A | Discharge status: Home / Self Care | Payer: BC Managed Care – PPO | Source: Ambulatory Visit | Attending: Orthopaedic Surgery | Admitting: Orthopaedic Surgery

## 2021-08-23 ENCOUNTER — Other Ambulatory Visit: Payer: Self-pay

## 2021-08-23 ENCOUNTER — Encounter (HOSPITAL_COMMUNITY): Payer: Self-pay

## 2021-08-23 ENCOUNTER — Telehealth: Payer: Self-pay | Admitting: *Deleted

## 2021-08-23 VITALS — BP 142/68 | HR 70 | Temp 98.2°F | Resp 17 | Ht 66.0 in | Wt 201.0 lb

## 2021-08-23 DIAGNOSIS — I10 Essential (primary) hypertension: Secondary | ICD-10-CM | POA: Insufficient documentation

## 2021-08-23 DIAGNOSIS — Z86718 Personal history of other venous thrombosis and embolism: Secondary | ICD-10-CM | POA: Diagnosis not present

## 2021-08-23 DIAGNOSIS — E7800 Pure hypercholesterolemia, unspecified: Secondary | ICD-10-CM | POA: Insufficient documentation

## 2021-08-23 DIAGNOSIS — K5090 Crohn's disease, unspecified, without complications: Secondary | ICD-10-CM | POA: Diagnosis not present

## 2021-08-23 DIAGNOSIS — E669 Obesity, unspecified: Secondary | ICD-10-CM | POA: Insufficient documentation

## 2021-08-23 DIAGNOSIS — M1611 Unilateral primary osteoarthritis, right hip: Secondary | ICD-10-CM | POA: Diagnosis not present

## 2021-08-23 DIAGNOSIS — Z87891 Personal history of nicotine dependence: Secondary | ICD-10-CM | POA: Diagnosis not present

## 2021-08-23 DIAGNOSIS — Z6832 Body mass index (BMI) 32.0-32.9, adult: Secondary | ICD-10-CM | POA: Diagnosis not present

## 2021-08-23 DIAGNOSIS — Z01818 Encounter for other preprocedural examination: Secondary | ICD-10-CM | POA: Insufficient documentation

## 2021-08-23 HISTORY — DX: Personal history of urinary calculi: Z87.442

## 2021-08-23 HISTORY — DX: Unspecified osteoarthritis, unspecified site: M19.90

## 2021-08-23 HISTORY — DX: Anxiety disorder, unspecified: F41.9

## 2021-08-23 LAB — COMPREHENSIVE METABOLIC PANEL
ALT: 21 U/L (ref 0–44)
AST: 20 U/L (ref 15–41)
Albumin: 3.9 g/dL (ref 3.5–5.0)
Alkaline Phosphatase: 67 U/L (ref 38–126)
Anion gap: 10 (ref 5–15)
BUN: 21 mg/dL (ref 8–23)
CO2: 25 mmol/L (ref 22–32)
Calcium: 9.3 mg/dL (ref 8.9–10.3)
Chloride: 104 mmol/L (ref 98–111)
Creatinine, Ser: 0.73 mg/dL (ref 0.44–1.00)
GFR, Estimated: >60 mL/min (ref >60)
Glucose, Bld: 118 mg/dL — ABNORMAL HIGH (ref 70–99)
Potassium: 3 mmol/L — ABNORMAL LOW (ref 3.5–5.1)
Sodium: 139 mmol/L (ref 135–145)
Total Bilirubin: 2.3 mg/dL — ABNORMAL HIGH (ref 0.3–1.2)
Total Protein: 6.2 g/dL — ABNORMAL LOW (ref 6.5–8.1)

## 2021-08-23 LAB — CBC WITH DIFFERENTIAL/PLATELET
Abs Immature Granulocytes: 0.01 10*3/uL (ref 0.00–0.07)
Basophils Absolute: 0 10*3/uL (ref 0.0–0.1)
Basophils Relative: 1 %
Eosinophils Absolute: 0 10*3/uL (ref 0.0–0.5)
Eosinophils Relative: 1 %
HCT: 39.7 % (ref 36.0–46.0)
Hemoglobin: 12.9 g/dL (ref 12.0–15.0)
Immature Granulocytes: 0 %
Lymphocytes Relative: 22 %
Lymphs Abs: 1.1 10*3/uL (ref 0.7–4.0)
MCH: 29.9 pg (ref 26.0–34.0)
MCHC: 32.5 g/dL (ref 30.0–36.0)
MCV: 92.1 fL (ref 80.0–100.0)
Monocytes Absolute: 0.5 10*3/uL (ref 0.1–1.0)
Monocytes Relative: 10 %
Neutro Abs: 3.1 10*3/uL (ref 1.7–7.7)
Neutrophils Relative %: 66 %
Platelets: 193 10*3/uL (ref 150–400)
RBC: 4.31 MIL/uL (ref 3.87–5.11)
RDW: 12.1 % (ref 11.5–15.5)
WBC: 4.7 10*3/uL (ref 4.0–10.5)
nRBC: 0 % (ref 0.0–0.2)

## 2021-08-23 LAB — TYPE AND SCREEN
ABO/RH(D): A POS
Antibody Screen: NEGATIVE

## 2021-08-23 LAB — SURGICAL PCR SCREEN
MRSA, PCR: NEGATIVE
Staphylococcus aureus: NEGATIVE

## 2021-08-23 NOTE — Progress Notes (Signed)
PCP - Dr. Gala Romney ?Cardiologist - denies ? ?PPM/ICD - n/a ?Device Orders - n/a ?Rep Notified - n/a ? ?Chest x-ray - n/a ?EKG - 08/23/21 ?Stress Test - denies ?ECHO - 10/22/2010 ?Cardiac Cath - denies ? ?Sleep Study - denies ?CPAP - n/a ? ?Fasting Blood Sugar - n/a ?Checks Blood Sugar _____ times a day- n/a ? ?Blood Thinner Instructions: Please follow your surgeon's instructions regarding Xarelto. If you have not received instructions then please contact your surgeon's office for instructions.  ?Patient states surgery was previously scheduled in February and was canceled due to insurance. Patient states she was instructed to stop Xarelto 3 days prior to surgery and bridge with Lovenox. Patient states she has Lovenox at home and will confirm that she is to follow the same instructions again.  ? ? ?Aspirin Instructions: n/a ? ?ERAS Protcol - Yes ?PRE-SURGERY Ensure or G2- Ensure ? ?COVID TEST- Scheduled for Monday 08/28/21 at 1:00 pm. Patient is aware of date, time, and location.  ? ? ?Anesthesia review: Yes ? ?Patient denies shortness of breath, fever, cough and chest pain at PAT appointment ? ? ?All instructions explained to the patient, with a verbal understanding of the material. Patient agrees to go over the instructions while at home for a better understanding. The opportunity to ask questions was provided. ? ? ?

## 2021-08-23 NOTE — Telephone Encounter (Signed)
Received call from pt stating surgery has been pushed back to 08/30/21 and requesting advise on when to stop Xarelto and when to start Lovenox. RN educated pt to take her last dose of Xarelto 08/26/21. RN educated pt to self administer Lovenox BID starting 08/27/21- 08/29/21. RN also educated pt to not take any anticoagulation mediation day of surgery and to resume the day after surgery. Pt verbalized understanding.  ? ? ?

## 2021-08-24 ENCOUNTER — Encounter (HOSPITAL_COMMUNITY): Payer: Self-pay

## 2021-08-24 ENCOUNTER — Other Ambulatory Visit: Payer: Self-pay | Admitting: Physician Assistant

## 2021-08-24 MED ORDER — POTASSIUM CHLORIDE CRYS ER 10 MEQ PO TBCR: 10.0000 meq | EXTENDED_RELEASE_TABLET | Freq: Two times a day (BID) | ORAL | 0 refills | Status: DC

## 2021-08-24 NOTE — Progress Notes (Signed)
Anesthesia Chart Review: ? Case: [id number] Date/Time: 08/30/21 1423  ? Procedure: RIGHT TOTAL HIP ARTHROPLASTY ANTERIOR APPROACH (Right: Hip)  ? Anesthesia type: Spinal  ? Pre-op diagnosis: right hip degenerative joint disease  ? Location: [facility] OR ROOM 06 / [place] [facility]  ? Surgeons: Leandrew Koyanagi, MD  ? ?  ? ? ?DISCUSSION: Patient is a 66 year old female scheduled for the above procedure. Surgery was initially scheduled for 07/31/21 but was delayed until insurance approved. ? ?History includes former smoker (quit 07/27/15), HTN, hypercholesterolemia, DVT (RLE 07/02/16; acute right GSV superficial vein thrombosis 08/04/20 with extension into right CFV, started on Xarelto 08/19/20), colitis (08/2001 admission, colonoscopy findings most consistent with Crohn's disease).  BMI is consistent with obesity. ? ?She had hematology evaluation by Dr. Lindi Adie in 2022 for recurrent DVT. Per 04/14/21 note, hypercoagulable work-up included: ?"Negative blood (factor V Leiden, prothrombin gene mutation, protein C, protein S, Antithrombin III: All normal) ?Antiphospholipid antibody positive by repeat testing 3 months apart. ?  ?My recommendation is indefinite anticoagulation.  We also discussed that Xarelto tends to be less effective than Coumadin in this situation.  However given the convenience and tolerance of Xarelto she wants to remain on it. ?  ?Ultrasound of the right leg 03/21/2021: No evidence of DVT". ? ?Notes in [facility] indicate patient was instructed to take last dose Xarelto 08/26/21 and administer Lovenox 80 mg BID 08/27/21-08/29/21. ? ?She is scheduled for preoperative COVID-19 testing on 08/28/2021.  Anesthesia team to evaluate on the day of surgery. ? ? ?VS: BP (!) 142/68   Pulse 70   Temp 36.8 ?C   Resp 17   Ht '5\' 6"'$  (1.676 m)   Wt 91.2 kg   SpO2 98%   BMI 32.44 kg/m?  ? ? ?PROVIDERS: ?Elwyn Reach, MD is PCP  ?Nicholas Lose, MD is hematologist  ? ? ?LABS: Preoperative labs noted. Isolated elevated total bilirubin at 2.3. Normal AST,  ALT, PLT count. K 3.0, orthopedic PA is supplementing. ?(all labs ordered are listed, but only abnormal results are displayed) ? ?Labs Reviewed  ?COMPREHENSIVE METABOLIC PANEL - Abnormal; Notable for the following components:  ?    Result Value  ? Potassium 3.0 (*)   ? Glucose, Bld 118 (*)   ? Total Protein 6.2 (*)   ? Total Bilirubin 2.3 (*)   ? All other components within normal limits  ?SURGICAL PCR SCREEN  ?CBC WITH DIFFERENTIAL/PLATELET  ?TYPE AND SCREEN  ? ? ? ?IMAGES: ?MRI Right Hip 04/14/21: ?IMPRESSION: ?Moderate right hip osteoarthritis. Degenerative anterior superior ?labral fraying without displaced labral tear. ?  ? ?EKG: 08/23/21: ?Normal sinus rhythm ?Septal infarct , age undetermined ?Abnormal ECG ?When compared with ECG of 22-Jul-2016 00:55, ?No significant change since last tracing ?Confirmed by Gwyndolyn Kaufman [phone]) on 08/23/2021 4:48:41 PM ? ? ?CV: ?LE Venous US 03/21/21: ?Summary:  ?RIGHT:  ?- There is no evidence of deep vein thrombosis in the lower extremity.  ?- There is no evidence of superficial venous thrombosis.  ?- No cystic structure found in the popliteal fossa.  ?   ?LEFT:  ?- No evidence of common femoral vein obstruction.  ?   ? ?Per 09/17/08 Discharge Summary by Dr. Dixie Dials for chest pain: ?"Cardiac catheterization showed minimal coronary artery disease with a ? hyperdynamic left ventricular systolic function." ? ? ?Past Medical History:  ?Diagnosis Date  ? Anxiety   ? Arthritis   ? Colitis 08/2001  ? DVT (deep venous thrombosis) ([place])   ? RLE 06/2016,  08/2020  ? History of kidney stones   ? Hypercholesteremia   ? Hypertension   ? Renal disorder   ? kidney stones  ? ? ?Past Surgical History:  ?Procedure Laterality Date  ? ABDOMINAL HYSTERECTOMY    ? BREAST LUMPECTOMY    ? BUNIONECTOMY    ? C Section    ? ? ?MEDICATIONS: ? potassium chloride (KLOR-CON M) 10 MEQ tablet  ? atorvastatin (LIPITOR) 40 MG tablet  ? citalopram (CELEXA) 20 MG tablet  ? dexamethasone (DECADRON) 4 MG tablet   ? docusate sodium (COLACE) 100 MG capsule  ? enoxaparin (LOVENOX) 80 MG/0.8ML injection  ? hydrochlorothiazide (HYDRODIURIL) 25 MG tablet  ? melatonin 3 MG TABS tablet  ? methocarbamol (ROBAXIN) 500 MG tablet  ? metoprolol succinate (TOPROL-XL) 25 MG 24 hr tablet  ? ondansetron (ZOFRAN) 4 MG tablet  ? oxyCODONE-acetaminophen (PERCOCET) 5-325 MG tablet  ? rivaroxaban (XARELTO) 20 MG TABS tablet  ? valsartan (DIOVAN) 80 MG tablet  ? ?No current facility-administered medications for this encounter.  ?Per medication list, she is not currently taking Decadron, Zofran, or Colace and had not started Robaxin or Percocet. ? ? ?Myra Gianotti, PA-C ?Surgical Short Stay/Anesthesiology ?[facility] Phone [phone] ?[facility] Phone [phone] ?08/24/2021 11:59 AM ? ? ? ? ? ?

## 2021-08-24 NOTE — Anesthesia Preprocedure Evaluation (Signed)
Anesthesia Evaluation    Airway        Dental   Pulmonary former smoker,           Cardiovascular hypertension,      Neuro/Psych    GI/Hepatic   Endo/Other    Renal/GU      Musculoskeletal   Abdominal   Peds  Hematology   Anesthesia Other Findings   Reproductive/Obstetrics                             Anesthesia Physical Anesthesia Plan  ASA:   Anesthesia Plan:    Post-op Pain Management:    Induction:   PONV Risk Score and Plan:   Airway Management Planned:   Additional Equipment:   Intra-op Plan:   Post-operative Plan:   Informed Consent:   Plan Discussed with:   Anesthesia Plan Comments: (PAT note written 08/24/2021 by Myra Gianotti, PA-C. On Xarelto for recurrent RLE DVT. Last Xarelto scheduled for 08/26/21 with Lovenox 80 mg BID 08/27/21-08/29/21. )       Anesthesia Quick Evaluation

## 2021-08-24 NOTE — Progress Notes (Signed)
Please let know patient's potassium is low and that I have called in supplements to start ASAP

## 2021-08-24 NOTE — Progress Notes (Signed)
Patient aware.

## 2021-08-28 ENCOUNTER — Other Ambulatory Visit: Payer: Self-pay | Admitting: Physician Assistant

## 2021-08-28 ENCOUNTER — Other Ambulatory Visit (HOSPITAL_COMMUNITY)
Admission: RE | Admit: 2021-08-28 | Discharge: 2021-08-28 | Disposition: A | Discharge status: Home / Self Care | Payer: BC Managed Care – PPO | Source: Ambulatory Visit | Attending: Orthopaedic Surgery | Admitting: Orthopaedic Surgery

## 2021-08-28 DIAGNOSIS — Z20822 Contact with and (suspected) exposure to covid-19: Secondary | ICD-10-CM | POA: Diagnosis not present

## 2021-08-28 DIAGNOSIS — Z01812 Encounter for preprocedural laboratory examination: Secondary | ICD-10-CM | POA: Insufficient documentation

## 2021-08-28 LAB — SARS CORONAVIRUS 2 (TAT 6-24 HRS): SARS Coronavirus 2: NEGATIVE

## 2021-08-29 MED ORDER — TRANEXAMIC ACID 1000 MG/10ML IV SOLN
2000.0000 mg | INTRAVENOUS | Status: DC
  Filled 2021-08-29: qty 20

## 2021-08-30 ENCOUNTER — Ambulatory Visit (HOSPITAL_COMMUNITY): Payer: BC Managed Care – PPO | Admitting: Anesthesiology

## 2021-08-30 ENCOUNTER — Encounter (HOSPITAL_COMMUNITY): Payer: Self-pay | Admitting: Orthopaedic Surgery

## 2021-08-30 ENCOUNTER — Other Ambulatory Visit: Payer: Self-pay

## 2021-08-30 ENCOUNTER — Observation Stay (HOSPITAL_COMMUNITY): Payer: BC Managed Care – PPO

## 2021-08-30 ENCOUNTER — Observation Stay (HOSPITAL_COMMUNITY)
Admission: RE | Admit: 2021-08-30 | Discharge: 2021-08-31 | Disposition: A | Discharge status: Home Health | Payer: BC Managed Care – PPO | Attending: Orthopaedic Surgery | Admitting: Orthopaedic Surgery

## 2021-08-30 ENCOUNTER — Encounter (HOSPITAL_COMMUNITY)
Admission: RE | Disposition: A | Discharge status: Home Health | Payer: Self-pay | Source: Home / Self Care | Attending: Orthopaedic Surgery

## 2021-08-30 ENCOUNTER — Ambulatory Visit (HOSPITAL_COMMUNITY): Payer: BC Managed Care – PPO | Admitting: Vascular Surgery

## 2021-08-30 ENCOUNTER — Ambulatory Visit (HOSPITAL_COMMUNITY): Payer: BC Managed Care – PPO

## 2021-08-30 DIAGNOSIS — Z419 Encounter for procedure for purposes other than remedying health state, unspecified: Secondary | ICD-10-CM

## 2021-08-30 DIAGNOSIS — I10 Essential (primary) hypertension: Secondary | ICD-10-CM | POA: Insufficient documentation

## 2021-08-30 DIAGNOSIS — Z79899 Other long term (current) drug therapy: Secondary | ICD-10-CM | POA: Diagnosis not present

## 2021-08-30 DIAGNOSIS — Z96641 Presence of right artificial hip joint: Secondary | ICD-10-CM

## 2021-08-30 DIAGNOSIS — Z96649 Presence of unspecified artificial hip joint: Secondary | ICD-10-CM

## 2021-08-30 DIAGNOSIS — Z87891 Personal history of nicotine dependence: Secondary | ICD-10-CM | POA: Diagnosis not present

## 2021-08-30 DIAGNOSIS — I82409 Acute embolism and thrombosis of unspecified deep veins of unspecified lower extremity: Secondary | ICD-10-CM | POA: Diagnosis not present

## 2021-08-30 DIAGNOSIS — M1611 Unilateral primary osteoarthritis, right hip: Principal | ICD-10-CM

## 2021-08-30 DIAGNOSIS — F418 Other specified anxiety disorders: Secondary | ICD-10-CM | POA: Diagnosis not present

## 2021-08-30 DIAGNOSIS — Z01818 Encounter for other preprocedural examination: Secondary | ICD-10-CM

## 2021-08-30 DIAGNOSIS — Z471 Aftercare following joint replacement surgery: Secondary | ICD-10-CM | POA: Diagnosis not present

## 2021-08-30 LAB — ABO/RH: ABO/RH(D): A POS

## 2021-08-30 SURGERY — ARTHROPLASTY, HIP, TOTAL, ANTERIOR APPROACH
Anesthesia: General | Site: Hip | Laterality: Right

## 2021-08-30 MED ORDER — IRBESARTAN 150 MG PO TABS
75.0000 mg | ORAL_TABLET | Freq: Every day | ORAL | Status: DC
Start: 2021-08-30 — End: 2021-08-31
  Administered 2021-08-30: 75 mg via ORAL
  Filled 2021-08-30: qty 1

## 2021-08-30 MED ORDER — CEFAZOLIN SODIUM-DEXTROSE 2-4 GM/100ML-% IV SOLN
2.0000 g | INTRAVENOUS | Status: AC
  Administered 2021-08-30: 2 g via INTRAVENOUS
  Filled 2021-08-30: qty 100

## 2021-08-30 MED ORDER — PROMETHAZINE HCL 25 MG/ML IJ SOLN
6.2500 mg | Freq: Once | INTRAMUSCULAR | Status: AC
  Administered 2021-08-30: 6.25 mg via INTRAVENOUS

## 2021-08-30 MED ORDER — ACETAMINOPHEN 500 MG PO TABS
1000.0000 mg | ORAL_TABLET | Freq: Four times a day (QID) | ORAL | Status: DC
  Administered 2021-08-30 – 2021-08-31 (×3): 1000 mg via ORAL
  Filled 2021-08-30 (×3): qty 2

## 2021-08-30 MED ORDER — ORAL CARE MOUTH RINSE: 15.0000 mL | Freq: Once | OROMUCOSAL | Status: AC

## 2021-08-30 MED ORDER — SODIUM CHLORIDE 0.9 % IV SOLN: INTRAVENOUS | Status: DC

## 2021-08-30 MED ORDER — METOCLOPRAMIDE HCL 5 MG/ML IJ SOLN
5.0000 mg | Freq: Three times a day (TID) | INTRAMUSCULAR | Status: DC | PRN
  Administered 2021-08-30: 10 mg via INTRAVENOUS
  Filled 2021-08-30: qty 2

## 2021-08-30 MED ORDER — HYDROCHLOROTHIAZIDE 25 MG PO TABS
25.0000 mg | ORAL_TABLET | Freq: Every day | ORAL | Status: DC
  Administered 2021-08-30: 25 mg via ORAL
  Filled 2021-08-30: qty 1

## 2021-08-30 MED ORDER — PROMETHAZINE HCL 25 MG/ML IJ SOLN
INTRAMUSCULAR | Status: AC
Start: 2021-08-30 — End: 2021-08-31
  Filled 2021-08-30: qty 1

## 2021-08-30 MED ORDER — IRRISEPT - 450ML BOTTLE WITH 0.05% CHG IN STERILE WATER, USP 99.95% OPTIME
TOPICAL | Status: DC | PRN
  Administered 2021-08-30: 450 mL

## 2021-08-30 MED ORDER — TRANEXAMIC ACID-NACL 1000-0.7 MG/100ML-% IV SOLN
1000.0000 mg | Freq: Once | INTRAVENOUS | Status: AC
  Administered 2021-08-30: 1000 mg via INTRAVENOUS
  Filled 2021-08-30: qty 100

## 2021-08-30 MED ORDER — FENTANYL CITRATE (PF) 100 MCG/2ML IJ SOLN
25.0000 ug | INTRAMUSCULAR | Status: DC | PRN
  Administered 2021-08-30: 25 ug via INTRAVENOUS

## 2021-08-30 MED ORDER — LIDOCAINE 2% (20 MG/ML) 5 ML SYRINGE
INTRAMUSCULAR | Status: DC | PRN
  Administered 2021-08-30: 80 mg via INTRAVENOUS

## 2021-08-30 MED ORDER — DOCUSATE SODIUM 100 MG PO CAPS
100.0000 mg | ORAL_CAPSULE | Freq: Two times a day (BID) | ORAL | Status: DC
  Administered 2021-08-30: 100 mg via ORAL
  Filled 2021-08-30: qty 1

## 2021-08-30 MED ORDER — MIDAZOLAM HCL 2 MG/2ML IJ SOLN
INTRAMUSCULAR | Status: AC
  Filled 2021-08-30: qty 2

## 2021-08-30 MED ORDER — DEXAMETHASONE SODIUM PHOSPHATE 10 MG/ML IJ SOLN
INTRAMUSCULAR | Status: DC | PRN
Start: 2021-08-30 — End: 2021-08-30
  Administered 2021-08-30: 5 mg via INTRAVENOUS

## 2021-08-30 MED ORDER — PANTOPRAZOLE SODIUM 40 MG PO TBEC
40.0000 mg | DELAYED_RELEASE_TABLET | Freq: Every day | ORAL | Status: DC
  Administered 2021-08-30: 40 mg via ORAL
  Filled 2021-08-30: qty 1

## 2021-08-30 MED ORDER — PHENYLEPHRINE HCL-NACL 20-0.9 MG/250ML-% IV SOLN
INTRAVENOUS | Status: DC | PRN
  Administered 2021-08-30: 50 ug/min via INTRAVENOUS

## 2021-08-30 MED ORDER — POVIDONE-IODINE 10 % EX SWAB: 2.0000 "application " | Freq: Once | CUTANEOUS | Status: DC

## 2021-08-30 MED ORDER — 0.9 % SODIUM CHLORIDE (POUR BTL) OPTIME
TOPICAL | Status: DC | PRN
  Administered 2021-08-30: 1000 mL

## 2021-08-30 MED ORDER — POTASSIUM CHLORIDE CRYS ER 10 MEQ PO TBCR
10.0000 meq | EXTENDED_RELEASE_TABLET | Freq: Two times a day (BID) | ORAL | Status: DC
  Administered 2021-08-30: 10 meq via ORAL
  Filled 2021-08-30: qty 1

## 2021-08-30 MED ORDER — PROPOFOL 10 MG/ML IV BOLUS
INTRAVENOUS | Status: AC
  Filled 2021-08-30: qty 20

## 2021-08-30 MED ORDER — OXYCODONE HCL ER 10 MG PO T12A
10.0000 mg | EXTENDED_RELEASE_TABLET | Freq: Two times a day (BID) | ORAL | Status: DC
  Administered 2021-08-30: 10 mg via ORAL
  Filled 2021-08-30: qty 1

## 2021-08-30 MED ORDER — ALUM & MAG HYDROXIDE-SIMETH 200-200-20 MG/5ML PO SUSP: 30.0000 mL | ORAL | Status: DC | PRN

## 2021-08-30 MED ORDER — FENTANYL CITRATE (PF) 250 MCG/5ML IJ SOLN
INTRAMUSCULAR | Status: DC | PRN
Start: 2021-08-30 — End: 2021-08-30
  Administered 2021-08-30: 100 ug via INTRAVENOUS
  Administered 2021-08-30 (×3): 50 ug via INTRAVENOUS

## 2021-08-30 MED ORDER — RIVAROXABAN 20 MG PO TABS: 20.0000 mg | ORAL_TABLET | Freq: Every day | ORAL | Status: DC

## 2021-08-30 MED ORDER — TRANEXAMIC ACID 1000 MG/10ML IV SOLN
INTRAVENOUS | Status: DC | PRN
  Administered 2021-08-30: 2000 mg via TOPICAL

## 2021-08-30 MED ORDER — ACETAMINOPHEN 325 MG PO TABS: 325.0000 mg | ORAL_TABLET | Freq: Four times a day (QID) | ORAL | Status: DC | PRN

## 2021-08-30 MED ORDER — PHENYLEPHRINE 40 MCG/ML (10ML) SYRINGE FOR IV PUSH (FOR BLOOD PRESSURE SUPPORT)
PREFILLED_SYRINGE | INTRAVENOUS | Status: DC | PRN
  Administered 2021-08-30: 200 ug via INTRAVENOUS

## 2021-08-30 MED ORDER — OXYCODONE HCL 5 MG PO TABS
5.0000 mg | ORAL_TABLET | ORAL | Status: DC | PRN
  Administered 2021-08-30: 10 mg via ORAL
  Administered 2021-08-31: 5 mg via ORAL
  Filled 2021-08-30: qty 2
  Filled 2021-08-30: qty 1

## 2021-08-30 MED ORDER — METOCLOPRAMIDE HCL 5 MG PO TABS: 5.0000 mg | ORAL_TABLET | Freq: Three times a day (TID) | ORAL | Status: DC | PRN

## 2021-08-30 MED ORDER — BUPIVACAINE-MELOXICAM ER 400-12 MG/14ML IJ SOLN
INTRAMUSCULAR | Status: DC | PRN
  Administered 2021-08-30: 14 mL

## 2021-08-30 MED ORDER — VANCOMYCIN HCL 1000 MG IV SOLR
INTRAVENOUS | Status: AC
  Filled 2021-08-30: qty 20

## 2021-08-30 MED ORDER — DEXAMETHASONE SODIUM PHOSPHATE 10 MG/ML IJ SOLN
10.0000 mg | Freq: Once | INTRAMUSCULAR | Status: AC
  Administered 2021-08-31: 10 mg via INTRAVENOUS
  Filled 2021-08-30: qty 1

## 2021-08-30 MED ORDER — SODIUM CHLORIDE 0.9 % IR SOLN
Status: DC | PRN
  Administered 2021-08-30: 1000 mL

## 2021-08-30 MED ORDER — METHOCARBAMOL 1000 MG/10ML IJ SOLN
500.0000 mg | Freq: Four times a day (QID) | INTRAVENOUS | Status: DC | PRN
  Filled 2021-08-30: qty 5

## 2021-08-30 MED ORDER — VANCOMYCIN HCL 1 G IV SOLR
INTRAVENOUS | Status: DC | PRN
  Administered 2021-08-30: 1000 mg via TOPICAL

## 2021-08-30 MED ORDER — ONDANSETRON HCL 4 MG/2ML IJ SOLN: 4.0000 mg | Freq: Four times a day (QID) | INTRAMUSCULAR | Status: DC | PRN

## 2021-08-30 MED ORDER — CHLORHEXIDINE GLUCONATE 0.12 % MT SOLN
15.0000 mL | Freq: Once | OROMUCOSAL | Status: AC
  Administered 2021-08-30: 15 mL via OROMUCOSAL
  Filled 2021-08-30: qty 15

## 2021-08-30 MED ORDER — LACTATED RINGERS IV SOLN: INTRAVENOUS | Status: DC

## 2021-08-30 MED ORDER — FENTANYL CITRATE (PF) 100 MCG/2ML IJ SOLN
INTRAMUSCULAR | Status: AC
Start: 2021-08-30 — End: 2021-08-31
  Filled 2021-08-30: qty 2

## 2021-08-30 MED ORDER — PROPOFOL 10 MG/ML IV BOLUS
INTRAVENOUS | Status: DC | PRN
  Administered 2021-08-30: 120 mg via INTRAVENOUS

## 2021-08-30 MED ORDER — DIPHENHYDRAMINE HCL 12.5 MG/5ML PO ELIX
25.0000 mg | ORAL_SOLUTION | ORAL | Status: DC | PRN
  Filled 2021-08-30: qty 10

## 2021-08-30 MED ORDER — ONDANSETRON HCL 4 MG PO TABS: 4.0000 mg | ORAL_TABLET | Freq: Four times a day (QID) | ORAL | Status: DC | PRN

## 2021-08-30 MED ORDER — HYDROMORPHONE HCL 1 MG/ML IJ SOLN
0.5000 mg | INTRAMUSCULAR | Status: DC | PRN
  Administered 2021-08-30: 1 mg via INTRAVENOUS
  Filled 2021-08-30: qty 1

## 2021-08-30 MED ORDER — SORBITOL 70 % SOLN: 30.0000 mL | Freq: Every day | Status: DC | PRN

## 2021-08-30 MED ORDER — METHOCARBAMOL 500 MG PO TABS
500.0000 mg | ORAL_TABLET | Freq: Four times a day (QID) | ORAL | Status: DC | PRN
Start: 2021-08-30 — End: 2021-08-31
  Administered 2021-08-30 – 2021-08-31 (×2): 500 mg via ORAL
  Filled 2021-08-30 (×2): qty 1

## 2021-08-30 MED ORDER — ACETAMINOPHEN 500 MG PO TABS
ORAL_TABLET | ORAL | Status: AC
  Administered 2021-08-30: 1000 mg via ORAL
  Filled 2021-08-30: qty 2

## 2021-08-30 MED ORDER — SUGAMMADEX SODIUM 200 MG/2ML IV SOLN
INTRAVENOUS | Status: DC | PRN
  Administered 2021-08-30: 200 mg via INTRAVENOUS

## 2021-08-30 MED ORDER — MENTHOL 3 MG MT LOZG: 1.0000 | LOZENGE | OROMUCOSAL | Status: DC | PRN

## 2021-08-30 MED ORDER — CEFAZOLIN SODIUM-DEXTROSE 2-4 GM/100ML-% IV SOLN
2.0000 g | Freq: Four times a day (QID) | INTRAVENOUS | Status: AC
  Administered 2021-08-30 (×2): 2 g via INTRAVENOUS
  Filled 2021-08-30 (×2): qty 100

## 2021-08-30 MED ORDER — ONDANSETRON HCL 4 MG/2ML IJ SOLN
INTRAMUSCULAR | Status: DC | PRN
  Administered 2021-08-30: 4 mg via INTRAVENOUS

## 2021-08-30 MED ORDER — OXYCODONE HCL 5 MG PO TABS: 10.0000 mg | ORAL_TABLET | ORAL | Status: DC | PRN

## 2021-08-30 MED ORDER — BUPIVACAINE-MELOXICAM ER 400-12 MG/14ML IJ SOLN
INTRAMUSCULAR | Status: AC
  Filled 2021-08-30: qty 1

## 2021-08-30 MED ORDER — MIDAZOLAM HCL 2 MG/2ML IJ SOLN
INTRAMUSCULAR | Status: DC | PRN
  Administered 2021-08-30: 2 mg via INTRAVENOUS

## 2021-08-30 MED ORDER — CITALOPRAM HYDROBROMIDE 20 MG PO TABS: 20.0000 mg | ORAL_TABLET | Freq: Every day | ORAL | Status: DC

## 2021-08-30 MED ORDER — PHENOL 1.4 % MT LIQD: 1.0000 | OROMUCOSAL | Status: DC | PRN

## 2021-08-30 MED ORDER — ACETAMINOPHEN 500 MG PO TABS: 1000.0000 mg | ORAL_TABLET | Freq: Once | ORAL | Status: AC

## 2021-08-30 MED ORDER — POLYETHYLENE GLYCOL 3350 17 G PO PACK: 17.0000 g | PACK | Freq: Every day | ORAL | Status: DC

## 2021-08-30 MED ORDER — ROCURONIUM BROMIDE 10 MG/ML (PF) SYRINGE
PREFILLED_SYRINGE | INTRAVENOUS | Status: DC | PRN
  Administered 2021-08-30: 100 mg via INTRAVENOUS

## 2021-08-30 MED ORDER — FENTANYL CITRATE (PF) 250 MCG/5ML IJ SOLN
INTRAMUSCULAR | Status: AC
  Filled 2021-08-30: qty 5

## 2021-08-30 MED ORDER — EPHEDRINE SULFATE-NACL 50-0.9 MG/10ML-% IV SOSY
PREFILLED_SYRINGE | INTRAVENOUS | Status: DC | PRN
  Administered 2021-08-30 (×2): 5 mg via INTRAVENOUS
  Administered 2021-08-30: 10 mg via INTRAVENOUS
  Administered 2021-08-30: 15 mg via INTRAVENOUS

## 2021-08-30 MED ORDER — TRANEXAMIC ACID-NACL 1000-0.7 MG/100ML-% IV SOLN
1000.0000 mg | INTRAVENOUS | Status: AC
  Administered 2021-08-30: 1000 mg via INTRAVENOUS
  Filled 2021-08-30: qty 100

## 2021-08-30 SURGICAL SUPPLY — 65 items
ACETAB CUP W/GRIPTION 54 (Plate) ×2 IMPLANT
BAG COUNTER SPONGE SURGICOUNT (BAG) ×2 IMPLANT
BAG DECANTER FOR FLEXI CONT (MISCELLANEOUS) ×2 IMPLANT
BAG SPNG CNTER NS LX DISP (BAG) ×1
CELLS DAT CNTRL 66122 CELL SVR (MISCELLANEOUS) IMPLANT
COVER PERINEAL POST (MISCELLANEOUS) ×2 IMPLANT
COVER SURGICAL LIGHT HANDLE (MISCELLANEOUS) ×2 IMPLANT
CUP ACETAB W/GRIPTION 54 (Plate) IMPLANT
DERMABOND ADVANCED (GAUZE/BANDAGES/DRESSINGS) ×1
DERMABOND ADVANCED .7 DNX12 (GAUZE/BANDAGES/DRESSINGS) IMPLANT
DRAPE C-ARM 42X72 X-RAY (DRAPES) ×2 IMPLANT
DRAPE POUCH INSTRU U-SHP 10X18 (DRAPES) ×2 IMPLANT
DRAPE STERI IOBAN 125X83 (DRAPES) ×2 IMPLANT
DRAPE U-SHAPE 47X51 STRL (DRAPES) ×4 IMPLANT
DRSG AQUACEL AG ADV 3.5X10 (GAUZE/BANDAGES/DRESSINGS) ×2 IMPLANT
DURAPREP 26ML APPLICATOR (WOUND CARE) ×4 IMPLANT
ELECT BLADE 4.0 EZ CLEAN MEGAD (MISCELLANEOUS) ×2
ELECT REM PT RETURN 9FT ADLT (ELECTROSURGICAL) ×2
ELECTRODE BLDE 4.0 EZ CLN MEGD (MISCELLANEOUS) ×1 IMPLANT
ELECTRODE REM PT RTRN 9FT ADLT (ELECTROSURGICAL) ×1 IMPLANT
GLOVE BIOGEL PI IND STRL 7.5 (GLOVE) ×4 IMPLANT
GLOVE BIOGEL PI INDICATOR 7.5 (GLOVE) ×4
GLOVE SURG LTX SZ7 (GLOVE) ×4 IMPLANT
GLOVE SURG UNDER POLY LF SZ7 (GLOVE) ×4 IMPLANT
GLOVE SURG UNDER POLY LF SZ7.5 (GLOVE) ×4 IMPLANT
GOWN STRL REIN XL XLG (GOWN DISPOSABLE) ×2 IMPLANT
GOWN STRL REUS W/ TWL LRG LVL3 (GOWN DISPOSABLE) IMPLANT
GOWN STRL REUS W/ TWL XL LVL3 (GOWN DISPOSABLE) ×1 IMPLANT
GOWN STRL REUS W/TWL LRG LVL3 (GOWN DISPOSABLE)
GOWN STRL REUS W/TWL XL LVL3 (GOWN DISPOSABLE) ×2
HANDPIECE INTERPULSE COAX TIP (DISPOSABLE) ×2
HEAD CERAMIC DELTA 36 PLUS 1.5 (Hips) ×1 IMPLANT
HOOD PEEL AWAY FLYTE STAYCOOL (MISCELLANEOUS) ×4 IMPLANT
IV NS IRRIG 3000ML ARTHROMATIC (IV SOLUTION) ×2 IMPLANT
JET LAVAGE IRRISEPT WOUND (IRRIGATION / IRRIGATOR) ×2
KIT BASIN OR (CUSTOM PROCEDURE TRAY) ×2 IMPLANT
LAVAGE JET IRRISEPT WOUND (IRRIGATION / IRRIGATOR) ×1 IMPLANT
LINER NEUTRAL 54X36MM PLUS 4 (Hips) ×1 IMPLANT
MARKER SKIN DUAL TIP RULER LAB (MISCELLANEOUS) ×2 IMPLANT
NDL SPNL 18GX3.5 QUINCKE PK (NEEDLE) ×1 IMPLANT
NEEDLE SPNL 18GX3.5 QUINCKE PK (NEEDLE) ×2 IMPLANT
PACK TOTAL JOINT (CUSTOM PROCEDURE TRAY) ×2 IMPLANT
PACK UNIVERSAL I (CUSTOM PROCEDURE TRAY) ×2 IMPLANT
PAD COLD SHLDR WRAP-ON (PAD) ×1 IMPLANT
RETRACTOR WND ALEXIS 18 MED (MISCELLANEOUS) IMPLANT
RTRCTR WOUND ALEXIS 18CM MED (MISCELLANEOUS)
SAW OSC TIP CART 19.5X105X1.3 (SAW) ×2 IMPLANT
SCREW 6.5MMX25MM (Screw) ×1 IMPLANT
SET HNDPC FAN SPRY TIP SCT (DISPOSABLE) ×1 IMPLANT
STAPLER VISISTAT 35W (STAPLE) IMPLANT
STEM FEMORAL SZ 5MM STD ACTIS (Stem) ×1 IMPLANT
SUT ETHIBOND 2 V 37 (SUTURE) ×2 IMPLANT
SUT ETHILON 2 0 FS 18 (SUTURE) ×2 IMPLANT
SUT VIC AB 0 CT1 27 (SUTURE) ×2
SUT VIC AB 0 CT1 27XBRD ANBCTR (SUTURE) ×1 IMPLANT
SUT VIC AB 1 CTX 36 (SUTURE) ×2
SUT VIC AB 1 CTX36XBRD ANBCTR (SUTURE) ×1 IMPLANT
SUT VIC AB 2-0 CT1 27 (SUTURE) ×4
SUT VIC AB 2-0 CT1 TAPERPNT 27 (SUTURE) ×2 IMPLANT
SYR 50ML LL SCALE MARK (SYRINGE) ×2 IMPLANT
TOWEL GREEN STERILE (TOWEL DISPOSABLE) ×2 IMPLANT
TRAY CATH 16FR W/PLASTIC CATH (SET/KITS/TRAYS/PACK) IMPLANT
TRAY FOLEY W/BAG SLVR 16FR (SET/KITS/TRAYS/PACK) ×2
TRAY FOLEY W/BAG SLVR 16FR ST (SET/KITS/TRAYS/PACK) ×1 IMPLANT
YANKAUER SUCT BULB TIP NO VENT (SUCTIONS) ×2 IMPLANT

## 2021-08-30 NOTE — Anesthesia Procedure Notes (Signed)
Procedure Name: Intubation ?Date/Time: 08/30/2021 1:58 PM ?Performed by: Janace Litten, CRNA ?Pre-anesthesia Checklist: Patient identified, Emergency Drugs available, Suction available and Patient being monitored ?Patient Re-evaluated:Patient Re-evaluated prior to induction ?Oxygen Delivery Method: Circle System Utilized ?Preoxygenation: Pre-oxygenation with 100% oxygen ?Induction Type: IV induction ?Ventilation: Mask ventilation without difficulty ?Laryngoscope Size: Mac and 3 ?Grade View: Grade I ?Tube type: Oral ?Tube size: 7.0 mm ?Number of attempts: 1 ?Airway Equipment and Method: Stylet and Oral airway ?Placement Confirmation: ETT inserted through vocal cords under direct vision, positive ETCO2 and breath sounds checked- equal and bilateral ?Secured at: 20 cm ?Tube secured with: Tape ?Dental Injury: Teeth and Oropharynx as per pre-operative assessment  ? ? ? ? ?

## 2021-08-30 NOTE — Transfer of Care (Signed)
Immediate Anesthesia Transfer of Care Note ? ?Patient: [name] ? ?Procedure(s) Performed: RIGHT TOTAL HIP ARTHROPLASTY ANTERIOR APPROACH (Right: Hip) ? ?Patient Location: PACU ? ?Anesthesia Type:General ? ?Level of Consciousness: awake, alert  and oriented ? ?Airway & Oxygen Therapy: Patient Spontanous Breathing and Patient connected to nasal cannula oxygen ? ?Post-op Assessment: Report given to RN and Post -op Vital signs reviewed and stable ? ?Post vital signs: Reviewed and stable ? ?Last Vitals:  ?Vitals Value Taken Time  ?BP 167/77 08/30/21 1540  ?Temp    ?Pulse 79 08/30/21 1544  ?Resp 18 08/30/21 1544  ?SpO2 96 % 08/30/21 1544  ?Vitals shown include unvalidated device data. ? ?Last Pain:  ?Vitals:  ? 08/30/21 1241  ?PainSc: 6   ?   ? ?  ? ?Complications: No notable events documented. ?

## 2021-08-30 NOTE — Plan of Care (Signed)
NA

## 2021-08-30 NOTE — H&P (Signed)
? ? ?PREOPERATIVE H&P ? ?Chief Complaint: right hip degenerative joint disease ? ?HPI: ?[name] is a 66 y.o. female who presents for surgical treatment of right hip degenerative joint disease.  She denies any changes in medical history. ? ?Past Medical History:  ?Diagnosis Date  ? Anxiety   ? Arthritis   ? Colitis 08/2001  ? DVT (deep venous thrombosis) ([place])   ? RLE 06/2016, 08/2020  ? History of kidney stones   ? Hypercholesteremia   ? Hypertension   ? Renal disorder   ? kidney stones  ? ?Past Surgical History:  ?Procedure Laterality Date  ? ABDOMINAL HYSTERECTOMY    ? BREAST LUMPECTOMY    ? BUNIONECTOMY    ? C Section    ? ?Social History  ? ?Socioeconomic History  ? Marital status: Widowed  ?  Spouse name: Not on file  ? Number of children: Not on file  ? Years of education: Not on file  ? Highest education level: Not on file  ?Occupational History  ? Not on file  ?Tobacco Use  ? Smoking status: Former  ?  Packs/day: 1.00  ?  Years: 42.00  ?  Pack years: 42.00  ?  Types: Cigarettes  ?  Quit date: 07/27/2015  ?  Years since quitting: 6.0  ? Smokeless tobacco: Never  ?Vaping Use  ? Vaping Use: Never used  ?Substance and Sexual Activity  ? Alcohol use: Yes  ?  Alcohol/week: 0.0 standard drinks  ?  Comment: occasional  ? Drug use: No  ? Sexual activity: Yes  ?  Birth control/protection: Condom  ?Other Topics Concern  ? Not on file  ?Social History Narrative  ? Not on file  ? ?Social Determinants of Health  ? ?Financial Resource Strain: Not on file  ?Food Insecurity: Not on file  ?Transportation Needs: Not on file  ?Physical Activity: Not on file  ?Stress: Not on file  ?Social Connections: Not on file  ? ?Family History  ?Problem Relation Age of Onset  ? Lupus Mother   ? Parkinson's disease Mother   ? Cancer Father   ? Cancer Sister   ? Cancer Brother   ? ?Allergies  ?Allergen Reactions  ? Penicillins Shortness Of Breath, Swelling and Other (See Comments)  ?  Tongue swelling ?  ? Shellfish-Derived Products  Shortness Of Breath and Swelling  ?  tongue swelling ?  ? Morphine And Related Itching and Other (See Comments)  ?  hallucinations   ? ?Prior to Admission medications   ?Medication Sig Start Date End Date Taking? Authorizing Provider  ?atorvastatin (LIPITOR) 40 MG tablet Take 1 tablet (40 mg total) by mouth daily. 08/03/20  Yes Just, Laurita Quint, FNP  ?citalopram (CELEXA) 20 MG tablet Take 1 tablet (20 mg total) by mouth daily. 07/26/20  Yes Just, Laurita Quint, FNP  ?enoxaparin (LOVENOX) 80 MG/0.8ML injection Inject 0.8 mLs (80 mg total) into the skin every 12 (twelve) hours. To begin 07/28/2021 -07/30/2021 06/30/21  Yes Nicholas Lose, MD  ?hydrochlorothiazide (HYDRODIURIL) 25 MG tablet Take 1 tablet (25 mg total) by mouth daily. 07/26/20  Yes Just, Laurita Quint, FNP  ?melatonin 3 MG TABS tablet Take 3 mg by mouth at bedtime as needed (sleep).   Yes [provider]  ?metoprolol succinate (TOPROL-XL) 25 MG 24 hr tablet Take 25 mg by mouth daily. 06/22/20  Yes [provider]  ?potassium chloride (KLOR-CON M) 10 MEQ tablet Take 1 tablet (10 mEq total) by mouth 2 (two)  times daily. Please start ASAP 08/24/21  Yes Aundra Dubin, PA-C  ?rivaroxaban (XARELTO) 20 MG TABS tablet Take 20 mg by mouth at bedtime.   Yes [provider]  ?valsartan (DIOVAN) 80 MG tablet Take 1 tablet (80 mg total) by mouth daily. ?Patient taking differently: Take 80 mg by mouth at bedtime. 07/26/20  Yes Just, Laurita Quint, FNP  ?dexamethasone (DECADRON) 4 MG tablet Take 1 tablet (4 mg total) by mouth 2 (two) times daily with a meal. ?Patient not taking: Reported on 07/24/2021 04/04/21   Nicholas Lose, MD  ?docusate sodium (COLACE) 100 MG capsule Take 1 capsule (100 mg total) by mouth daily as needed. ?Patient not taking: Reported on 08/21/2021 07/25/21 07/25/22  Aundra Dubin, PA-C  ?methocarbamol (ROBAXIN) 500 MG tablet Take 1 tablet (500 mg total) by mouth 2 (two) times daily as needed. To be taken after surgery 07/25/21   Aundra Dubin, PA-C   ?ondansetron (ZOFRAN) 4 MG tablet Take 1 tablet (4 mg total) by mouth every 8 (eight) hours as needed for nausea or vomiting. ?Patient not taking: Reported on 08/21/2021 07/25/21   Aundra Dubin, PA-C  ?oxyCODONE-acetaminophen (PERCOCET) 5-325 MG tablet Take 1-2 tablets by mouth every 6 (six) hours as needed. To be taken after surgery 07/25/21   Aundra Dubin, PA-C  ? ? ? ?Positive ROS: All other systems have been reviewed and were otherwise negative with the exception of those mentioned in the HPI and as above. ? ?Physical Exam: ?General: Alert, no acute distress ?Cardiovascular: No pedal edema ?Respiratory: No cyanosis, no use of accessory musculature ?GI: abdomen soft ?Skin: No lesions in the area of chief complaint ?Neurologic: Sensation intact distally ?Psychiatric: Patient is competent for consent with normal mood and affect ?Lymphatic: no lymphedema ? ?MUSCULOSKELETAL: exam stable ? ?Assessment: ?right hip degenerative joint disease ? ?Plan: ?Plan for Procedure(s): ?RIGHT TOTAL HIP ARTHROPLASTY ANTERIOR APPROACH ? ?The risks benefits and alternatives were discussed with the patient including but not limited to the risks of nonoperative treatment, versus surgical intervention including infection, bleeding, nerve injury,  blood clots, cardiopulmonary complications, morbidity, mortality, among others, and they were willing to proceed.  ? ?Preoperative templating of the joint replacement has been completed, documented, and submitted to the Operating Room personnel in order to optimize intra-operative equipment management. ? ? ?Eduard Roux, MD ?08/30/2021 ?12:55 PM ? ?

## 2021-08-30 NOTE — Discharge Instructions (Signed)

## 2021-08-30 NOTE — Op Note (Signed)
? ?RIGHT TOTAL HIP ARTHROPLASTY ANTERIOR APPROACH  Procedure Note ?[name]   [id number] ? ?Pre-op Diagnosis: right hip degenerative joint disease ?    ?Post-op Diagnosis: same ?  ?Operative Procedures  ?1. Total hip replacement; Right hip; uncemented [id number]  ? ?Surgeon: Frankey Shown, M.D. ? ?Assist: Madalyn Rob, PA-C ?  ?Anesthesia: general ? ?Prosthesis: Depuy ?Acetabulum: Pinnacle 54 mm ?Femur: Actis 5 STD ?Head: 36 mm size: +1.5 ?Liner: +4 ?Bearing Type: ceramic/poly ? ?Total Hip Arthroplasty (Anterior Approach) Op Note:  ?After informed consent was obtained and the operative extremity marked in the holding area, the patient was brought back to the operating room and placed supine on the HANA table. Next, the operative extremity was prepped and draped in normal sterile fashion. Surgical timeout occurred verifying patient identification, surgical site, surgical procedure and administration of antibiotics.  ?A modified anterior Smith-Peterson approach to the hip was performed, using the interval between tensor fascia lata and sartorius.  Dissection was carried bluntly down onto the anterior hip capsule. The lateral femoral circumflex vessels were identified and coagulated. A capsulotomy was performed and the capsular flaps tagged for later repair.  The neck osteotomy was performed. The femoral head was removed which showed severe chondrosis and wear, the acetabular rim was cleared of soft tissue and attention was turned to reaming the acetabulum.  ?Sequential reaming was performed under fluoroscopic guidance. We reamed to a size 53 mm, and then impacted the acetabular shell. A 25 mm cancellous screw was placed through the shell for added fixation.  The liner was then placed after irrigation and attention turned to the femur.  ?After placing the femoral hook, the leg was taken to externally rotated, extended and adducted position taking care to perform soft tissue releases to allow for adequate  mobilization of the femur. Soft tissue was cleared from the shoulder of the greater trochanter and the hook elevator used to improve exposure of the proximal femur. Sequential broaching performed up to a size 5. Trial neck and head were placed. The leg was brought back up to neutral and the construct reduced.  Antibiotic irrigation was placed in the surgical wound and kept for at least 1 minute.  The position and sizing of components, offset and leg lengths were checked using fluoroscopy. Stability of the construct was checked in extension and external rotation without any subluxation or impingement of prosthesis. We dislocated the prosthesis, dropped the leg back into position, removed trial components, and irrigated copiously. The final stem and head was then placed, the leg brought back up, the system reduced and fluoroscopy used to verify positioning.  ?We irrigated, obtained hemostasis and closed the capsule using #2 ethibond suture.  One gram of vancomycin powder was placed in the surgical bed.   One gram of topical tranexamic acid was injected into the joint.  The fascia was closed with #1 vicryl plus, the deep fat layer was closed with 0 vicryl, the subcutaneous layers closed with 2.0 Vicryl Plus and the skin closed with 2.0 nylon and dermabond. A sterile dressing was applied. The patient was awakened in the operating room and taken to recovery in stable condition.  ?All sponge, needle, and instrument counts were correct at the end of the case.  ? ?Tawanna Cooler, my PA, was a medical necessity for opening, closing, limb positioning, retracting, exposing, and overall facilitation and timely completion of the surgery. ? ?Position: supine  ?Complications: see description of procedure.  ?Time Out: performed  ? ?Drains/Packing: none ? ?  Estimated blood loss: see anesthesia record ? ?Returned to Recovery Room: in good condition.  ? ?Antibiotics: yes  ? ?Mechanical VTE (DVT) Prophylaxis: sequential compression  devices, TED thigh-high  ?Chemical VTE (DVT) Prophylaxis: resume xarelto POD 1  ? ?Fluid Replacement: see anesthesia record ? ?Specimens Removed: 1 to pathology  ? ?Sponge and Instrument Count Correct? yes  ? ?PACU: portable radiograph - low AP  ? ?Plan/RTC: Return in 2 weeks for staple removal. ?Weight Bearing/Load Lower Extremity: full  ?Hip precautions: none ?Suture Removal: 2 weeks  ? ?N. Eduard Roux, MD ?[name] ?3:11 PM ? ? ?Implant Name Type Inv. Item Serial No. Manufacturer Lot No. LRB No. Used Action  ?Enzo Montgomery 54MM - [id number] [place] 54MM  [place] [id number] Right 1 Implanted  ?SCREW 6.5MMX25MM - [id number] Screw SCREW 6.5MMX25MM  [employer] [id number] Right 1 Implanted  ?LINER NEUTRAL 54X36MM PLUS 4 - [id number] Hips LINER NEUTRAL 54X36MM PLUS 4  [employer] [id number] Right 1 Implanted  ?STEM FEMORAL SZ 5MM STD ACTIS - [id number] Stem STEM FEMORAL SZ 5MM STD ACTIS  [employer] [id number] Right 1 Implanted  ?HEAD CERAMIC DELTA 36 PLUS 1.5 - [id number] Hips HEAD CERAMIC DELTA 36 PLUS 1.5  [employer] [id number] Right 1 Implanted  ? ? ?

## 2021-08-31 ENCOUNTER — Other Ambulatory Visit: Payer: Self-pay | Admitting: Physician Assistant

## 2021-08-31 ENCOUNTER — Telehealth: Payer: Self-pay | Admitting: Orthopaedic Surgery

## 2021-08-31 ENCOUNTER — Encounter (HOSPITAL_COMMUNITY): Payer: Self-pay | Admitting: Orthopaedic Surgery

## 2021-08-31 DIAGNOSIS — Z87891 Personal history of nicotine dependence: Secondary | ICD-10-CM | POA: Diagnosis not present

## 2021-08-31 DIAGNOSIS — I10 Essential (primary) hypertension: Secondary | ICD-10-CM | POA: Diagnosis not present

## 2021-08-31 DIAGNOSIS — Z79899 Other long term (current) drug therapy: Secondary | ICD-10-CM | POA: Diagnosis not present

## 2021-08-31 DIAGNOSIS — M1611 Unilateral primary osteoarthritis, right hip: Secondary | ICD-10-CM | POA: Diagnosis not present

## 2021-08-31 LAB — CBC
HCT: 34.4 % — ABNORMAL LOW (ref 36.0–46.0)
Hemoglobin: 11.4 g/dL — ABNORMAL LOW (ref 12.0–15.0)
MCH: 30.3 pg (ref 26.0–34.0)
MCHC: 33.1 g/dL (ref 30.0–36.0)
MCV: 91.5 fL (ref 80.0–100.0)
Platelets: 179 10*3/uL (ref 150–400)
RBC: 3.76 MIL/uL — ABNORMAL LOW (ref 3.87–5.11)
RDW: 12.5 % (ref 11.5–15.5)
WBC: 10.3 10*3/uL (ref 4.0–10.5)
nRBC: 0 % (ref 0.0–0.2)

## 2021-08-31 LAB — BASIC METABOLIC PANEL
Anion gap: 12 (ref 5–15)
BUN: 15 mg/dL (ref 8–23)
CO2: 26 mmol/L (ref 22–32)
Calcium: 8.9 mg/dL (ref 8.9–10.3)
Chloride: 101 mmol/L (ref 98–111)
Creatinine, Ser: 0.93 mg/dL (ref 0.44–1.00)
GFR, Estimated: >60 mL/min (ref >60)
Glucose, Bld: 182 mg/dL — ABNORMAL HIGH (ref 70–99)
Potassium: 4.3 mmol/L (ref 3.5–5.1)
Sodium: 139 mmol/L (ref 135–145)

## 2021-08-31 MED ORDER — ONDANSETRON HCL 4 MG PO TABS: 4.0000 mg | ORAL_TABLET | Freq: Three times a day (TID) | ORAL | 0 refills | Status: DC | PRN

## 2021-08-31 MED ORDER — OXYCODONE-ACETAMINOPHEN 5-325 MG PO TABS: 1.0000 | ORAL_TABLET | Freq: Four times a day (QID) | ORAL | 0 refills | Status: DC | PRN

## 2021-08-31 MED ORDER — METHOCARBAMOL 500 MG PO TABS: 500.0000 mg | ORAL_TABLET | Freq: Two times a day (BID) | ORAL | 0 refills | Status: DC | PRN

## 2021-08-31 MED ORDER — DOCUSATE SODIUM 100 MG PO CAPS: 100.0000 mg | ORAL_CAPSULE | Freq: Every day | ORAL | 2 refills | Status: DC | PRN

## 2021-08-31 NOTE — Telephone Encounter (Signed)
Pt calling as a PO surg yesterday and went to pharmacy to pick up pain meds and none are there. Lynann Bologna stated it looks like pt took rx that was called in before surg was moved to new date of yesterday and pt has taken the surg rx prior to surg day instead of post surg that was intended. The best pharmacy is [facility] ([facility] [place] [facility] [address] and the best call back number is [phone].  ?

## 2021-08-31 NOTE — Progress Notes (Signed)
Subjective: ?1 Day Post-Op Procedure(s) (LRB): ?RIGHT TOTAL HIP ARTHROPLASTY ANTERIOR APPROACH (Right) ?Patient reports pain as mild.  Feeling great ? ?Objective: ?Vital signs in last 24 hours: ?Temp:  [97.7 ?F (36.5 ?C)-98.2 ?F (36.8 ?C)] 98.2 ?F (36.8 ?C) (03/16 0734) ?Pulse Rate:  [69-91] 90 (03/16 0734) ?Resp:  [12-18] 18 (03/16 0734) ?BP: (114-184)/(53-88) 114/53 (03/16 0734) ?SpO2:  [93 %-100 %] 97 % (03/16 0734) ?Weight:  [89.8 kg] 89.8 kg (03/15 1155) ? ?Intake/Output from previous day: ?03/15 0701 - 03/16 0700 ?In: 3536 [P.O.:120; I.V.:1000; IV Piggyback:300] ?Out: 200 [Blood:200] ?Intake/Output this shift: ?No intake/output data recorded. ? ?Recent Labs  ?  08/31/21 ?1443  ?HGB 11.4*  ? ?Recent Labs  ?  08/31/21 ?1540  ?WBC 10.3  ?RBC 3.76*  ?HCT 34.4*  ?PLT 179  ? ?Recent Labs  ?  08/31/21 ?0867  ?NA 139  ?K 4.3  ?CL 101  ?CO2 26  ?BUN 15  ?CREATININE 0.93  ?GLUCOSE 182*  ?CALCIUM 8.9  ? ?No results for input(s): LABPT, INR in the last 72 hours. ? ?Neurologically intact ?Neurovascular intact ?Sensation intact distally ?Intact pulses distally ?Dorsiflexion/Plantar flexion intact ?Incision: dressing C/D/I ?No cellulitis present ?Compartment soft ? ? ?Assessment/Plan: ?1 Day Post-Op Procedure(s) (LRB): ?RIGHT TOTAL HIP ARTHROPLASTY ANTERIOR APPROACH (Right) ?Advance diet ?Up with therapy ?D/C IV fluids ?WBAT RLE ?ABLA- mild and stable ?D/c home after first or second PT session depending on progression with mobility ? ? ? ? ? ?Aundra Dubin ?08/31/2021, 8:04 AM ? ?

## 2021-08-31 NOTE — Anesthesia Postprocedure Evaluation (Signed)
Anesthesia Post Note ? ?Patient: [name] ? ?Procedure(s) Performed: RIGHT TOTAL HIP ARTHROPLASTY ANTERIOR APPROACH (Right: Hip) ? ?  ? ?Patient location during evaluation: PACU ?Anesthesia Type: General ?Level of consciousness: awake and alert ?Pain management: pain level controlled ?Vital Signs Assessment: post-procedure vital signs reviewed and stable ?Respiratory status: spontaneous breathing, nonlabored ventilation, respiratory function stable and patient connected to nasal cannula oxygen ?Cardiovascular status: blood pressure returned to baseline and stable ?Postop Assessment: no apparent nausea or vomiting ?Anesthetic complications: no ? ? ?No notable events documented. ? ?Last Vitals:  ?Vitals:  ? 08/31/21 0348 08/31/21 0734  ?BP: 114/61 (!) 114/53  ?Pulse: 87 90  ?Resp: 18 18  ?Temp: 36.7 ?C 36.8 ?C  ?SpO2: 93% 97%  ?  ?Last Pain:  ?Vitals:  ? 08/31/21 0734  ?TempSrc: Oral  ?PainSc:   ? ?Pain Goal: Patients Stated Pain Goal: 3 (08/30/21 2308) ? ?  ?  ?  ?  ?  ?  ?  ? ?[name] ? ? ? ? ?

## 2021-08-31 NOTE — TOC Progression Note (Signed)
Transition of Care (TOC) - Progression Note  ? ? ?Patient Details  ?Name: [name] ?MRN: [id number] ?Date of Birth: 02-23-1956 ? ?Transition of Care (TOC) CM/SW Contact  ?Marilu Favre, RN ?Phone Number: ?08/31/2021, 8:47 AM ? ?Clinical Narrative:    ? ?Spoke to patient at bedside. Discussed home health PT, patient in agreement.  ? ?3[facility] staff will provide any needed DME  ? ? ?[facility] declined referral. ? ?[facility] declined referral due to staffing. ? ?Danae Chen with [facility] accepted referral for HHPT  ? ?Expected Discharge Plan: [place] ?  ? ?Expected Discharge Plan and Services ?Expected Discharge Plan: [place] ?  ?Discharge Planning Services: CM Consult ?Post Acute Care Choice: Home Health ?Living arrangements for the past 2 months: [place] ?Expected Discharge Date: 08/31/21               ?  ?  ?  ?  ?  ?HH Arranged: PT ?  ?  ?  ?  ? ? ?Social Determinants of Health (SDOH) Interventions ?  ? ?Readmission Risk Interventions ?No flowsheet data found. ? ?

## 2021-08-31 NOTE — Evaluation (Signed)
Physical Therapy Evaluation ?Patient Details ?Name: [name] ?MRN: [id number] ?DOB: 10-17-1955 ?Today's Date: 08/31/2021 ? ?History of Present Illness ? 66 y/o female admitted on 08/30/21 following R THA, direct anterior approach. PMH: DVT, HTN  ?Clinical Impression ? Patient admitted following above procedure. Patient presents with post op weakness, impaired ROM, and impaired balance. Patient functioning at supervision level for all mobility utilizing RW. Patient able to negotiate 5 stairs with bilateral handrails to safely access her home. Educated patient on R LE positioning, HEP handout, and importance of mobility in recovery, patient verbalized and demonstrated understanding. Patient eager to return home. Patient will benefit from skilled PT services during acute stay to address listed deficits if patient remains in the acute setting.  ?   ? ?Recommendations for follow up therapy are one component of a multi-disciplinary discharge planning process, led by the attending physician.  Recommendations may be updated based on patient status, additional functional criteria and insurance authorization. ? ?Follow Up Recommendations Follow physician's recommendations for discharge plan and follow up therapies ? ?  ?Assistance Recommended at Discharge Intermittent Supervision/Assistance  ?Patient can return home with the following ?   ? ?  ?Equipment Recommendations None recommended by PT  ?Recommendations for Other Services ?    ?  ?Functional Status Assessment Patient has had a recent decline in their functional status and demonstrates the ability to make significant improvements in function in a reasonable and predictable amount of time.  ? ?  ?Precautions / Restrictions Precautions ?Precautions: Fall ?Restrictions ?Weight Bearing Restrictions: Yes ?RLE Weight Bearing: Weight bearing as tolerated  ? ?  ? ?Mobility ? Bed Mobility ?Overal bed mobility: Needs Assistance ?Bed Mobility: Supine to Sit ?  ?  ?Supine to  sit: Supervision ?  ?  ?General bed mobility comments: increased time and effort. Patient utilizing hands to advance R LE off bed ?  ? ?Transfers ?Overall transfer level: Needs assistance ?Equipment used: Rolling Donnajean Chesnut (2 wheels) ?Transfers: Sit to/from Stand ?Sit to Stand: Supervision ?  ?  ?  ?  ?  ?General transfer comment: supervision for safety. No physical assist required ?  ? ?Ambulation/Gait ?Ambulation/Gait assistance: Supervision ?Gait Distance (Feet): 250 Feet ?Assistive device: Rolling Alexsia Klindt (2 wheels) ?Gait Pattern/deviations: Step-through pattern, Decreased stride length, Decreased stance time - right, Decreased weight shift to right ?Gait velocity: decreased ?  ?  ?General Gait Details: encouraged leading with R LE. Good step through gait pattern but decreased stance time on R due to pain. Supervisoin for safety ? ?Stairs ?Stairs: Yes ?Stairs assistance: Supervision ?Stair Management: Two rails, Step to pattern, Forwards ?Number of Stairs: 5 ?General stair comments: supervision for safety. Educated on up with good and down with bad for safe stair negotiation ? ?Wheelchair Mobility ?  ? ?Modified Rankin (Stroke Patients Only) ?  ? ?  ? ?Balance Overall balance assessment: Needs assistance ?Sitting-balance support: No upper extremity supported, Feet supported ?Sitting balance-Leahy Scale: Normal ?  ?  ?Standing balance support: Bilateral upper extremity supported, Reliant on assistive device for balance ?Standing balance-Leahy Scale: Poor ?Standing balance comment: reliant on UE support of RW ?  ?  ?  ?  ?  ?  ?  ?  ?  ?  ?  ?   ? ? ? ?Pertinent Vitals/Pain Pain Assessment ?Pain Assessment: Faces ?Faces Pain Scale: Hurts little more ?Pain Location: R hip ?Pain Descriptors / Indicators: Grimacing ?Pain Intervention(s): Monitored during session  ? ? ?Home Living Family/patient expects to be discharged to::  Private residence ?Living Arrangements: Alone ?Available Help at Discharge:  Family;Friend(s);Available 24 hours/day ?Type of Home: House ?Home Access: Stairs to enter ?Entrance Stairs-Rails: Can reach both ?Entrance Stairs-Number of Steps: 5 ?  ?Home Layout: One level ?Home Equipment: Conservation officer, nature (2 wheels);Cane - quad;Cane - single point;BSC/3in1;Toilet riser ?   ?  ?Prior Function Prior Level of Function : Independent/Modified Independent;Driving;Working/employed ?  ?  ?  ?  ?  ?  ?Mobility Comments: works at [employer] and ambulated with [facility] ?  ?  ? ? ?Hand Dominance  ?   ? ?  ?Extremity/Trunk Assessment  ? Upper Extremity Assessment ?Upper Extremity Assessment: Overall WFL for tasks assessed ?  ? ?Lower Extremity Assessment ?Lower Extremity Assessment: RLE deficits/detail ?RLE Deficits / Details: post op weakness. Unable to perform SLR ?  ? ?Cervical / Trunk Assessment ?Cervical / Trunk Assessment: Normal  ?Communication  ? Communication: No difficulties  ?Cognition Arousal/Alertness: Awake/alert ?Behavior During Therapy: [facility] for tasks assessed/performed ?Overall Cognitive Status: Within Functional Limits for tasks assessed ?  ?  ?  ?  ?  ?  ?  ?  ?  ?  ?  ?  ?  ?  ?  ?  ?  ?  ?  ? ?  ?General Comments   ? ?  ?Exercises Other Exercises ?Other Exercises: provided surgical hip HEP and instructed patient technique and frequency  ? ?Assessment/Plan  ?  ?PT Assessment Patient needs continued PT services  ?PT Problem List Decreased strength;Decreased range of motion;Decreased activity tolerance;Decreased balance;Decreased mobility ? ?   ?  ?PT Treatment Interventions DME instruction;Gait training;Stair training;Functional mobility training;Therapeutic exercise;Therapeutic activities;Balance training;Patient/family education   ? ?PT Goals (Current goals can be found in the Care Plan section)  ?Acute Rehab PT Goals ?Patient Stated Goal: to go home ?PT Goal Formulation: With patient ?Time For Goal Achievement: 09/14/21 ?Potential to Achieve Goals: Good ? ?  ?Frequency 7X/week ?   ? ? ?Co-evaluation   ?  ?  ?  ?  ? ? ?  ?AM-PAC PT "6 Clicks" Mobility  ?Outcome Measure Help needed turning from your back to your side while in a flat bed without using bedrails?: A Little ?Help needed moving from lying on your back to sitting on the side of a flat bed without using bedrails?: A Little ?Help needed moving to and from a bed to a chair (including a wheelchair)?: A Little ?Help needed standing up from a chair using your arms (e.g., wheelchair or bedside chair)?: A Little ?Help needed to walk in hospital room?: A Little ?Help needed climbing 3-5 steps with a railing? : A Little ?6 Click Score: 18 ? ?  ?End of Session   ?Activity Tolerance: Patient tolerated treatment well ?Patient left: in chair;with call bell/phone within reach;with family/visitor present ?Nurse Communication: Mobility status ?PT Visit Diagnosis: Unsteadiness on feet (R26.81);Muscle weakness (generalized) (M62.81);Difficulty in walking, not elsewhere classified (R26.2) ?  ? ?Time: 4917-9150 ?PT Time Calculation (min) (ACUTE ONLY): 41 min ? ? ?Charges:   PT Evaluation ?$PT Eval Low Complexity: 1 Low ?PT Treatments ?$Gait Training: 8-22 mins ?$Therapeutic Exercise: 8-22 mins ?  ?   ? ? ?Ricci Paff A. Gilford Rile, PT, DPT ?Acute Rehabilitation Services ?Pager [phone] ?Office [phone] ? ? ?Sela Falk A Nehal Shives ?08/31/2021, 9:53 AM ? ?

## 2021-08-31 NOTE — Plan of Care (Signed)
?  Problem: Education: Goal: Knowledge of the prescribed therapeutic regimen will improve Outcome: Completed/Met Goal: Understanding of discharge needs will improve Outcome: Completed/Met Goal: Individualized Educational Video(s) Outcome: Completed/Met   Problem: Activity: Goal: Ability to avoid complications of mobility impairment will improve Outcome: Completed/Met Goal: Ability to tolerate increased activity will improve Outcome: Completed/Met   Problem: Clinical Measurements: Goal: Postoperative complications will be avoided or minimized Outcome: Completed/Met   Problem: Pain Management: Goal: Pain level will decrease with appropriate interventions Outcome: Completed/Met   Problem: Skin Integrity: Goal: Will show signs of wound healing Outcome: Completed/Met   

## 2021-08-31 NOTE — Telephone Encounter (Signed)
Please send in new meds ASAP. Picked up Rx in Feb then looks like it was Cx. Patient had surgery yesterday. ? ?

## 2021-08-31 NOTE — Telephone Encounter (Signed)
Pt is calling back to check on status

## 2021-08-31 NOTE — Telephone Encounter (Signed)
So she picked up meds and took them before she had surgery?  I resent them all

## 2021-08-31 NOTE — Progress Notes (Signed)
Patient awaiting transport via wheelchair by volunteer for discharge home; in no acute distress nor complaints of pain nor discomfort; incision on her right hip with hydrocolloid dressing and is clean, dry and intact; room was checked for her belongings; discharge instructions concerning her medications, incision care; follow up appointment and when to call the doctor as needed were discussed with patient by RN and daughter and both verbalized understanding on the instructions given. ?

## 2021-08-31 NOTE — Discharge Summary (Signed)
Patient ID: ?[name] ?MRN: [id number] ?DOB/AGE: 21-Dec-1955 66 y.o. ? ?Admit date: 08/30/2021 ?Discharge date: 08/31/2021 ? ?Admission Diagnoses:  ?Principal Problem: ?  Primary osteoarthritis of right hip ?Active Problems: ?  Status post total replacement of right hip ? ? ?Discharge Diagnoses:  ?Same ? ?Past Medical History:  ?Diagnosis Date  ? Anxiety   ? Arthritis   ? Colitis 08/2001  ? DVT (deep venous thrombosis) ([place])   ? RLE 06/2016, 08/2020  ? History of kidney stones   ? Hypercholesteremia   ? Hypertension   ? Renal disorder   ? kidney stones  ? ? ?Surgeries: Procedure(s): ?RIGHT TOTAL HIP ARTHROPLASTY ANTERIOR APPROACH on 08/30/2021 ?  ?Consultants:  ? ?Discharged Condition: Improved ? ?Hospital Course: [name] is an 66 y.o. female who was admitted 08/30/2021 for operative treatment ofPrimary osteoarthritis of right hip. Patient has severe unremitting pain that affects sleep, daily activities, and work/hobbies. After pre-op clearance the patient was taken to the operating room on 08/30/2021 and underwent  Procedure(s): ?RIGHT TOTAL HIP ARTHROPLASTY ANTERIOR APPROACH.   ? ?Patient was given perioperative antibiotics:  ?Anti-infectives (From admission, onward)  ? ? Start     Dose/Rate Route Frequency Ordered Stop  ? 08/31/21 0600  ceFAZolin (ANCEF) IVPB 2g/100 mL premix       ? 2 g ?200 mL/hr over 30 Minutes Intravenous On call to O.R. 08/30/21 1223 08/30/21 1403  ? 08/30/21 1700  ceFAZolin (ANCEF) IVPB 2g/100 mL premix       ? 2 g ?200 mL/hr over 30 Minutes Intravenous Every 6 hours 08/30/21 1654 08/31/21 0406  ? 08/30/21 1506  vancomycin (VANCOCIN) powder  Status:  Discontinued       ?   As needed 08/30/21 1510 08/30/21 1536  ? ?  ?  ? ?Patient was given sequential compression devices, early ambulation, and chemoprophylaxis to prevent DVT. ? ?Patient benefited maximally from hospital stay and there were no complications.   ? ?Recent vital signs: Patient Vitals for the past 24 hrs: ? BP Temp Temp  src Pulse Resp SpO2 Height Weight  ?08/31/21 0734 (!) 114/53 98.2 ?F (36.8 ?C) Oral 90 18 97 % -- --  ?08/31/21 0348 114/61 98 ?F (36.7 ?C) Oral 87 18 93 % -- --  ?08/30/21 2257 (!) 125/59 97.8 ?F (36.6 ?C) Oral 91 18 94 % -- --  ?08/30/21 1929 (!) 129/53 98 ?F (36.7 ?C) Oral 83 18 98 % -- --  ?08/30/21 1708 (!) 165/88 97.7 ?F (36.5 ?C) Oral 73 18 100 % -- --  ?08/30/21 1640 (!) 165/85 97.8 ?F (36.6 ?C) -- 76 15 96 % -- --  ?08/30/21 1625 (!) 146/71 -- -- 74 15 95 % -- --  ?08/30/21 1610 (!) 165/74 -- -- 76 17 98 % -- --  ?08/30/21 1555 (!) 163/84 -- -- 76 12 96 % -- --  ?08/30/21 1540 (!) 167/77 97.8 ?F (36.6 ?C) -- 86 17 98 % -- --  ?08/30/21 1155 (!) 184/70 98 ?F (36.7 ?C) -- 69 18 99 % '5\' 6"'$  (1.676 m) 89.8 kg  ?  ? ?Recent laboratory studies:  ?Recent Labs  ?  08/31/21 ?2876  ?WBC 10.3  ?HGB 11.4*  ?HCT 34.4*  ?PLT 179  ?NA 139  ?K 4.3  ?CL 101  ?CO2 26  ?BUN 15  ?CREATININE 0.93  ?GLUCOSE 182*  ?CALCIUM 8.9  ? ? ? ?Discharge Medications:   ?Allergies as of 08/31/2021   ? ?   Reactions  ?  Penicillins Shortness Of Breath, Swelling, Other (See Comments)  ? Tongue swelling  ? Shellfish-derived Products Shortness Of Breath, Swelling  ? tongue swelling  ? Morphine And Related Itching, Other (See Comments)  ? hallucinations   ? ?  ? ?  ?Medication List  ?  ? ?STOP taking these medications   ? ?dexamethasone 4 MG tablet ?Commonly known as: DECADRON ?  ?enoxaparin 80 MG/0.8ML injection ?Commonly known as: LOVENOX ?  ? ?  ? ?TAKE these medications   ? ?atorvastatin 40 MG tablet ?Commonly known as: LIPITOR ?Take 1 tablet (40 mg total) by mouth daily. ?  ?citalopram 20 MG tablet ?Commonly known as: CELEXA ?Take 1 tablet (20 mg total) by mouth daily. ?  ?docusate sodium 100 MG capsule ?Commonly known as: Colace ?Take 1 capsule (100 mg total) by mouth daily as needed. ?  ?hydrochlorothiazide 25 MG tablet ?Commonly known as: HYDRODIURIL ?Take 1 tablet (25 mg total) by mouth daily. ?  ?melatonin 3 MG Tabs tablet ?Take 3 mg by  mouth at bedtime as needed (sleep). ?  ?methocarbamol 500 MG tablet ?Commonly known as: Robaxin ?Take 1 tablet (500 mg total) by mouth 2 (two) times daily as needed. To be taken after surgery ?  ?metoprolol succinate 25 MG 24 hr tablet ?Commonly known as: TOPROL-XL ?Take 25 mg by mouth daily. ?  ?ondansetron 4 MG tablet ?Commonly known as: Zofran ?Take 1 tablet (4 mg total) by mouth every 8 (eight) hours as needed for nausea or vomiting. ?  ?oxyCODONE-acetaminophen 5-325 MG tablet ?Commonly known as: Percocet ?Take 1-2 tablets by mouth every 6 (six) hours as needed. To be taken after surgery ?  ?potassium chloride 10 MEQ tablet ?Commonly known as: KLOR-CON M ?Take 1 tablet (10 mEq total) by mouth 2 (two) times daily. Please start ASAP ?  ?rivaroxaban 20 MG Tabs tablet ?Commonly known as: XARELTO ?Take 20 mg by mouth at bedtime. ?  ?valsartan 80 MG tablet ?Commonly known as: Diovan ?Take 1 tablet (80 mg total) by mouth daily. ?What changed: when to take this ?  ? ?  ? ?  ?  ? ? ?  ?Durable Medical Equipment  ?(From admission, onward)  ?  ? ? ?  ? ?  Start     Ordered  ? 08/30/21 1719  DME Walker rolling  Once       ?Question:  Patient needs a walker to treat with the following condition  Answer:  History of hip replacement  ? 08/30/21 1718  ? 08/30/21 1719  DME 3 n 1  Once       ? 08/30/21 1718  ? 08/30/21 1719  DME Bedside commode  Once       ?Question:  Patient needs a bedside commode to treat with the following condition  Answer:  History of hip replacement  ? 08/30/21 1718  ? ?  ?  ? ?  ? ? ?Diagnostic Studies: DG Pelvis Portable ? ?Result Date: 08/30/2021 ?CLINICAL DATA:  Postop right hip replacement EXAM: PORTABLE PELVIS 1-2 VIEWS COMPARISON:  12/09/2013 FINDINGS: Total hip replacement on the right. Components appear well positioned. No radiographically detectable complication. IMPRESSION: Good appearance following total hip replacement on the right. Electronically Signed   By: Nelson Chimes M.D.   On: 08/30/2021  16:11  ? ?DG C-Arm 1-60 Min-No Report ? ?Result Date: 08/30/2021 ?Fluoroscopy was utilized by the requesting physician.  No radiographic interpretation.  ? ?DG C-Arm 1-60 Min-No Report ? ?Result Date: 08/30/2021 ?Fluoroscopy was  utilized by the requesting physician.  No radiographic interpretation.  ? ?DG HIP UNILAT WITH PELVIS 1V RIGHT ? ?Result Date: 08/30/2021 ?CLINICAL DATA:  Total right hip arthroplasty surgical fluoroscopy. EXAM: DG HIP (WITH OR WITHOUT PELVIS) 1V RIGHT COMPARISON:  Right hip radiographs 04/04/2021 FINDINGS: Images were performed intraoperatively without the presence of a radiologist. Total fluoroscopic images: 4 Total fluoroscopy time: 24 seconds Total dose: 4.529 mGy The patient appears to be undergoing total right hip arthroplasty. Please see intraoperative findings for further detail. IMPRESSION: Fluoroscopy for total right hip arthroplasty. Electronically Signed   By: Yvonne Kendall M.D.   On: 08/30/2021 15:47   ? ?Disposition: Discharge disposition: 01-Home or Self Care ? ? ? ? ? ? ? ? ? Follow-up Information   ? ? Leandrew Koyanagi, MD. Schedule an appointment as soon as possible for a visit in 2 week(s).   ?Specialty: Orthopedic Surgery ?Contact information: ?[address] ?[place] [address] ?[phone] ? ? ?  ?  ? ?  ?  ? ?  ? ? ? ?Signed: ?Aundra Dubin ?08/31/2021, 8:05 AM ? ? ? ? ?

## 2021-09-01 ENCOUNTER — Other Ambulatory Visit: Payer: Self-pay | Admitting: Physician Assistant

## 2021-09-01 ENCOUNTER — Telehealth: Payer: Self-pay

## 2021-09-01 DIAGNOSIS — Z4801 Encounter for change or removal of surgical wound dressing: Secondary | ICD-10-CM | POA: Diagnosis not present

## 2021-09-01 DIAGNOSIS — R269 Unspecified abnormalities of gait and mobility: Secondary | ICD-10-CM | POA: Diagnosis not present

## 2021-09-01 DIAGNOSIS — I10 Essential (primary) hypertension: Secondary | ICD-10-CM | POA: Diagnosis not present

## 2021-09-01 DIAGNOSIS — Z96641 Presence of right artificial hip joint: Secondary | ICD-10-CM | POA: Diagnosis not present

## 2021-09-01 DIAGNOSIS — Z471 Aftercare following joint replacement surgery: Secondary | ICD-10-CM | POA: Diagnosis not present

## 2021-09-01 DIAGNOSIS — M6281 Muscle weakness (generalized): Secondary | ICD-10-CM | POA: Diagnosis not present

## 2021-09-01 DIAGNOSIS — M1611 Unilateral primary osteoarthritis, right hip: Secondary | ICD-10-CM | POA: Diagnosis not present

## 2021-09-01 MED ORDER — OXYCODONE-ACETAMINOPHEN 5-325 MG PO TABS: 1.0000 | ORAL_TABLET | ORAL | 0 refills | Status: DC | PRN

## 2021-09-01 NOTE — Telephone Encounter (Signed)
[facility] called stating that directions for Oxycodone need to be changed due to insurance only paying for 6 a day instead of 8.  Cb# [phone].  Please advise.  Thank you. ?

## 2021-09-01 NOTE — Telephone Encounter (Signed)
Patient aware.

## 2021-09-01 NOTE — Telephone Encounter (Signed)
resent

## 2021-09-03 DIAGNOSIS — M6281 Muscle weakness (generalized): Secondary | ICD-10-CM | POA: Diagnosis not present

## 2021-09-03 DIAGNOSIS — I10 Essential (primary) hypertension: Secondary | ICD-10-CM | POA: Diagnosis not present

## 2021-09-03 DIAGNOSIS — Z471 Aftercare following joint replacement surgery: Secondary | ICD-10-CM | POA: Diagnosis not present

## 2021-09-03 DIAGNOSIS — Z4801 Encounter for change or removal of surgical wound dressing: Secondary | ICD-10-CM | POA: Diagnosis not present

## 2021-09-03 DIAGNOSIS — R269 Unspecified abnormalities of gait and mobility: Secondary | ICD-10-CM | POA: Diagnosis not present

## 2021-09-03 DIAGNOSIS — Z96641 Presence of right artificial hip joint: Secondary | ICD-10-CM | POA: Diagnosis not present

## 2021-09-03 DIAGNOSIS — M1611 Unilateral primary osteoarthritis, right hip: Secondary | ICD-10-CM | POA: Diagnosis not present

## 2021-09-05 DIAGNOSIS — M6281 Muscle weakness (generalized): Secondary | ICD-10-CM | POA: Diagnosis not present

## 2021-09-05 DIAGNOSIS — R269 Unspecified abnormalities of gait and mobility: Secondary | ICD-10-CM | POA: Diagnosis not present

## 2021-09-05 DIAGNOSIS — Z4801 Encounter for change or removal of surgical wound dressing: Secondary | ICD-10-CM | POA: Diagnosis not present

## 2021-09-05 DIAGNOSIS — I10 Essential (primary) hypertension: Secondary | ICD-10-CM | POA: Diagnosis not present

## 2021-09-05 DIAGNOSIS — Z96641 Presence of right artificial hip joint: Secondary | ICD-10-CM | POA: Diagnosis not present

## 2021-09-05 DIAGNOSIS — M1611 Unilateral primary osteoarthritis, right hip: Secondary | ICD-10-CM | POA: Diagnosis not present

## 2021-09-05 DIAGNOSIS — Z471 Aftercare following joint replacement surgery: Secondary | ICD-10-CM | POA: Diagnosis not present

## 2021-09-06 DIAGNOSIS — R269 Unspecified abnormalities of gait and mobility: Secondary | ICD-10-CM | POA: Diagnosis not present

## 2021-09-06 DIAGNOSIS — Z471 Aftercare following joint replacement surgery: Secondary | ICD-10-CM | POA: Diagnosis not present

## 2021-09-06 DIAGNOSIS — M1611 Unilateral primary osteoarthritis, right hip: Secondary | ICD-10-CM | POA: Diagnosis not present

## 2021-09-06 DIAGNOSIS — I10 Essential (primary) hypertension: Secondary | ICD-10-CM | POA: Diagnosis not present

## 2021-09-06 DIAGNOSIS — M6281 Muscle weakness (generalized): Secondary | ICD-10-CM | POA: Diagnosis not present

## 2021-09-06 DIAGNOSIS — Z4801 Encounter for change or removal of surgical wound dressing: Secondary | ICD-10-CM | POA: Diagnosis not present

## 2021-09-06 DIAGNOSIS — Z96641 Presence of right artificial hip joint: Secondary | ICD-10-CM | POA: Diagnosis not present

## 2021-09-11 DIAGNOSIS — M6281 Muscle weakness (generalized): Secondary | ICD-10-CM | POA: Diagnosis not present

## 2021-09-11 DIAGNOSIS — Z96641 Presence of right artificial hip joint: Secondary | ICD-10-CM | POA: Diagnosis not present

## 2021-09-11 DIAGNOSIS — Z4801 Encounter for change or removal of surgical wound dressing: Secondary | ICD-10-CM | POA: Diagnosis not present

## 2021-09-11 DIAGNOSIS — Z471 Aftercare following joint replacement surgery: Secondary | ICD-10-CM | POA: Diagnosis not present

## 2021-09-11 DIAGNOSIS — M1611 Unilateral primary osteoarthritis, right hip: Secondary | ICD-10-CM | POA: Diagnosis not present

## 2021-09-11 DIAGNOSIS — R269 Unspecified abnormalities of gait and mobility: Secondary | ICD-10-CM | POA: Diagnosis not present

## 2021-09-11 DIAGNOSIS — I10 Essential (primary) hypertension: Secondary | ICD-10-CM | POA: Diagnosis not present

## 2021-09-13 ENCOUNTER — Encounter: Payer: Self-pay | Admitting: Orthopaedic Surgery

## 2021-09-13 ENCOUNTER — Ambulatory Visit (INDEPENDENT_AMBULATORY_CARE_PROVIDER_SITE_OTHER): Payer: BC Managed Care – PPO | Admitting: Physician Assistant

## 2021-09-13 ENCOUNTER — Other Ambulatory Visit: Payer: Self-pay

## 2021-09-13 DIAGNOSIS — Z96641 Presence of right artificial hip joint: Secondary | ICD-10-CM

## 2021-09-13 MED ORDER — ACETAMINOPHEN-CODEINE #3 300-30 MG PO TABS: 1.0000 | ORAL_TABLET | Freq: Three times a day (TID) | ORAL | 0 refills | Status: DC | PRN

## 2021-09-13 NOTE — Progress Notes (Signed)
? ?Post-Op Visit Note ?  ?Patient: [name]           ?Date of Birth: 1956/05/16           ?MRN: [id number] ?Visit Date: 09/13/2021 ?PCP: Elwyn Reach, MD ? ? ?Assessment & Plan: ? ?Chief Complaint:  ?Chief Complaint  ?Patient presents with  ? Right Hip - Pain  ? ?Visit Diagnoses:  ?1. History of total right hip replacement   ? ? ?Plan: Patient is a pleasant 66 year old female who comes in today 2 weeks status post right total hip replacement 08/30/2020.  She has been doing well.  She has been getting home health physical therapy and is ambulating with a walker.  She is on chronic Xarelto for history of DVTs.  Examination the right hip reveals a well-healed surgical incision with nylon sutures in place.  No evidence of infection or cellulitis.  Calves are soft nontender.  She is neurovascular intact distally.  Today, sutures were removed and Steri-Strips applied.  Dental prophylaxis reinforced.  Continue with home health physical therapy as well as a home exercise program.  Follow-up with [place] in 4 weeks time for repeat evaluation and AP pelvis x-rays.  Call with concerns or questions. ? ?Follow-Up Instructions: Return in about 4 weeks (around 10/11/2021).  ? ?Orders:  ?No orders of the defined types were placed in this encounter. ? ?No orders of the defined types were placed in this encounter. ? ? ?Imaging: ?No new imaging ? ?PMFS History: ?Patient Active Problem List  ? Diagnosis Date Noted  ? Primary osteoarthritis of right hip 08/30/2021  ? Status post total replacement of right hip 08/30/2021  ? DVT (deep venous thrombosis) ([facility])   ? Acute thromboembolism of deep veins of right lower extremity ([facility]) 07/26/2020  ? Depression 07/15/2007  ? COLLAGENOUS COLITIS 07/15/2007  ? WEIGHT LOSS 07/15/2007  ? HEADACHE 07/15/2007  ? COLONIC POLYPS, BENIGN 02/19/2001  ? ?Past Medical History:  ?Diagnosis Date  ? Anxiety   ? Arthritis   ? Colitis 08/2001  ? DVT (deep venous thrombosis) ([facility])   ? RLE 06/2016, 08/2020  ?  History of kidney stones   ? Hypercholesteremia   ? Hypertension   ? Renal disorder   ? kidney stones  ?  ?Family History  ?Problem Relation Age of Onset  ? Lupus Mother   ? Parkinson's disease Mother   ? Cancer Father   ? Cancer Sister   ? Cancer Brother   ?  ?Past Surgical History:  ?Procedure Laterality Date  ? ABDOMINAL HYSTERECTOMY    ? BREAST LUMPECTOMY    ? BUNIONECTOMY    ? C Section    ? TOTAL HIP ARTHROPLASTY Right 08/30/2021  ? Procedure: RIGHT TOTAL HIP ARTHROPLASTY ANTERIOR APPROACH;  Surgeon: Leandrew Koyanagi, MD;  Location: [facility];  Service: Orthopedics;  Laterality: Right;  ? ?Social History  ? ?Occupational History  ? Not on file  ?Tobacco Use  ? Smoking status: Former  ?  Packs/day: 1.00  ?  Years: 42.00  ?  Pack years: 42.00  ?  Types: Cigarettes  ?  Quit date: 07/27/2015  ?  Years since quitting: 6.1  ? Smokeless tobacco: Never  ?Vaping Use  ? Vaping Use: Never used  ?Substance and Sexual Activity  ? Alcohol use: Yes  ?  Alcohol/week: 0.0 standard drinks  ?  Comment: occasional  ? Drug use: No  ? Sexual activity: Yes  ?  Birth control/protection: Condom  ? ? ? ?

## 2021-09-14 DIAGNOSIS — R269 Unspecified abnormalities of gait and mobility: Secondary | ICD-10-CM | POA: Diagnosis not present

## 2021-09-14 DIAGNOSIS — Z4801 Encounter for change or removal of surgical wound dressing: Secondary | ICD-10-CM | POA: Diagnosis not present

## 2021-09-14 DIAGNOSIS — Z96641 Presence of right artificial hip joint: Secondary | ICD-10-CM | POA: Diagnosis not present

## 2021-09-14 DIAGNOSIS — Z471 Aftercare following joint replacement surgery: Secondary | ICD-10-CM | POA: Diagnosis not present

## 2021-09-14 DIAGNOSIS — I10 Essential (primary) hypertension: Secondary | ICD-10-CM | POA: Diagnosis not present

## 2021-09-14 DIAGNOSIS — M1611 Unilateral primary osteoarthritis, right hip: Secondary | ICD-10-CM | POA: Diagnosis not present

## 2021-09-14 DIAGNOSIS — M6281 Muscle weakness (generalized): Secondary | ICD-10-CM | POA: Diagnosis not present

## 2021-09-15 DIAGNOSIS — M6281 Muscle weakness (generalized): Secondary | ICD-10-CM | POA: Diagnosis not present

## 2021-09-15 DIAGNOSIS — Z96641 Presence of right artificial hip joint: Secondary | ICD-10-CM | POA: Diagnosis not present

## 2021-09-15 DIAGNOSIS — R269 Unspecified abnormalities of gait and mobility: Secondary | ICD-10-CM | POA: Diagnosis not present

## 2021-09-15 DIAGNOSIS — Z4801 Encounter for change or removal of surgical wound dressing: Secondary | ICD-10-CM | POA: Diagnosis not present

## 2021-09-15 DIAGNOSIS — I10 Essential (primary) hypertension: Secondary | ICD-10-CM | POA: Diagnosis not present

## 2021-09-15 DIAGNOSIS — M1611 Unilateral primary osteoarthritis, right hip: Secondary | ICD-10-CM | POA: Diagnosis not present

## 2021-09-15 DIAGNOSIS — Z471 Aftercare following joint replacement surgery: Secondary | ICD-10-CM | POA: Diagnosis not present

## 2021-09-16 DIAGNOSIS — M6281 Muscle weakness (generalized): Secondary | ICD-10-CM | POA: Diagnosis not present

## 2021-09-16 DIAGNOSIS — I10 Essential (primary) hypertension: Secondary | ICD-10-CM | POA: Diagnosis not present

## 2021-09-16 DIAGNOSIS — Z471 Aftercare following joint replacement surgery: Secondary | ICD-10-CM | POA: Diagnosis not present

## 2021-09-16 DIAGNOSIS — Z96641 Presence of right artificial hip joint: Secondary | ICD-10-CM | POA: Diagnosis not present

## 2021-09-16 DIAGNOSIS — Z4801 Encounter for change or removal of surgical wound dressing: Secondary | ICD-10-CM | POA: Diagnosis not present

## 2021-09-16 DIAGNOSIS — R269 Unspecified abnormalities of gait and mobility: Secondary | ICD-10-CM | POA: Diagnosis not present

## 2021-09-16 DIAGNOSIS — M1611 Unilateral primary osteoarthritis, right hip: Secondary | ICD-10-CM | POA: Diagnosis not present

## 2021-09-21 ENCOUNTER — Other Ambulatory Visit: Payer: Self-pay | Admitting: Physician Assistant

## 2021-09-21 DIAGNOSIS — I10 Essential (primary) hypertension: Secondary | ICD-10-CM | POA: Diagnosis not present

## 2021-09-21 DIAGNOSIS — M6281 Muscle weakness (generalized): Secondary | ICD-10-CM | POA: Diagnosis not present

## 2021-09-21 DIAGNOSIS — Z471 Aftercare following joint replacement surgery: Secondary | ICD-10-CM | POA: Diagnosis not present

## 2021-09-21 DIAGNOSIS — R269 Unspecified abnormalities of gait and mobility: Secondary | ICD-10-CM | POA: Diagnosis not present

## 2021-09-21 DIAGNOSIS — Z96641 Presence of right artificial hip joint: Secondary | ICD-10-CM | POA: Diagnosis not present

## 2021-09-21 DIAGNOSIS — M1611 Unilateral primary osteoarthritis, right hip: Secondary | ICD-10-CM | POA: Diagnosis not present

## 2021-09-21 DIAGNOSIS — Z4801 Encounter for change or removal of surgical wound dressing: Secondary | ICD-10-CM | POA: Diagnosis not present

## 2021-09-25 DIAGNOSIS — I10 Essential (primary) hypertension: Secondary | ICD-10-CM | POA: Diagnosis not present

## 2021-09-25 DIAGNOSIS — R269 Unspecified abnormalities of gait and mobility: Secondary | ICD-10-CM | POA: Diagnosis not present

## 2021-09-25 DIAGNOSIS — M1611 Unilateral primary osteoarthritis, right hip: Secondary | ICD-10-CM | POA: Diagnosis not present

## 2021-09-25 DIAGNOSIS — M6281 Muscle weakness (generalized): Secondary | ICD-10-CM | POA: Diagnosis not present

## 2021-09-25 DIAGNOSIS — Z4801 Encounter for change or removal of surgical wound dressing: Secondary | ICD-10-CM | POA: Diagnosis not present

## 2021-09-25 DIAGNOSIS — Z471 Aftercare following joint replacement surgery: Secondary | ICD-10-CM | POA: Diagnosis not present

## 2021-09-25 DIAGNOSIS — Z96641 Presence of right artificial hip joint: Secondary | ICD-10-CM | POA: Diagnosis not present

## 2021-09-26 ENCOUNTER — Telehealth: Payer: Self-pay | Admitting: Physician Assistant

## 2021-09-26 NOTE — Telephone Encounter (Signed)
[name] (PT) from [facility] called requesting to extend home health pt for 2wk 2. Pt still is unable to walk outside with walker. Please call [name] or send extended verbal orders. [name] phone number is [phone] ?

## 2021-09-26 NOTE — Telephone Encounter (Signed)
Called to approve orders 

## 2021-09-27 DIAGNOSIS — Z471 Aftercare following joint replacement surgery: Secondary | ICD-10-CM | POA: Diagnosis not present

## 2021-09-27 DIAGNOSIS — M6281 Muscle weakness (generalized): Secondary | ICD-10-CM | POA: Diagnosis not present

## 2021-09-27 DIAGNOSIS — R269 Unspecified abnormalities of gait and mobility: Secondary | ICD-10-CM | POA: Diagnosis not present

## 2021-09-27 DIAGNOSIS — Z4801 Encounter for change or removal of surgical wound dressing: Secondary | ICD-10-CM | POA: Diagnosis not present

## 2021-09-27 DIAGNOSIS — Z96641 Presence of right artificial hip joint: Secondary | ICD-10-CM | POA: Diagnosis not present

## 2021-09-27 DIAGNOSIS — I10 Essential (primary) hypertension: Secondary | ICD-10-CM | POA: Diagnosis not present

## 2021-09-27 DIAGNOSIS — M1611 Unilateral primary osteoarthritis, right hip: Secondary | ICD-10-CM | POA: Diagnosis not present

## 2021-10-02 DIAGNOSIS — M6281 Muscle weakness (generalized): Secondary | ICD-10-CM | POA: Diagnosis not present

## 2021-10-02 DIAGNOSIS — Z96641 Presence of right artificial hip joint: Secondary | ICD-10-CM | POA: Diagnosis not present

## 2021-10-02 DIAGNOSIS — I10 Essential (primary) hypertension: Secondary | ICD-10-CM | POA: Diagnosis not present

## 2021-10-02 DIAGNOSIS — R269 Unspecified abnormalities of gait and mobility: Secondary | ICD-10-CM | POA: Diagnosis not present

## 2021-10-02 DIAGNOSIS — M1611 Unilateral primary osteoarthritis, right hip: Secondary | ICD-10-CM | POA: Diagnosis not present

## 2021-10-02 DIAGNOSIS — Z4801 Encounter for change or removal of surgical wound dressing: Secondary | ICD-10-CM | POA: Diagnosis not present

## 2021-10-02 DIAGNOSIS — Z471 Aftercare following joint replacement surgery: Secondary | ICD-10-CM | POA: Diagnosis not present

## 2021-10-04 DIAGNOSIS — M6281 Muscle weakness (generalized): Secondary | ICD-10-CM | POA: Diagnosis not present

## 2021-10-04 DIAGNOSIS — Z4801 Encounter for change or removal of surgical wound dressing: Secondary | ICD-10-CM | POA: Diagnosis not present

## 2021-10-04 DIAGNOSIS — Z96641 Presence of right artificial hip joint: Secondary | ICD-10-CM | POA: Diagnosis not present

## 2021-10-04 DIAGNOSIS — R269 Unspecified abnormalities of gait and mobility: Secondary | ICD-10-CM | POA: Diagnosis not present

## 2021-10-04 DIAGNOSIS — I10 Essential (primary) hypertension: Secondary | ICD-10-CM | POA: Diagnosis not present

## 2021-10-04 DIAGNOSIS — Z471 Aftercare following joint replacement surgery: Secondary | ICD-10-CM | POA: Diagnosis not present

## 2021-10-04 DIAGNOSIS — M1611 Unilateral primary osteoarthritis, right hip: Secondary | ICD-10-CM | POA: Diagnosis not present

## 2021-10-06 ENCOUNTER — Ambulatory Visit (INDEPENDENT_AMBULATORY_CARE_PROVIDER_SITE_OTHER): Payer: BC Managed Care – PPO | Admitting: Orthopaedic Surgery

## 2021-10-06 ENCOUNTER — Encounter: Payer: Self-pay | Admitting: Orthopaedic Surgery

## 2021-10-06 ENCOUNTER — Ambulatory Visit (INDEPENDENT_AMBULATORY_CARE_PROVIDER_SITE_OTHER): Payer: BC Managed Care – PPO

## 2021-10-06 DIAGNOSIS — Z96641 Presence of right artificial hip joint: Secondary | ICD-10-CM

## 2021-10-06 NOTE — Progress Notes (Signed)
? ?Post-Op Visit Note ?  ?Patient: [name]           ?Date of Birth: 1956/03/18           ?MRN: [id number] ?Visit Date: 10/06/2021 ?PCP: Elwyn Reach, MD ? ? ?Assessment & Plan: ? ?Chief Complaint:  ?Chief Complaint  ?Patient presents with  ? Right Hip - Pain, Follow-up  ? ?Visit Diagnoses:  ?1. History of total right hip replacement   ? ? ?Plan: [name] is 5 weeks and 2 days status post right total hip replacement.  She has some issues with lack of stamina and strength to her hip but no pain.  She has completed home health PT.  Overall doing well.  Ambulates with a cane. ? ?Examination of right hip shows a healed surgical incision.  Good functional range of motion of the hip. ? ?[name] is doing well from the hip replacement.  She is about 5 weeks now.  She still lacks enough stamina to tolerate a 8-hour workday.  She will continue to work on getting this better.  She would like to reassess in 3 weeks to see if she can return at that time.  Dental prophylaxis reinforced.  Questions encouraged and answered. ? ?Follow-Up Instructions: Return in about 3 weeks (around 10/27/2021).  ? ?Orders:  ?Orders Placed This Encounter  ?Procedures  ? XR Pelvis 1-2 Views  ? ?No orders of the defined types were placed in this encounter. ? ? ?Imaging: ?XR Pelvis 1-2 Views ? ?Result Date: 10/06/2021 ?Stable total hip replacement without complications  ? ?PMFS History: ?Patient Active Problem List  ? Diagnosis Date Noted  ? Primary osteoarthritis of right hip 08/30/2021  ? Status post total replacement of right hip 08/30/2021  ? DVT (deep venous thrombosis) ([facility])   ? Acute thromboembolism of deep veins of right lower extremity ([facility]) 07/26/2020  ? Depression 07/15/2007  ? COLLAGENOUS COLITIS 07/15/2007  ? WEIGHT LOSS 07/15/2007  ? HEADACHE 07/15/2007  ? COLONIC POLYPS, BENIGN 02/19/2001  ? ?Past Medical History:  ?Diagnosis Date  ? Anxiety   ? Arthritis   ? Colitis 08/2001  ? DVT (deep venous thrombosis) ([facility])   ? RLE 06/2016,  08/2020  ? History of kidney stones   ? Hypercholesteremia   ? Hypertension   ? Renal disorder   ? kidney stones  ?  ?Family History  ?Problem Relation Age of Onset  ? Lupus Mother   ? Parkinson's disease Mother   ? Cancer Father   ? Cancer Sister   ? Cancer Brother   ?  ?Past Surgical History:  ?Procedure Laterality Date  ? ABDOMINAL HYSTERECTOMY    ? BREAST LUMPECTOMY    ? BUNIONECTOMY    ? C Section    ? TOTAL HIP ARTHROPLASTY Right 08/30/2021  ? Procedure: RIGHT TOTAL HIP ARTHROPLASTY ANTERIOR APPROACH;  Surgeon: Leandrew Koyanagi, MD;  Location: [facility];  Service: Orthopedics;  Laterality: Right;  ? ?Social History  ? ?Occupational History  ? Not on file  ?Tobacco Use  ? Smoking status: Former  ?  Packs/day: 1.00  ?  Years: 42.00  ?  Pack years: 42.00  ?  Types: Cigarettes  ?  Quit date: 07/27/2015  ?  Years since quitting: 6.2  ? Smokeless tobacco: Never  ?Vaping Use  ? Vaping Use: Never used  ?Substance and Sexual Activity  ? Alcohol use: Yes  ?  Alcohol/week: 0.0 standard drinks  ?  Comment: occasional  ? Drug use: No  ?  Sexual activity: Yes  ?  Birth control/protection: Condom  ? ? ? ?

## 2021-10-23 ENCOUNTER — Telehealth: Payer: Self-pay | Admitting: Orthopaedic Surgery

## 2021-10-23 NOTE — Telephone Encounter (Signed)
Received call from pt requesting last ov note to be faxed to [facility] at [phone]. Attn: Little Ishikawa, ov note faxed ?

## 2021-10-27 ENCOUNTER — Ambulatory Visit (INDEPENDENT_AMBULATORY_CARE_PROVIDER_SITE_OTHER): Payer: BC Managed Care – PPO | Admitting: Orthopaedic Surgery

## 2021-10-27 ENCOUNTER — Encounter: Payer: Self-pay | Admitting: Orthopaedic Surgery

## 2021-10-27 DIAGNOSIS — Z96641 Presence of right artificial hip joint: Secondary | ICD-10-CM

## 2021-10-27 NOTE — Progress Notes (Signed)
? ?  Post-Op Visit Note ?  ?Patient: [name]           ?Date of Birth: [birth year]           ?MRN: [id number] ?Visit Date: 10/27/2021 ?PCP: Elwyn Reach, MD ? ? ?Assessment & Plan: ? ?Chief Complaint:  ?Chief Complaint  ?Patient presents with  ? Right Hip - Pain, Follow-up, Routine Post Op  ? ?Visit Diagnoses:  ?1. Status post total replacement of right hip   ? ? ?Plan: [name] is 8 weeks status post right total hip replacement.  She is doing well and ready to go back to work.  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  This will not be physically demanding. ? ?Examination of right hip is unchanged. ? ?From my standpoint we will release [name] to go back to work on Monday at the customer service test without any restrictions.  Dental prophylaxis reinforced.  Recheck in 2 months with standing AP pelvis x-rays. ? ?Follow-Up Instructions: Return in about 2 months (around 12/27/2021).  ? ?Orders:  ?No orders of the defined types were placed in this encounter. ? ?No orders of the defined types were placed in this encounter. ? ? ?Imaging: ?No results found. ? ?PMFS History: ?Patient Active Problem List  ? Diagnosis Date Noted  ? Primary osteoarthritis of right hip 08/30/2021  ? Status post total replacement of right hip 08/30/2021  ? DVT (deep venous thrombosis) ([facility])   ? Acute thromboembolism of deep veins of right lower extremity ([facility]) 07/26/2020  ? Depression 07/15/2007  ? COLLAGENOUS COLITIS 07/15/2007  ? WEIGHT LOSS 07/15/2007  ? HEADACHE 07/15/2007  ? COLONIC POLYPS, BENIGN 02/19/2001  ? ?Past Medical History:  ?Diagnosis Date  ? Anxiety   ? Arthritis   ? Colitis 08/2001  ? DVT (deep venous thrombosis) ([facility])   ? RLE 06/2016, 08/2020  ? History of kidney stones   ? Hypercholesteremia   ? Hypertension   ? Renal disorder   ? kidney stones  ?  ?Family History  ?Problem Relation Age of Onset  ? Lupus Mother   ? Parkinson's disease Mother   ? Cancer Father   ? Cancer Sister   ? Cancer  Brother   ?  ?Past Surgical History:  ?Procedure Laterality Date  ? ABDOMINAL HYSTERECTOMY    ? BREAST LUMPECTOMY    ? BUNIONECTOMY    ? C Section    ? TOTAL HIP ARTHROPLASTY Right 08/30/2021  ? Procedure: RIGHT TOTAL HIP ARTHROPLASTY ANTERIOR APPROACH;  Surgeon: Leandrew Koyanagi, MD;  Location: [facility];  Service: Orthopedics;  Laterality: Right;  ? ?Social History  ? ?Occupational History  ? Not on file  ?Tobacco Use  ? Smoking status: Former  ?  Packs/day: 1.00  ?  Years: 42.00  ?  Pack years: 42.00  ?  Types: Cigarettes  ?  Quit date: 07/27/2015  ?  Years since quitting: 6.2  ? Smokeless tobacco: Never  ?Vaping Use  ? Vaping Use: Never used  ?Substance and Sexual Activity  ? Alcohol use: Yes  ?  Alcohol/week: 0.0 standard drinks  ?  Comment: occasional  ? Drug use: No  ? Sexual activity: Yes  ?  Birth control/protection: Condom  ? ? ? ?

## 2021-12-26 ENCOUNTER — Ambulatory Visit (INDEPENDENT_AMBULATORY_CARE_PROVIDER_SITE_OTHER): Payer: BC Managed Care – PPO

## 2021-12-26 ENCOUNTER — Ambulatory Visit (INDEPENDENT_AMBULATORY_CARE_PROVIDER_SITE_OTHER): Payer: BC Managed Care – PPO | Admitting: Orthopaedic Surgery

## 2021-12-26 DIAGNOSIS — Z96641 Presence of right artificial hip joint: Secondary | ICD-10-CM

## 2021-12-26 MED ORDER — DOXYCYCLINE HYCLATE 100 MG PO TABS: 200.0000 mg | ORAL_TABLET | Freq: Once | ORAL | 6 refills | Status: AC

## 2021-12-26 NOTE — Progress Notes (Signed)
Post-Op Visit Note   Patient: [name]           Date of Birth: 1956/01/31           MRN: [id number] Visit Date: 12/26/2021 PCP: Elwyn Reach, MD   Assessment & Plan:  Chief Complaint:  Chief Complaint  Patient presents with   Right Hip - Routine Post Op   Visit Diagnoses:  1. Status post total replacement of right hip     Plan: Deandria is 48-monthstatus post right total hip on 08/30/2021.  Doing well overall.  Feels some discomfort when flexing her hip but otherwise everything is going great.  Has returned back to work.  No complaints.  Examination right hip shows a fully healed surgical scar.  Normal gait and ambulation.  No pain with passive range of motion.  Implant is stable on x-rays.  Overall DTierrais doing very well.  She will continue to work on strengthening of her right hip.  She is very happy with her outcome.  Doxycycline sent in for her upcoming dentist appointment.  Recheck in 6 months with standing AP pelvis x-rays.  Follow-Up Instructions: Return in about 6 months (around 06/28/2022).   Orders:  Orders Placed This Encounter  Procedures   XR Pelvis 1-2 Views   Meds ordered this encounter  Medications   doxycycline (VIBRA-TABS) 100 MG tablet    Sig: Take 2 tablets (200 mg total) by mouth once for 1 dose.    Dispense:  2 tablet    Refill:  6    Imaging: XR Pelvis 1-2 Views  Result Date: 12/26/2021 Stable total hip replacement without complications   PMFS History: Patient Active Problem List   Diagnosis Date Noted   Primary osteoarthritis of right hip 08/30/2021   Status post total replacement of right hip 08/30/2021   DVT (deep venous thrombosis) ([facility])    [place] 07/26/[place]   Depression 07/15/2007   COLLAGENOUS COLITIS 07/15/2007   WEIGHT LOSS 07/15/2007   HEADACHE 07/15/2007   COLONIC POLYPS, BENIGN 02/19/2001   Past Medical History:  Diagnosis Date   Anxiety     Arthritis    Colitis 08/2001   DVT (deep venous thrombosis) ([facility]    RLE 06/2016, 08/2020   History of kidney stones    Hypercholesteremia    Hypertension    Renal disorder    kidney stones    Family History  Problem Relation Age of Onset   Lupus Mother    Parkinson's disease Mother    Cancer Father    Cancer Sister    Cancer Brother     Past Surgical History:  Procedure Laterality Date   ABDOMINAL HYSTERECTOMY     BREAST LUMPECTOMY     BUNIONECTOMY     C Section     TOTAL HIP ARTHROPLASTY Right 08/30/2021   Procedure: RIGHT TOTAL HIP ARTHROPLASTY ANTERIOR APPROACH;  Surgeon: XLeandrew Koyanagi MD;  Location: [facility]  Service: Orthopedics;  Laterality: Right;   Social History   Occupational History   Not on file  Tobacco Use   Smoking status: Former    Packs/day: 1.00    Years: 42.00    Total pack years: 42.00    Types: Cigarettes    Quit date: 07/27/2015    Years since quitting: 6.4   Smokeless tobacco: Never  Vaping Use   Vaping Use: Never used  Substance and Sexual Activity   Alcohol use:  Yes    Alcohol/week: 0.0 standard drinks of alcohol    Comment: occasional   Drug use: No   Sexual activity: Yes    Birth control/protection: Condom

## 2022-02-16 ENCOUNTER — Other Ambulatory Visit: Payer: Self-pay | Admitting: Hematology and Oncology

## 2022-02-26 ENCOUNTER — Telehealth: Payer: Self-pay | Admitting: Hematology and Oncology

## 2022-02-26 NOTE — Telephone Encounter (Signed)
Scheduled appointment per 9/1 staff message. Patient is aware.

## 2022-04-16 NOTE — Progress Notes (Signed)
Patient Care Team: Elwyn Reach, MD as PCP - General (Internal Medicine)  DIAGNOSIS: No diagnosis found.  SUMMARY OF ONCOLOGIC HISTORY: Oncology History   No history exists.    CHIEF COMPLIANT: Follow-up right lower leg DVT.  INTERVAL HISTORY: [name] is a  66 y.o. female with above-mentioned history of right lower leg DVT currently on Xarelto. She presents to clinic for a follow-up.   ALLERGIES:  is allergic to penicillins, shellfish-derived products, and morphine and related.  MEDICATIONS:  Current Outpatient Medications  Medication Sig Dispense Refill   acetaminophen-codeine (TYLENOL #3) 300-30 MG tablet Take 1 tablet by mouth every 8 (eight) hours as needed for moderate pain. 30 tablet 0   atorvastatin (LIPITOR) 40 MG tablet Take 1 tablet (40 mg total) by mouth daily. 90 tablet 3   citalopram (CELEXA) 20 MG tablet Take 1 tablet (20 mg total) by mouth daily. 30 tablet 3   docusate sodium (COLACE) 100 MG capsule Take 1 capsule (100 mg total) by mouth daily as needed. 30 capsule 2   hydrochlorothiazide (HYDRODIURIL) 25 MG tablet Take 1 tablet (25 mg total) by mouth daily. 90 tablet 3   melatonin 3 MG TABS tablet Take 3 mg by mouth at bedtime as needed (sleep).     methocarbamol (ROBAXIN) 500 MG tablet Take 1 tablet (500 mg total) by mouth 2 (two) times daily as needed. To be taken after surgery 20 tablet 0   metoprolol succinate (TOPROL-XL) 25 MG 24 hr tablet Take 25 mg by mouth daily.     ondansetron (ZOFRAN) 4 MG tablet Take 1 tablet (4 mg total) by mouth every 8 (eight) hours as needed for nausea or vomiting. 40 tablet 0   oxyCODONE-acetaminophen (PERCOCET) 5-325 MG tablet Take 1 tablet by mouth every 4 (four) hours as needed. To be taken after surgery 40 tablet 0   potassium chloride (KLOR-CON M) 10 MEQ tablet Take 1 tablet (10 mEq total) by mouth 2 (two) times daily. Please start ASAP 32 tablet 0   rivaroxaban (XARELTO) 20 MG TABS tablet TAKE 1 TABLET BY MOUTH  ONCE DAILY WITH SUPPER 30 tablet 3   valsartan (DIOVAN) 80 MG tablet Take 1 tablet (80 mg total) by mouth daily. (Patient taking differently: Take 80 mg by mouth at bedtime.) 90 tablet 3   No current facility-administered medications for this visit.    PHYSICAL EXAMINATION: ECOG PERFORMANCE STATUS: {CHL ONC ECOG [phone]}  There were no vitals filed for this visit. There were no vitals filed for this visit.  BREAST:*** No palpable masses or nodules in either right or left breasts. No palpable axillary supraclavicular or infraclavicular adenopathy no breast tenderness or nipple discharge. (exam performed in the presence of a chaperone)  LABORATORY DATA:  I have reviewed the data as listed    Latest Ref Rng & Units 08/31/2021    5:48 AM 08/23/2021    2:59 PM 07/26/2020   10:18 AM  CMP  Glucose 70 - 99 mg/dL 182  118  108   BUN 8 - 23 mg/dL 15  21  15    Creatinine 0.44 - 1.00 mg/dL 0.93  0.73  0.74   Sodium 135 - 145 mmol/L 139  139  140   Potassium 3.5 - 5.1 mmol/L 4.3  3.0  4.1   Chloride 98 - 111 mmol/L 101  104  102   CO2 22 - 32 mmol/L 26  25  19    Calcium 8.9 - 10.3 mg/dL 8.9  9.3  9.6   Total Protein 6.5 - 8.1 g/dL  6.2  7.1   Total Bilirubin 0.3 - 1.2 mg/dL  2.3  1.1   Alkaline Phos 38 - 126 U/L  67  117   AST 15 - 41 U/L  20  18   ALT 0 - 44 U/L  21  25     Lab Results  Component Value Date   WBC 10.3 08/31/2021   HGB 11.4 (L) 08/31/2021   HCT 34.4 (L) 08/31/2021   MCV 91.5 08/31/2021   PLT 179 08/31/2021   NEUTROABS 3.1 08/23/2021    ASSESSMENT & PLAN:  No problem-specific Assessment & Plan notes found for this encounter.    No orders of the defined types were placed in this encounter.  The patient has a good understanding of the overall plan. she agrees with it. she will call with any problems that may develop before the next visit here. Total time spent: 30 mins including face to face time and time spent for planning, charting and co-ordination of  care   Suzzette Righter, [place] 04/16/22    I Gardiner Coins am scribing for Dr. Lindi Adie  ***

## 2022-04-17 DIAGNOSIS — M2740 Unspecified cyst of jaw: Secondary | ICD-10-CM | POA: Diagnosis not present

## 2022-04-17 DIAGNOSIS — I10 Essential (primary) hypertension: Secondary | ICD-10-CM | POA: Diagnosis not present

## 2022-04-17 DIAGNOSIS — R600 Localized edema: Secondary | ICD-10-CM | POA: Diagnosis not present

## 2022-04-17 DIAGNOSIS — F419 Anxiety disorder, unspecified: Secondary | ICD-10-CM | POA: Diagnosis not present

## 2022-04-19 ENCOUNTER — Other Ambulatory Visit: Payer: Self-pay

## 2022-04-19 ENCOUNTER — Inpatient Hospital Stay: Payer: BC Managed Care – PPO | Attending: Hematology and Oncology | Admitting: Hematology and Oncology

## 2022-04-19 VITALS — BP 168/71 | HR 78 | Temp 97.3°F | Resp 18 | Ht 66.0 in | Wt 204.3 lb

## 2022-04-19 DIAGNOSIS — I82401 Acute embolism and thrombosis of unspecified deep veins of right lower extremity: Secondary | ICD-10-CM

## 2022-04-19 DIAGNOSIS — Z7901 Long term (current) use of anticoagulants: Secondary | ICD-10-CM | POA: Diagnosis not present

## 2022-04-19 DIAGNOSIS — Z86718 Personal history of other venous thrombosis and embolism: Secondary | ICD-10-CM | POA: Insufficient documentation

## 2022-04-19 MED ORDER — RIVAROXABAN 20 MG PO TABS: 20.0000 mg | ORAL_TABLET | Freq: Every day | ORAL | 6 refills | Status: DC

## 2022-04-19 NOTE — Assessment & Plan Note (Signed)
First DVT 2018 status post knee surgery Acute DVT of the right lower extremity. She is referred by [facility]. She presented to her PCP for right leg pain and had a history of DVT. [place] on 08/04/20 showed acute superficial vein thrombosis involving the right great saphenous vein. [place] on 2020/08/26 showed a thrombus involving the great saphenous vein at the saphofemoral junction, extending slightly into the common femoral vein.    Current Treatment: Xarelto started on 08-26-2020. Grandmother died of blood clots, her mother and her 5 sisters all had blood clots   Hypercoagulability work-up:  Negative blood (factor V Leiden, prothrombin gene mutation, protein C, protein S, Antithrombin III: All normal) Antiphospholipid antibody positive by repeat testing 3 months apart.   My recommendation is indefinite anticoagulation. Return to clinic on an as-needed basis.

## 2022-05-01 DIAGNOSIS — K118 Other diseases of salivary glands: Secondary | ICD-10-CM | POA: Diagnosis not present

## 2022-05-08 ENCOUNTER — Other Ambulatory Visit (HOSPITAL_COMMUNITY): Payer: Self-pay | Admitting: Internal Medicine

## 2022-05-08 DIAGNOSIS — D37030 Neoplasm of uncertain behavior of the parotid salivary glands: Secondary | ICD-10-CM | POA: Diagnosis not present

## 2022-05-08 DIAGNOSIS — I10 Essential (primary) hypertension: Secondary | ICD-10-CM | POA: Diagnosis not present

## 2022-05-08 DIAGNOSIS — F419 Anxiety disorder, unspecified: Secondary | ICD-10-CM | POA: Diagnosis not present

## 2022-05-09 ENCOUNTER — Other Ambulatory Visit (HOSPITAL_COMMUNITY): Payer: Self-pay | Admitting: Internal Medicine

## 2022-05-09 ENCOUNTER — Encounter: Payer: Self-pay | Admitting: *Deleted

## 2022-05-09 DIAGNOSIS — D37030 Neoplasm of uncertain behavior of the parotid salivary glands: Secondary | ICD-10-CM

## 2022-05-09 NOTE — Progress Notes (Unsigned)
Per MD okay for pt to hold Xarelto 48 hrs prior to upcoming biopsy and resume day after biopsy. Pt educated and verbalized understanding.

## 2022-05-09 NOTE — Progress Notes (Signed)
Michaelle Birks, MD  [name]; P Ir Procedure Requests BIOPSY REVIEW Date: 05/08/22  Requested Biopsy site: L parotid Reason for request: Mass  Imaging review: None available. Report from OSH CT Recommended imaging modality to perform biopsy: Ultrasound  Additional comments: @Schedulers , please book for soft tissue ultrasound at L parotid. Can be performed at same day as anticipated procedure, but has to be reviewed by IR Rad before proceeding with biopsy.  Please contact me with questions, concerns, or if issue pertaining to this request arise.  Michaelle Birks, MD [facility] Radiology  Pager. [place]

## 2022-05-29 ENCOUNTER — Other Ambulatory Visit: Payer: Self-pay | Admitting: Student

## 2022-05-30 ENCOUNTER — Other Ambulatory Visit (HOSPITAL_COMMUNITY): Payer: Self-pay | Admitting: Interventional Radiology

## 2022-05-30 ENCOUNTER — Ambulatory Visit (HOSPITAL_COMMUNITY)
Admission: RE | Admit: 2022-05-30 | Discharge: 2022-05-30 | Disposition: A | Discharge status: Home / Self Care | Payer: BC Managed Care – PPO | Source: Ambulatory Visit | Attending: Interventional Radiology | Admitting: Interventional Radiology

## 2022-05-30 ENCOUNTER — Other Ambulatory Visit (HOSPITAL_COMMUNITY): Payer: Self-pay | Admitting: Internal Medicine

## 2022-05-30 ENCOUNTER — Ambulatory Visit (HOSPITAL_COMMUNITY)
Admission: RE | Admit: 2022-05-30 | Discharge: 2022-05-30 | Disposition: A | Discharge status: Home / Self Care | Payer: BC Managed Care – PPO | Source: Ambulatory Visit | Attending: Internal Medicine | Admitting: Internal Medicine

## 2022-05-30 ENCOUNTER — Other Ambulatory Visit: Payer: Self-pay

## 2022-05-30 ENCOUNTER — Ambulatory Visit: Admission: RE | Admit: 2022-05-30 | Payer: BC Managed Care – PPO | Source: Ambulatory Visit

## 2022-05-30 DIAGNOSIS — R220 Localized swelling, mass and lump, head: Secondary | ICD-10-CM | POA: Insufficient documentation

## 2022-05-30 DIAGNOSIS — C07 Malignant neoplasm of parotid gland: Secondary | ICD-10-CM | POA: Diagnosis not present

## 2022-05-30 DIAGNOSIS — E7800 Pure hypercholesterolemia, unspecified: Secondary | ICD-10-CM | POA: Diagnosis not present

## 2022-05-30 DIAGNOSIS — D490 Neoplasm of unspecified behavior of digestive system: Secondary | ICD-10-CM | POA: Diagnosis not present

## 2022-05-30 DIAGNOSIS — D37030 Neoplasm of uncertain behavior of the parotid salivary glands: Secondary | ICD-10-CM

## 2022-05-30 DIAGNOSIS — D110 Benign neoplasm of parotid gland: Secondary | ICD-10-CM | POA: Diagnosis not present

## 2022-05-30 DIAGNOSIS — I10 Essential (primary) hypertension: Secondary | ICD-10-CM | POA: Insufficient documentation

## 2022-05-30 DIAGNOSIS — Z86718 Personal history of other venous thrombosis and embolism: Secondary | ICD-10-CM | POA: Insufficient documentation

## 2022-05-30 DIAGNOSIS — Z7901 Long term (current) use of anticoagulants: Secondary | ICD-10-CM | POA: Diagnosis not present

## 2022-05-30 DIAGNOSIS — Z79899 Other long term (current) drug therapy: Secondary | ICD-10-CM | POA: Insufficient documentation

## 2022-05-30 DIAGNOSIS — K529 Noninfective gastroenteritis and colitis, unspecified: Secondary | ICD-10-CM | POA: Insufficient documentation

## 2022-05-30 DIAGNOSIS — R221 Localized swelling, mass and lump, neck: Secondary | ICD-10-CM | POA: Insufficient documentation

## 2022-05-30 DIAGNOSIS — K118 Other diseases of salivary glands: Secondary | ICD-10-CM | POA: Diagnosis not present

## 2022-05-30 DIAGNOSIS — Z87442 Personal history of urinary calculi: Secondary | ICD-10-CM | POA: Insufficient documentation

## 2022-05-30 LAB — GLUCOSE, CAPILLARY
Glucose-Capillary: 153 mg/dL — ABNORMAL HIGH (ref 70–99)
Glucose-Capillary: 129 mg/dL — ABNORMAL HIGH (ref 70–99)

## 2022-05-30 MED ORDER — SODIUM CHLORIDE 0.9 % IV SOLN: INTRAVENOUS | Status: DC

## 2022-05-30 MED ORDER — LIDOCAINE HCL (PF) 1 % IJ SOLN
INTRAMUSCULAR | Status: AC
  Filled 2022-05-30: qty 30

## 2022-05-30 MED ORDER — FENTANYL CITRATE (PF) 100 MCG/2ML IJ SOLN
INTRAMUSCULAR | Status: AC
  Filled 2022-05-30: qty 2

## 2022-05-30 MED ORDER — LIDOCAINE HCL (PF) 1 % IJ SOLN
5.0000 mL | Freq: Once | INTRAMUSCULAR | Status: AC
  Administered 2022-05-30: 5 mL via INTRADERMAL

## 2022-05-30 MED ORDER — MIDAZOLAM HCL 2 MG/2ML IJ SOLN
INTRAMUSCULAR | Status: AC
  Filled 2022-05-30: qty 2

## 2022-05-30 NOTE — H&P (Signed)
Chief Complaint: Patient was seen in consultation today for parotid mass at the request of Ada L  Referring Physician(s): Elwyn Reach  Supervising Physician: Mir, Sharen Heck  Patient Status: [facility] - Out-pt  History of Present Illness: [name] is a 66 y.o. female with history of nephrolithiasis, colitis, DVT, HTN, and HDL who presents with an approximate 4 mo history of swelling of her left cheek.  Dr. Jonelle Sidle ordered a CT and per  report "Well marginated nodule in the left superficial parotid measuring 1.6 x 1.4 x 2 cm. This lesion statistically most likely reflects pleomorphic adenoma although strictly indeterminate and other neoplasms are not excluded. Biopsy can be obtained ".  Imaging not available for review.  Per recommendation Dr. Maryelizabeth Kaufmann approved case for US guided biopsy.  [name] presents in her usual state of health, denies headache, dizziness, fever/chills, nausea/vomiting, chest pain, palpitations, or SOB.  She has appropriately held her Xarelto and is NPO.   Past Medical History:  Diagnosis Date   Anxiety    Arthritis    Colitis 08/2001   DVT (deep venous thrombosis) ([place])    RLE 06/2016, 08/2020   History of kidney stones    Hypercholesteremia    Hypertension    Renal disorder    kidney stones    Past Surgical History:  Procedure Laterality Date   ABDOMINAL HYSTERECTOMY     BREAST LUMPECTOMY     BUNIONECTOMY     C Section     TOTAL HIP ARTHROPLASTY Right 08/30/2021   Procedure: RIGHT TOTAL HIP ARTHROPLASTY ANTERIOR APPROACH;  Surgeon: Leandrew Koyanagi, MD;  Location: [place];  Service: Orthopedics;  Laterality: Right;    Allergies: Penicillins, Shellfish-derived products, and Morphine and related  Medications: Prior to Admission medications   Medication Sig Start Date End Date Taking? Authorizing Provider  acetaminophen (TYLENOL) 650 MG CR tablet Take 650-1,300 mg by mouth every 8 (eight) hours as needed for pain.   Yes [provider]  atorvastatin (LIPITOR) 20 MG tablet Take 40 mg by mouth daily.   Yes [provider]  citalopram (CELEXA) 20 MG tablet Take 1 tablet (20 mg total) by mouth daily. 07/26/20  Yes Just, Laurita Quint, FNP  furosemide (LASIX) 40 MG tablet Take 40 mg by mouth daily.   Yes [provider]  hydrochlorothiazide (HYDRODIURIL) 25 MG tablet Take 1 tablet (25 mg total) by mouth daily. 07/26/20  Yes Just, Laurita Quint, FNP  melatonin 5 MG TABS Take 5 mg by mouth at bedtime as needed (sleep).   Yes [provider]  metFORMIN (GLUCOPHAGE) 500 MG tablet Take 500 mg by mouth 2 (two) times daily.   Yes [provider]  metoprolol succinate (TOPROL-XL) 25 MG 24 hr tablet Take 25 mg by mouth daily. 06/22/20  Yes [provider]  rivaroxaban (XARELTO) 20 MG TABS tablet Take 1 tablet (20 mg total) by mouth daily with supper. 04/19/22  Yes Nicholas Lose, MD  valsartan (DIOVAN) 80 MG tablet Take 1 tablet (80 mg total) by mouth daily. Patient taking differently: Take 80 mg by mouth at bedtime. 07/26/20  Yes Just, Laurita Quint, FNP     Family History  Problem Relation Age of Onset   Lupus Mother    Parkinson's disease Mother    Cancer Father    Cancer Sister    Cancer Brother     Social History   Socioeconomic History   Marital status: Widowed    Spouse name: Not on file  Number of children: Not on file   Years of education: Not on file   Highest education level: Not on file  Occupational History   Not on file  Tobacco Use   Smoking status: Former    Packs/day: 1.00    Years: 42.00    Total pack years: 42.00    Types: Cigarettes    Quit date: 07/27/2015    Years since quitting: 6.8   Smokeless tobacco: Never  Vaping Use   Vaping Use: Never used  Substance and Sexual Activity   Alcohol use: Yes    Alcohol/week: 0.0 standard drinks of alcohol    Comment: occasional   Drug use: No   Sexual activity: Yes    Birth control/protection: Condom  Other Topics  Concern   Not on file  Social History Narrative   Not on file   Social Determinants of Health   Financial Resource Strain: Not on file  Food Insecurity: Not on file  Transportation Needs: Not on file  Physical Activity: Not on file  Stress: Not on file  Social Connections: Not on file    Review of Systems: A 12 point ROS discussed and pertinent positives are indicated in the HPI above.  All other systems are negative.  Vital Signs: BP 124/68   Pulse 88   Temp 97.9 F (36.6 C) (Temporal)   Resp 16   Ht 5' 6"  (1.676 m)   Wt 204 lb (92.5 kg)   SpO2 95%   BMI 32.93 kg/m   Physical Exam Vitals reviewed.  Constitutional:      General: She is not in acute distress.    Appearance: Normal appearance. She is not ill-appearing.  HENT:     Head: Normocephalic and atraumatic.     Mouth/Throat:     Mouth: Mucous membranes are dry.     Pharynx: Oropharynx is clear.  Eyes:     Extraocular Movements: Extraocular movements intact.     Conjunctiva/sclera: Conjunctivae normal.  Neck:     Comments: Mild tenderness of soft tissue at, below, and in front of left jaw line. Cardiovascular:     Rate and Rhythm: Normal rate and regular rhythm.     Pulses: Normal pulses.  Pulmonary:     Effort: Pulmonary effort is normal. No respiratory distress.     Breath sounds: Normal breath sounds.  Abdominal:     General: Abdomen is flat.     Palpations: Abdomen is soft.  Skin:    General: Skin is warm and dry.  Neurological:     General: No focal deficit present.     Mental Status: She is alert and oriented to person, place, and time.  Psychiatric:        Mood and Affect: Mood normal.        Behavior: Behavior normal.   Imaging: No results found.  Labs:  CBC: Recent Labs    08/23/21 1459 08/31/21 0548  WBC 4.7 10.3  HGB 12.9 11.4*  HCT 39.7 34.4*  PLT 193 179    COAGS: No results for input(s): "INR", "APTT" in the last 8760 hours.  BMP: Recent Labs    08/23/21 1459  08/31/21 0548  NA 139 139  K 3.0* 4.3  CL 104 101  CO2 25 26  GLUCOSE 118* 182*  BUN 21 15  CALCIUM 9.3 8.9  CREATININE 0.73 0.93  GFRNONAA >60 >60    LIVER FUNCTION TESTS: Recent Labs    08/23/21 1459  BILITOT 2.3*  AST 20  ALT 21  ALKPHOS 67  PROT 6.2*  ALBUMIN 3.9    TUMOR MARKERS: No results for input(s): "AFPTM", "CEA", "CA199", "CHROMGRNA" in the last 8760 hours.  Assessment and Plan:  Ms. Picone is a pleasant 66 year old female with known mass of the left parotid.  She describes it as tender and swollen.  She has been approved for US guided biopsy, is appropriately NPO and has held her Xarelto.  Will plan to proceed with [place] and biopsy.  Patient requests moderate sedation and this can be accommodated.  Expected discharge later this morning.  Risks and benefits of parotid gland biopsy was discussed with the patient and/or patient's family including, but not limited to bleeding, infection, damage to adjacent structures or low yield requiring additional tests.  All of the questions were answered and there is agreement to proceed.  Consent signed and in chart.   Thank you for this interesting consult.  I greatly enjoyed meeting DAYTONA HEDMAN and look forward to participating in their care.  A copy of this report was sent to the requesting provider on this date.  Electronically Signed: Pasty Spillers, PA 05/30/2022, 7:42 AM   I spent a total of 30 Minutes in face to face in clinical consultation, greater than 50% of which was counseling/coordinating care for left parotid mass

## 2022-05-30 NOTE — Progress Notes (Signed)
Pt and sister received d/c instructions. All questions and concerns addressed. Procedure site bandaid in place. No drainage noted. Denies any acute pain or discomfort. Left in stable condition after conversation was had w/ MD to make sure all results were reviewed prior to d/c.

## 2022-05-30 NOTE — Procedures (Signed)
Interventional Radiology Procedure Note  Procedure: US guided left parotid mass biopsy  Indication: Left parotid mass  Findings: Please refer to procedural dictation for full description.  Complications: None  EBL: < 10 mL  Miachel Roux, MD [phone]

## 2022-05-30 NOTE — Procedures (Addendum)
Procedure complete. Unable to document in sedation narrator.   Patient tolerated procedure well. See flowsheet for vital sign details during procedure. Procedure time 13 minutes. 1.5 mg of Versed given and 50 mcg of Fentanyl. Patient denies any pain post procedure. Pressure being held at biopsy site for 5 minutes by ultrasound tech.   Patient transported to short stay @ 840 to room 7. Patient alert/oriented x 4. Vital signs stable. Left parotid dressing (clean, dry, intact). Report given to [place], Therapist, sports. Medication wasted in pyxis with Vaughan Basta, RN (0.83m Versed and 512m of Fentanyl).

## 2022-05-31 LAB — SURGICAL PATHOLOGY

## 2022-06-13 DIAGNOSIS — D367 Benign neoplasm of other specified sites: Secondary | ICD-10-CM | POA: Diagnosis not present

## 2022-06-13 DIAGNOSIS — E042 Nontoxic multinodular goiter: Secondary | ICD-10-CM | POA: Diagnosis not present

## 2022-06-26 ENCOUNTER — Ambulatory Visit (INDEPENDENT_AMBULATORY_CARE_PROVIDER_SITE_OTHER): Payer: BC Managed Care – PPO

## 2022-06-26 ENCOUNTER — Ambulatory Visit (INDEPENDENT_AMBULATORY_CARE_PROVIDER_SITE_OTHER): Payer: BC Managed Care – PPO | Admitting: Orthopaedic Surgery

## 2022-06-26 DIAGNOSIS — Z96641 Presence of right artificial hip joint: Secondary | ICD-10-CM

## 2022-06-26 NOTE — Progress Notes (Signed)
   Post-Op Visit Note   Patient: [name]           Date of Birth: [birth year]           MRN: [id number] Visit Date: 06/26/2022 PCP: Elwyn Reach, MD   Assessment & Plan:  Chief Complaint:  Chief Complaint  Patient presents with   Right Hip - Routine Post Op   Visit Diagnoses:  1. Status post total replacement of right hip     Plan: Patient is a pleasant 67 year old female who comes in today 10 months status post right total hip replacement 08/30/2021.  She has been doing very well.  She notes occasional discomfort when she has been standing a long time but nothing more.  Examination of the right hip reveals painless hip flexion and logroll.  She is neurovascular intact distally.  At this point, she will continue to advance activity as tolerated.  Dental prophylaxis reinforced.  Follow-up in around 1 year for repeat evaluation and AP pelvis x-rays.  Call with concerns or questions.  Follow-Up Instructions: Return in about 1 year (around 06/27/2023), or if symptoms worsen or fail to improve.   Orders:  Orders Placed This Encounter  Procedures   XR Pelvis 1-2 Views   No orders of the defined types were placed in this encounter.   Imaging: No results found.  PMFS History: Patient Active Problem List   Diagnosis Date Noted   Primary osteoarthritis of right hip 08/30/2021   Status post total replacement of right hip 08/30/2021   DVT (deep venous thrombosis) ([facility])    Acute thromboembolism of deep veins of right lower extremity ([place]) 07/26/2020   Depression 07/15/2007   COLLAGENOUS COLITIS 07/15/2007   WEIGHT LOSS 07/15/2007   HEADACHE 07/15/2007   COLONIC POLYPS, BENIGN 02/19/2001   Past Medical History:  Diagnosis Date   Anxiety    Arthritis    Colitis 08/2001   DVT (deep venous thrombosis) ([place])    RLE 06/2016, 08/2020   History of kidney stones    Hypercholesteremia    Hypertension    Renal disorder    kidney stones    Family History  Problem Relation  Age of Onset   Lupus Mother    Parkinson's disease Mother    Cancer Father    Cancer Sister    Cancer Brother     Past Surgical History:  Procedure Laterality Date   ABDOMINAL HYSTERECTOMY     BREAST LUMPECTOMY     BUNIONECTOMY     C Section     TOTAL HIP ARTHROPLASTY Right 08/30/2021   Procedure: RIGHT TOTAL HIP ARTHROPLASTY ANTERIOR APPROACH;  Surgeon: Leandrew Koyanagi, MD;  Location: [facility];  Service: Orthopedics;  Laterality: Right;   Social History   Occupational History   Not on file  Tobacco Use   Smoking status: Former    Packs/day: 1.00    Years: 42.00    Total pack years: 42.00    Types: Cigarettes    Quit date: 07/27/2015    Years since quitting: 6.9   Smokeless tobacco: Never  Vaping Use   Vaping Use: Never used  Substance and Sexual Activity   Alcohol use: Yes    Alcohol/week: 0.0 standard drinks of alcohol    Comment: occasional   Drug use: No   Sexual activity: Yes    Birth control/protection: Condom

## 2022-08-03 ENCOUNTER — Telehealth: Payer: Self-pay | Admitting: *Deleted

## 2022-08-03 NOTE — Telephone Encounter (Signed)
Received call from pt stating she is scheduled for thyroid surgery in 2 weeks and requesting advice from MD for Xarelto.  Based on last office note, pt has been discharged to her PCP for f/u on anticoagulation and is no longer being seen in our clinic.  Pt educated to contact PCP for advice as to when Xarelto should be stopped and restarted and if Lovenox is needed or not.  Pt verbalized understanding.

## 2022-08-10 NOTE — H&P (Signed)
HPI:   [name] is a 67 y.o. female who presents as a consult Patient.   Referring Provider: Blima Rich, *  Chief complaint: Parotid mass.  HPI: Painful left parotid mass. Imaging and biopsy had been performed already. No history of skin cancer.  PMH/Meds/All/SocHx/FamHx/ROS:   History reviewed. No pertinent past medical history.  History reviewed. No pertinent surgical history.  No family history of bleeding disorders, wound healing problems or difficulty with anesthesia.   Social History   Socioeconomic History   Marital status: Widowed  Spouse name: Not on file   Number of children: Not on file   Years of education: Not on file   Highest education level: Not on file  Occupational History   Not on file  Tobacco Use   Smoking status: Former  Packs/day: 1.00  Years: 35.00  Additional pack years: 0.00  Total pack years: 35.00  Types: Cigarettes  Quit date: 2017  Years since quitting: 7.0   Smokeless tobacco: Never  Vaping Use   Vaping Use: Never used  Substance and Sexual Activity   Alcohol use: Never   Drug use: Never   Sexual activity: Not on file  Other Topics Concern   Not on file  Social History Narrative   Not on file   Social Determinants of Health   Food Insecurity: Not on file  Transportation Needs: Not on file  Living Situation: Not on file   Current Outpatient Medications:   ACCU-CHEK GUIDE ME GLUCOSE MTR Misc, USE AS DIRECTED, Disp: , Rfl:   ACCU-CHEK GUIDE TEST STRIPS Strp test strips, USE 1 ONCE DAILY, Disp: , Rfl:   ACCU-CHEK SOFTCLIX LANCETS lancets, USE 1 TO CHECK GLUCOSE ONCE DAILY, Disp: , Rfl:   acetaminophen (TYLENOL) 650 MG CR tablet, Take 1-2 tablets (650-1,300 mg total) by mouth., Disp: , Rfl:   ALPRAZolam (XANAX) 0.5 MG tablet, Take 1 tablet (0.5 mg total) by mouth every 6 (six) hours as needed., Disp: , Rfl:   atorvastatin (LIPITOR) 20 MG tablet, Take 2 tablets (40 mg total) by mouth daily., Disp: , Rfl:    citalopram (CELEXA) 20 MG tablet, Take 1 tablet (20 mg total) by mouth daily., Disp: , Rfl:   cyclobenzaprine (FLEXERIL) 10 MG tablet, Take 1 tablet (10 mg total) by mouth 3 (three) times daily as needed., Disp: , Rfl:   furosemide (LASIX) 40 MG tablet, Take 1 tablet (40 mg total) by mouth daily., Disp: , Rfl:   hydroCHLOROthiazide (HYDRODIURIL) 25 MG tablet, Take 1 tablet (25 mg total) by mouth daily., Disp: , Rfl:   ibuprofen (ADVIL,MOTRIN) 200 MG tablet, Take 1 tablet (200 mg total) by mouth., Disp: , Rfl:   lisinopriL (PRINIVIL,ZESTRIL) 10 MG tablet, Take 1 tablet (10 mg total) by mouth daily., Disp: , Rfl:   meclizine (ANTIVERT) 25 mg tablet, Take 1 tablet (25 mg total) by mouth every 6 (six) hours as needed., Disp: , Rfl:   melatonin-pyridoxine, vit B6, (MELATONIN, WITH B6,) 5-1 mg Tab, Take 5 mg by mouth., Disp: , Rfl:   metFORMIN (GLUCOPHAGE) 500 MG tablet, Take 1 tablet (500 mg total) by mouth 2 times daily., Disp: , Rfl:   metoPROLOL succinate (TOPROL-XL) 25 MG 24 hr tablet, , Disp: , Rfl:   simvastatin (ZOCOR) 40 MG tablet, Take 1 tablet (40 mg total) by mouth nightly., Disp: , Rfl:   valsartan (DIOVAN) 80 MG tablet, Take 1 tablet (80 mg total) by mouth daily., Disp: , Rfl:   XARELTO 20 mg tablet *ANTICOAGULANT*,  TAKE 1 TABLET BY MOUTH ONCE DAILY WITH SUPPER, Disp: , Rfl:   A complete ROS was performed with pertinent positives/negatives noted in the HPI. The remainder of the ROS are negative.   Physical Exam:   Pulse 79  Temp 97.9 F (36.6 C) (Temporal)  Resp 18  Ht 1.638 m (5' 4.5")  Wt 88.4 kg (194 lb 14.4 oz)  SpO2 93%  BMI 32.94 kg/m   General: Healthy and alert, in no distress, breathing easily. Normal affect. In a pleasant mood. Head: Normocephalic, atraumatic. No masses, or scars. Eyes: Pupils are equal, and reactive to light. Vision is grossly intact. No spontaneous or gaze nystagmus. Ears: Ear canals are clear. Tympanic membranes are intact, with normal landmarks  and the middle ears are clear and healthy. Hearing: Grossly normal. Nose: Nasal cavities are clear with healthy mucosa, no polyps or exudate. Airways are patent. Face: Very tender left parotid mass that is difficult to palpate distinctly, otherwise no masses or scars, facial nerve function is symmetric. Oral Cavity: No mucosal abnormalities are noted. Tongue with normal mobility. Dentition appears healthy. Oropharynx: Tonsils are symmetric. There are no mucosal masses identified. Tongue base appears normal and healthy. Larynx/Hypopharynx: deferred Chest: Deferred Neck: No palpable masses, no cervical adenopathy, no thyroid nodules or enlargement. Neuro: Cranial nerves II-XII with normal function. Balance: Normal gate. Other findings: none.  Independent Review of Additional Tests or Records:  FNA of left parotid:  A. INTRA-PAROTID MASS, LEFT, NEEDLE CORE BIOPSY:  Oncocytic neoplasm.  See comment.   COMMENT:  The core biopsies show sheets of cells with abundant eosinophilic  cytoplasm consistent with oncocytic neoplasm.  The primary differential  diagnostic consideration is oncocytoma.   IMPRESSION:   1.3 x 1.6 x 2.1 cm solid lesion in the superficial lobe of the left parotid gland, not significantly changed compared with 05/01/2022   Procedures:  none  Impression & Plans:  Left parotid mass. 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 The only way to be 100% sure of the diagnosis is with excision and formal pathology. This requires a superficial parotidectomy with facial nerve dissection. This was discussed in detail including the anesthesia, the incision, the drain, overnight stay, risk of temporary or permanent facial nerve weakness. She understands all of this and agrees.

## 2022-08-16 ENCOUNTER — Other Ambulatory Visit: Payer: Self-pay

## 2022-08-16 ENCOUNTER — Encounter (HOSPITAL_COMMUNITY): Payer: Self-pay | Admitting: Otolaryngology

## 2022-08-16 NOTE — Progress Notes (Signed)
Ms Fritzie Lindh denies chest pain or shortness of breath. Patient denies having any s/s of Covid in her household, also denies any known exposure to Covid. Ms Alsop states she does not have s/s of pneumonia or  lower respiratory tract infection.  Patient also denies any cough . Sneezing, running nose or any s/s of an upper respiratory infection.  Ms Montesano PCP is Dr. Harlow Ohms. Patient, has pre-diabetes, she takes Metformin.  Ms [place]worth checks CBGs- she states the highest has been 136.

## 2022-08-17 DIAGNOSIS — D110 Benign neoplasm of parotid gland: Secondary | ICD-10-CM

## 2022-08-17 HISTORY — DX: Benign neoplasm of parotid gland: D11.0

## 2022-08-20 ENCOUNTER — Other Ambulatory Visit: Payer: Self-pay

## 2022-08-20 ENCOUNTER — Encounter (HOSPITAL_COMMUNITY)
Admission: RE | Disposition: A | Discharge status: Home / Self Care | Payer: Self-pay | Source: Ambulatory Visit | Attending: Otolaryngology

## 2022-08-20 ENCOUNTER — Ambulatory Visit (HOSPITAL_COMMUNITY): Payer: BC Managed Care – PPO | Admitting: Certified Registered Nurse Anesthetist

## 2022-08-20 ENCOUNTER — Encounter (HOSPITAL_COMMUNITY): Payer: Self-pay | Admitting: Otolaryngology

## 2022-08-20 ENCOUNTER — Observation Stay (HOSPITAL_COMMUNITY)
Admission: RE | Admit: 2022-08-20 | Discharge: 2022-08-21 | Disposition: A | Discharge status: Home / Self Care | Payer: BC Managed Care – PPO | Source: Ambulatory Visit | Attending: Otolaryngology | Admitting: Otolaryngology

## 2022-08-20 DIAGNOSIS — R7309 Other abnormal glucose: Secondary | ICD-10-CM | POA: Diagnosis not present

## 2022-08-20 DIAGNOSIS — R221 Localized swelling, mass and lump, neck: Principal | ICD-10-CM | POA: Insufficient documentation

## 2022-08-20 DIAGNOSIS — Z87891 Personal history of nicotine dependence: Secondary | ICD-10-CM | POA: Insufficient documentation

## 2022-08-20 DIAGNOSIS — K118 Other diseases of salivary glands: Secondary | ICD-10-CM | POA: Diagnosis present

## 2022-08-20 HISTORY — DX: Headache, unspecified: R51.9

## 2022-08-20 HISTORY — DX: Personal history of other medical treatment: Z92.89

## 2022-08-20 HISTORY — DX: Pneumonia, unspecified organism: J18.9

## 2022-08-20 HISTORY — DX: Malignant (primary) neoplasm, unspecified: C80.1

## 2022-08-20 HISTORY — DX: Prediabetes: R73.03

## 2022-08-20 HISTORY — PX: PAROTIDECTOMY: SHX2163

## 2022-08-20 LAB — POCT I-STAT, CHEM 8
BUN: 17 mg/dL (ref 8–23)
Calcium, Ion: 1.27 mmol/L (ref 1.15–1.40)
Chloride: 104 mmol/L (ref 98–111)
Creatinine, Ser: 0.8 mg/dL (ref 0.44–1.00)
Glucose, Bld: 129 mg/dL — ABNORMAL HIGH (ref 70–99)
HCT: 36 % (ref 36.0–46.0)
Hemoglobin: 12.2 g/dL (ref 12.0–15.0)
Potassium: 3.2 mmol/L — ABNORMAL LOW (ref 3.5–5.1)
Sodium: 143 mmol/L (ref 135–145)
TCO2: 27 mmol/L (ref 22–32)

## 2022-08-20 LAB — GLUCOSE, CAPILLARY
Glucose-Capillary: 112 mg/dL — ABNORMAL HIGH (ref 70–99)
Glucose-Capillary: 244 mg/dL — ABNORMAL HIGH (ref 70–99)

## 2022-08-20 LAB — BASIC METABOLIC PANEL
Anion gap: 5 (ref 5–15)
BUN: 15 mg/dL (ref 8–23)
CO2: 31 mmol/L (ref 22–32)
Calcium: 9.3 mg/dL (ref 8.9–10.3)
Chloride: 102 mmol/L (ref 98–111)
Creatinine, Ser: 0.87 mg/dL (ref 0.44–1.00)
GFR, Estimated: >60 mL/min (ref >60)
Glucose, Bld: 120 mg/dL — ABNORMAL HIGH (ref 70–99)
Potassium: 2.8 mmol/L — ABNORMAL LOW (ref 3.5–5.1)
Sodium: 138 mmol/L (ref 135–145)

## 2022-08-20 LAB — CBC
HCT: 39.4 % (ref 36.0–46.0)
Hemoglobin: 13.2 g/dL (ref 12.0–15.0)
MCH: 29.5 pg (ref 26.0–34.0)
MCHC: 33.5 g/dL (ref 30.0–36.0)
MCV: 87.9 fL (ref 80.0–100.0)
Platelets: 217 10*3/uL (ref 150–400)
RBC: 4.48 MIL/uL (ref 3.87–5.11)
RDW: 13.6 % (ref 11.5–15.5)
WBC: 4.5 10*3/uL (ref 4.0–10.5)
nRBC: 0 % (ref 0.0–0.2)

## 2022-08-20 SURGERY — EXCISION, PAROTID GLAND
Anesthesia: General | Laterality: Left

## 2022-08-20 MED ORDER — IBUPROFEN 100 MG/5ML PO SUSP: 400.0000 mg | Freq: Four times a day (QID) | ORAL | Status: DC | PRN

## 2022-08-20 MED ORDER — PROPOFOL 10 MG/ML IV BOLUS
INTRAVENOUS | Status: AC
  Filled 2022-08-20: qty 20

## 2022-08-20 MED ORDER — PROPOFOL 10 MG/ML IV BOLUS
INTRAVENOUS | Status: DC | PRN
  Administered 2022-08-20 (×2): 50 mg via INTRAVENOUS
  Administered 2022-08-20: 100 mg via INTRAVENOUS

## 2022-08-20 MED ORDER — HYDROCODONE-ACETAMINOPHEN 5-325 MG PO TABS
1.0000 | ORAL_TABLET | ORAL | Status: DC | PRN
  Administered 2022-08-20 (×2): 2 via ORAL
  Administered 2022-08-21 (×2): 1 via ORAL
  Filled 2022-08-20: qty 2
  Filled 2022-08-20 (×2): qty 1
  Filled 2022-08-20: qty 2

## 2022-08-20 MED ORDER — DEXAMETHASONE SODIUM PHOSPHATE 10 MG/ML IJ SOLN
INTRAMUSCULAR | Status: DC | PRN
  Administered 2022-08-20: 10 mg via INTRAVENOUS

## 2022-08-20 MED ORDER — POTASSIUM CHLORIDE 10 MEQ/100ML IV SOLN
10.0000 meq | Freq: Once | INTRAVENOUS | Status: AC
  Administered 2022-08-20: 10 meq via INTRAVENOUS
  Filled 2022-08-20: qty 100

## 2022-08-20 MED ORDER — POTASSIUM CHLORIDE 2 MEQ/ML IV SOLN
INTRAVENOUS | Status: DC
  Filled 2022-08-20 (×3): qty 1000

## 2022-08-20 MED ORDER — AMISULPRIDE (ANTIEMETIC) 5 MG/2ML IV SOLN: 10.0000 mg | Freq: Once | INTRAVENOUS | Status: DC | PRN

## 2022-08-20 MED ORDER — FENTANYL CITRATE (PF) 250 MCG/5ML IJ SOLN
INTRAMUSCULAR | Status: DC | PRN
  Administered 2022-08-20: 150 ug via INTRAVENOUS
  Administered 2022-08-20: 50 ug via INTRAVENOUS

## 2022-08-20 MED ORDER — ONDANSETRON HCL 4 MG/2ML IJ SOLN: 4.0000 mg | INTRAMUSCULAR | Status: DC | PRN

## 2022-08-20 MED ORDER — EPHEDRINE 5 MG/ML INJ
INTRAVENOUS | Status: AC
  Filled 2022-08-20: qty 5

## 2022-08-20 MED ORDER — HYDROCODONE-ACETAMINOPHEN 7.5-325 MG PO TABS: 1.0000 | ORAL_TABLET | Freq: Four times a day (QID) | ORAL | 0 refills | Status: DC | PRN

## 2022-08-20 MED ORDER — FENTANYL CITRATE (PF) 250 MCG/5ML IJ SOLN
INTRAMUSCULAR | Status: AC
  Filled 2022-08-20: qty 5

## 2022-08-20 MED ORDER — LIDOCAINE-EPINEPHRINE 1 %-1:100000 IJ SOLN
INTRAMUSCULAR | Status: AC
  Filled 2022-08-20: qty 1

## 2022-08-20 MED ORDER — BACITRACIN ZINC 500 UNIT/GM EX OINT
TOPICAL_OINTMENT | CUTANEOUS | Status: AC
  Filled 2022-08-20: qty 28.35

## 2022-08-20 MED ORDER — SODIUM CHLORIDE (PF) 0.9 % IJ SOLN
INTRAMUSCULAR | Status: AC
  Filled 2022-08-20: qty 10

## 2022-08-20 MED ORDER — 0.9 % SODIUM CHLORIDE (POUR BTL) OPTIME
TOPICAL | Status: DC | PRN
  Administered 2022-08-20: 1000 mL

## 2022-08-20 MED ORDER — KETOROLAC TROMETHAMINE 15 MG/ML IJ SOLN
INTRAMUSCULAR | Status: AC
  Administered 2022-08-20: 15 mg via INTRAVENOUS
  Filled 2022-08-20: qty 1

## 2022-08-20 MED ORDER — SUCCINYLCHOLINE CHLORIDE 200 MG/10ML IV SOSY
PREFILLED_SYRINGE | INTRAVENOUS | Status: DC | PRN
  Administered 2022-08-20: 80 mg via INTRAVENOUS

## 2022-08-20 MED ORDER — FUROSEMIDE 40 MG PO TABS
40.0000 mg | ORAL_TABLET | Freq: Every day | ORAL | Status: DC
  Administered 2022-08-20 – 2022-08-21 (×2): 40 mg via ORAL
  Filled 2022-08-20 (×2): qty 1

## 2022-08-20 MED ORDER — KETOROLAC TROMETHAMINE 15 MG/ML IJ SOLN: 15.0000 mg | Freq: Once | INTRAMUSCULAR | Status: AC | PRN

## 2022-08-20 MED ORDER — EPHEDRINE SULFATE-NACL 50-0.9 MG/10ML-% IV SOSY
PREFILLED_SYRINGE | INTRAVENOUS | Status: DC | PRN
  Administered 2022-08-20: 10 mg via INTRAVENOUS
  Administered 2022-08-20: 5 mg via INTRAVENOUS

## 2022-08-20 MED ORDER — MELATONIN 5 MG PO TABS: 5.0000 mg | ORAL_TABLET | Freq: Every evening | ORAL | Status: DC | PRN

## 2022-08-20 MED ORDER — MIDAZOLAM HCL 2 MG/2ML IJ SOLN
INTRAMUSCULAR | Status: AC
  Filled 2022-08-20: qty 2

## 2022-08-20 MED ORDER — ONDANSETRON HCL 4 MG/2ML IJ SOLN: 4.0000 mg | Freq: Once | INTRAMUSCULAR | Status: DC | PRN

## 2022-08-20 MED ORDER — FENTANYL CITRATE (PF) 100 MCG/2ML IJ SOLN
INTRAMUSCULAR | Status: AC
  Administered 2022-08-20: 25 ug via INTRAVENOUS
  Filled 2022-08-20: qty 2

## 2022-08-20 MED ORDER — LIDOCAINE 2% (20 MG/ML) 5 ML SYRINGE
INTRAMUSCULAR | Status: DC | PRN
  Administered 2022-08-20: 60 mg via INTRAVENOUS

## 2022-08-20 MED ORDER — METOPROLOL SUCCINATE ER 25 MG PO TB24
25.0000 mg | ORAL_TABLET | Freq: Every day | ORAL | Status: DC
  Administered 2022-08-21: 25 mg via ORAL
  Filled 2022-08-20: qty 1

## 2022-08-20 MED ORDER — LIDOCAINE 2% (20 MG/ML) 5 ML SYRINGE
INTRAMUSCULAR | Status: AC
  Filled 2022-08-20: qty 5

## 2022-08-20 MED ORDER — ATORVASTATIN CALCIUM 40 MG PO TABS
40.0000 mg | ORAL_TABLET | Freq: Every day | ORAL | Status: DC
  Administered 2022-08-20: 40 mg via ORAL
  Filled 2022-08-20: qty 1

## 2022-08-20 MED ORDER — CITALOPRAM HYDROBROMIDE 20 MG PO TABS
20.0000 mg | ORAL_TABLET | Freq: Every day | ORAL | Status: DC
  Administered 2022-08-21: 20 mg via ORAL
  Filled 2022-08-20: qty 1

## 2022-08-20 MED ORDER — ALPRAZOLAM 0.25 MG PO TABS: 0.2500 mg | ORAL_TABLET | Freq: Every day | ORAL | Status: DC | PRN

## 2022-08-20 MED ORDER — MIDAZOLAM HCL 2 MG/2ML IJ SOLN
INTRAMUSCULAR | Status: DC | PRN
  Administered 2022-08-20: 2 mg via INTRAVENOUS

## 2022-08-20 MED ORDER — LACTATED RINGERS IV SOLN: INTRAVENOUS | Status: DC

## 2022-08-20 MED ORDER — ORAL CARE MOUTH RINSE: 15.0000 mL | Freq: Once | OROMUCOSAL | Status: AC

## 2022-08-20 MED ORDER — FENTANYL CITRATE (PF) 100 MCG/2ML IJ SOLN: 25.0000 ug | INTRAMUSCULAR | Status: DC | PRN

## 2022-08-20 MED ORDER — ONDANSETRON HCL 4 MG/2ML IJ SOLN
INTRAMUSCULAR | Status: AC
  Filled 2022-08-20: qty 2

## 2022-08-20 MED ORDER — SODIUM CHLORIDE 0.9 % IV SOLN
0.1500 ug/kg/min | INTRAVENOUS | Status: AC
  Administered 2022-08-20: .1 ug/kg/min via INTRAVENOUS
  Filled 2022-08-20: qty 2000

## 2022-08-20 MED ORDER — ONDANSETRON HCL 4 MG/2ML IJ SOLN
INTRAMUSCULAR | Status: DC | PRN
  Administered 2022-08-20: 4 mg via INTRAVENOUS

## 2022-08-20 MED ORDER — CHLORHEXIDINE GLUCONATE 0.12 % MT SOLN
15.0000 mL | Freq: Once | OROMUCOSAL | Status: AC
  Administered 2022-08-20: 15 mL via OROMUCOSAL
  Filled 2022-08-20: qty 15

## 2022-08-20 MED ORDER — DEXAMETHASONE SODIUM PHOSPHATE 10 MG/ML IJ SOLN
INTRAMUSCULAR | Status: AC
  Filled 2022-08-20: qty 1

## 2022-08-20 MED ORDER — PHENYLEPHRINE HCL-NACL 20-0.9 MG/250ML-% IV SOLN
INTRAVENOUS | Status: DC | PRN
  Administered 2022-08-20: 20 ug/min via INTRAVENOUS

## 2022-08-20 MED ORDER — ACETAMINOPHEN 500 MG PO TABS
1000.0000 mg | ORAL_TABLET | Freq: Once | ORAL | Status: AC
  Administered 2022-08-20: 1000 mg via ORAL
  Filled 2022-08-20: qty 2

## 2022-08-20 MED ORDER — ONDANSETRON HCL 4 MG PO TABS: 4.0000 mg | ORAL_TABLET | ORAL | Status: DC | PRN

## 2022-08-20 MED ORDER — HYDROCHLOROTHIAZIDE 25 MG PO TABS
25.0000 mg | ORAL_TABLET | Freq: Every day | ORAL | Status: DC
  Administered 2022-08-20 – 2022-08-21 (×2): 25 mg via ORAL
  Filled 2022-08-20 (×2): qty 1

## 2022-08-20 MED ORDER — METFORMIN HCL 500 MG PO TABS
500.0000 mg | ORAL_TABLET | Freq: Two times a day (BID) | ORAL | Status: DC
  Administered 2022-08-20 – 2022-08-21 (×2): 500 mg via ORAL
  Filled 2022-08-20 (×2): qty 1

## 2022-08-20 MED ORDER — ONDANSETRON 4 MG PO TBDP: 4.0000 mg | ORAL_TABLET | Freq: Three times a day (TID) | ORAL | 0 refills | Status: DC | PRN

## 2022-08-20 SURGICAL SUPPLY — 52 items
ADH SKN CLS APL DERMABOND .7 (GAUZE/BANDAGES/DRESSINGS) ×1
ADH SKN CLS LQ APL DERMABOND (GAUZE/BANDAGES/DRESSINGS) ×1
ATTRACTOMAT 16X20 MAGNETIC DRP (DRAPES) IMPLANT
BAG COUNTER SPONGE SURGICOUNT (BAG) ×1 IMPLANT
BAG SPNG CNTER NS LX DISP (BAG) ×1
BLADE CLIPPER SURG (BLADE) IMPLANT
BLADE SURG 15 STRL LF DISP TIS (BLADE) IMPLANT
BLADE SURG 15 STRL SS (BLADE)
CANISTER SUCT 3000ML PPV (MISCELLANEOUS) ×1 IMPLANT
CLEANER TIP ELECTROSURG 2X2 (MISCELLANEOUS) ×1 IMPLANT
CNTNR URN SCR LID CUP LEK RST (MISCELLANEOUS) IMPLANT
CONT SPEC 4OZ STRL OR WHT (MISCELLANEOUS)
CORD BIPOLAR FORCEPS 12FT (ELECTRODE) ×1 IMPLANT
COVER SURGICAL LIGHT HANDLE (MISCELLANEOUS) ×1 IMPLANT
DERMABOND ADVANCED .7 DNX12 (GAUZE/BANDAGES/DRESSINGS) ×1 IMPLANT
DERMABOND ADVANCED .7 DNX6 (GAUZE/BANDAGES/DRESSINGS) IMPLANT
DRAIN JACKSON RD 7FR 3/32 (WOUND CARE) IMPLANT
DRAIN JP 10F RND RADIO (DRAIN) IMPLANT
DRAPE HALF SHEET 40X57 (DRAPES) IMPLANT
DRAPE INCISE 23X17 STRL (DRAPES) ×1 IMPLANT
DRAPE INCISE IOBAN 23X17 STRL (DRAPES) ×1 IMPLANT
ELECT COATED BLADE 2.86 ST (ELECTRODE) ×1 IMPLANT
ELECT REM PT RETURN 9FT ADLT (ELECTROSURGICAL) ×1
ELECTRODE REM PT RTRN 9FT ADLT (ELECTROSURGICAL) ×1 IMPLANT
EVACUATOR SILICONE 100CC (DRAIN) IMPLANT
FORCEPS BIPOLAR SPETZLER 8 1.0 (NEUROSURGERY SUPPLIES) ×1 IMPLANT
GAUZE 4X4 16PLY ~~LOC~~+RFID DBL (SPONGE) ×1 IMPLANT
GLOVE ECLIPSE 7.5 STRL STRAW (GLOVE) ×1 IMPLANT
GOWN STRL REUS W/ TWL LRG LVL3 (GOWN DISPOSABLE) ×2 IMPLANT
GOWN STRL REUS W/TWL LRG LVL3 (GOWN DISPOSABLE) ×2
KIT BASIN OR (CUSTOM PROCEDURE TRAY) ×1 IMPLANT
KIT TURNOVER KIT B (KITS) ×1 IMPLANT
LOCATOR NERVE 3 VOLT (DISPOSABLE) IMPLANT
NDL PRECISIONGLIDE 27X1.5 (NEEDLE) IMPLANT
NEEDLE PRECISIONGLIDE 27X1.5 (NEEDLE) IMPLANT
NS IRRIG 1000ML POUR BTL (IV SOLUTION) ×1 IMPLANT
PAD ARMBOARD 7.5X6 YLW CONV (MISCELLANEOUS) ×2 IMPLANT
PENCIL FOOT CONTROL (ELECTRODE) ×1 IMPLANT
PROBE NERVBE PRASS .33 (MISCELLANEOUS) IMPLANT
SHEARS HARMONIC 9CM CVD (BLADE) ×1 IMPLANT
STAPLER VISISTAT 35W (STAPLE) ×1 IMPLANT
SUT CHROMIC 3 0 SH 27 (SUTURE) IMPLANT
SUT CHROMIC 4 0 PS 2 18 (SUTURE) ×1 IMPLANT
SUT ETHILON 2 0 FS 18 (SUTURE) IMPLANT
SUT NYLON ETHILON 5-0 P-3 1X18 (SUTURE) IMPLANT
SUT SILK 2 0 SH CR/8 (SUTURE) ×1 IMPLANT
SUT SILK 4 0 REEL (SUTURE) ×1 IMPLANT
SUT VIC AB 3-0 SH 18 (SUTURE) IMPLANT
SYR CONTROL 10ML LL (SYRINGE) IMPLANT
TOWEL GREEN STERILE FF (TOWEL DISPOSABLE) ×1 IMPLANT
TRAY ENT MC OR (CUSTOM PROCEDURE TRAY) ×1 IMPLANT
TRAY FOLEY MTR SLVR 14FR STAT (SET/KITS/TRAYS/PACK) IMPLANT

## 2022-08-20 NOTE — Progress Notes (Signed)
Pain med given to pt as requested

## 2022-08-20 NOTE — Anesthesia Preprocedure Evaluation (Addendum)
Anesthesia Evaluation  Patient identified by MRN, date of birth, ID band Patient awake    Reviewed: Allergy & Precautions, NPO status , Patient's Chart, lab work & pertinent test results  Airway Mallampati: II  TM Distance: >3 FB Neck ROM: Full    Dental  (+) Missing   Pulmonary former smoker   Pulmonary exam normal        Cardiovascular hypertension, Pt. on medications and Pt. on home beta blockers + DVT  Normal cardiovascular exam     Neuro/Psych  Headaches PSYCHIATRIC DISORDERS Anxiety Depression       GI/Hepatic negative GI ROS, Neg liver ROS,,,  Endo/Other  Pre-DM  Renal/GU negative Renal ROS     Musculoskeletal  (+) Arthritis ,    Abdominal  (+) + obese  Peds  Hematology  (+) Blood dyscrasia (Xarelto)   Anesthesia Other Findings Parotid mass  Reproductive/Obstetrics                             Anesthesia Physical Anesthesia Plan  ASA: 3  Anesthesia Plan: General   Post-op Pain Management:    Induction: Intravenous  PONV Risk Score and Plan: 4 or greater and Ondansetron, Dexamethasone, Midazolam, Propofol infusion and Treatment may vary due to age or medical condition  Airway Management Planned: Oral ETT  Additional Equipment:   Intra-op Plan:   Post-operative Plan: Extubation in OR  Informed Consent: I have reviewed the patients History and Physical, chart, labs and discussed the procedure including the risks, benefits and alternatives for the proposed anesthesia with the patient or authorized representative who has indicated his/her understanding and acceptance.     Dental advisory given  Plan Discussed with: CRNA  Anesthesia Plan Comments:        Anesthesia Quick Evaluation

## 2022-08-20 NOTE — Interval H&P Note (Signed)
History and Physical Interval Note:  08/20/2022 10:33 AM  [name]  has presented today for surgery, with the diagnosis of Parotid mass.  The various methods of treatment have been discussed with the patient and family. After consideration of risks, benefits and other options for treatment, the patient has consented to  Procedure(s) with comments: [place] (Left) - RNFA as a surgical intervention.  The patient's history has been reviewed, patient examined, no change in status, stable for surgery.  I have reviewed the patient's chart and labs.  Questions were answered to the patient's satisfaction.     Izora Gala

## 2022-08-20 NOTE — Anesthesia Procedure Notes (Signed)
Procedure Name: Intubation Date/Time: 08/20/2022 10:59 AM  Performed by: Carolan Clines, CRNAPre-anesthesia Checklist: Patient identified, Emergency Drugs available, Suction available and Patient being monitored Patient Re-evaluated:Patient Re-evaluated prior to induction Oxygen Delivery Method: Circle System Utilized Preoxygenation: Pre-oxygenation with 100% oxygen Induction Type: IV induction and Rapid sequence Laryngoscope Size: Mac and 3 Grade View: Grade I Tube type: Oral Tube size: 7.5 mm Number of attempts: 1 Airway Equipment and Method: Stylet Placement Confirmation: ETT inserted through vocal cords under direct vision, positive ETCO2 and breath sounds checked- equal and bilateral Secured at: 21 cm Tube secured with: Tape Dental Injury: Teeth and Oropharynx as per pre-operative assessment

## 2022-08-20 NOTE — Op Note (Signed)
OPERATIVE REPORT  DATE OF SURGERY: 08/20/2022  PATIENT:  [name],  67 y.o. female  PRE-OPERATIVE DIAGNOSIS:  Parotid mass  POST-OPERATIVE DIAGNOSIS:  Parotid mass  PROCEDURE:  Procedure(s): LEFT SUPERFICIAL PAROTIDECTOMY WITH FACIAL NERVE DISSECTION  SURGEON:  Beckie Salts, MD  ASSISTANTS: Jolene Provost PA  ANESTHESIA:   General   EBL: 30 ml  DRAINS: 7 French round JP  LOCAL MEDICATIONS USED:  None  SPECIMEN: Left parotid  COUNTS:  Correct  PROCEDURE DETAILS: The patient was taken to the operating room and placed on the operating table in the supine position. Following induction of general endotracheal anesthesia, the left side of the face was prepped and draped in a standard fashion. A preauricular incision was outlined with a marking pen with extension down around the mastoid process and 2 fingerbreadths below the angle of the mandible.  The facial nerve monitor was set up with monitoring probes in place.  This was used throughout the case.  Electrocautery was used to incise the skin and subcutaneous tissue. The lateral branch of the greater auricular nerve was preserved. The parotid gland was dissected forward off of the upper sternocleidomastoid muscle and the digastric muscle was exposed posteriorly. The gland was also dissected off of the external auditory canal. Careful dissection superior to the digastric muscle and medial to the tympanomastoid suture line revealed the main trunk of the facial nerve. Using a McCabe dissector the main trunk was dissected out towards the pes anserinus. The upper and lower divisions were then dissected identifying all branches.  The harmonic scalpel was used to divide the parotid tissue. Bipolar cautery was used as needed for completion of hemostasis. The tumor was identified in the lower part of the gland.  Tumor was oblong in shape, it was approximately 1-1/2 x 2 cm, and slightly darker than the surrounding tissue.  Is very soft  and was kept intact.  It was intimate with the lower division at the main branches and all of these branches were dissected off of the tumor.  The glandular tissue with the accompanying tumor was dissected free and removed, sent for pathologic evaluation. The wound was irrigated with saline and hemostasis was completed as needed.  There were  no other visible or palpable nodes in the parotid bed or in the upper neck.  The drain was exited through a separate stab incision and secured in place with nylon suture. A running subcuticular chromic closure was accomplished and Dermabond was used on the skin. The drain was charged. She was awakened, extubated and transferred to recovery in stable condition.    PATIENT DISPOSITION:  To PACU, stable

## 2022-08-20 NOTE — Progress Notes (Signed)
Subjective: She is doing well, some pain.  Objective: Vital signs in last 24 hours: Temp:  [97.8 F (36.6 C)-98.2 F (36.8 C)] 97.8 F (36.6 C) (03/04 1453) Pulse Rate:  [70-90] 90 (03/04 1453) Resp:  [9-20] 16 (03/04 1453) BP: (116-147)/(54-74) 116/54 (03/04 1453) SpO2:  [93 %-99 %] 94 % (03/04 1453) Weight:  [88 kg] 88 kg (03/04 0942) Wt Readings from Last 1 Encounters:  08/20/22 88 kg    Intake/Output from previous day: No intake/output data recorded. Intake/Output this shift: Total I/O In: 500 [I.V.:500] Out: 30 [Blood:30]  General appearance: alert, cooperative, and no distress Neck: left parotid incision clean and intact, no fluid collection, drain functioning, facial movement normal except for some lower lip weakness on left  Recent Labs    08/20/22 0938 08/20/22 1258  WBC 4.5  --   HGB 13.2 12.2  HCT 39.4 36.0  PLT 217  --     Recent Labs    08/20/22 0938 08/20/22 1258  NA 138 143  K 2.8* 3.2*  CL 102 104  CO2 31  --   GLUCOSE 120* 129*  BUN 15 17  CREATININE 0.87 0.80  CALCIUM 9.3  --     Medications: I have reviewed the patient's current medications.  Assessment/Plan: S/p left parotidectomy  Doing well.  Observe overnight.  Drain functioning.   LOS: 0 days   Melida Quitter 08/20/2022, 6:59 PM

## 2022-08-20 NOTE — Transfer of Care (Signed)
Immediate Anesthesia Transfer of Care Note  Patient: [name]  Procedure(s) Performed: LEFT SUPERFICIAL PAROTIDECTOMY WITH FACIAL NERVE DISSECTION (Left)  Patient Location: PACU  Anesthesia Type:General  Level of Consciousness: drowsy  Airway & Oxygen Therapy: Patient Spontanous Breathing  Post-op Assessment: Report given to RN and Post -op Vital signs reviewed and stable  Post vital signs: Reviewed and stable  Last Vitals:  Vitals Value Taken Time  BP 141/60 08/20/22 1246  Temp    Pulse 93 08/20/22 1247  Resp 14 08/20/22 1247  SpO2 91 % 08/20/22 1247  Vitals shown include unvalidated device data.  Last Pain:  Vitals:   08/20/22 1007  TempSrc:   PainSc: 0-No pain         Complications: No notable events documented.

## 2022-08-20 NOTE — Discharge Instructions (Signed)
Keep the incision clean and dry.  Empty the drain, measure the drainage, recharge the drain every 8 hours.  Resume Xarelto tonight.  Walk and keep your legs moving is much as possible.

## 2022-08-20 NOTE — Progress Notes (Signed)
Hospital out of SCD machines spoke with secretary she stated she has ordered SCD's for the patient and they will bring them when they become available

## 2022-08-20 NOTE — Anesthesia Postprocedure Evaluation (Signed)
Anesthesia Post Note  Patient: [name]  Procedure(s) Performed: LEFT SUPERFICIAL PAROTIDECTOMY WITH FACIAL NERVE DISSECTION (Left)     Patient location during evaluation: PACU Anesthesia Type: General Level of consciousness: awake Pain management: pain level controlled Vital Signs Assessment: post-procedure vital signs reviewed and stable Respiratory status: spontaneous breathing, nonlabored ventilation and respiratory function stable Cardiovascular status: blood pressure returned to baseline and stable Postop Assessment: no apparent nausea or vomiting Anesthetic complications: no   No notable events documented.  Last Vitals:  Vitals:   08/20/22 1453 08/20/22 2012  BP: (!) 116/54 (!) 115/57  Pulse: 90 96  Resp: 16   Temp: 36.6 C 36.5 C  SpO2: 94% 94%    Last Pain:  Vitals:   08/20/22 2012  TempSrc: Oral  PainSc:                  Karyl Kinnier Ubah Radke

## 2022-08-21 ENCOUNTER — Encounter (HOSPITAL_COMMUNITY): Payer: Self-pay | Admitting: Otolaryngology

## 2022-08-21 DIAGNOSIS — R221 Localized swelling, mass and lump, neck: Secondary | ICD-10-CM | POA: Diagnosis not present

## 2022-08-21 LAB — SURGICAL PATHOLOGY

## 2022-08-21 NOTE — Discharge Summary (Signed)
Physician Discharge Summary  Patient ID: [name] MRN: [id number] DOB/AGE: Feb 02, 1956 67 y.o.  Admit date: 08/20/2022 Discharge date: 08/21/2022  Admission Diagnoses: Parotid mass  Discharge Diagnoses:  Principal Problem:   Parotid mass   Discharged Condition: good  Hospital Course: No complications  Consults: none  Significant Diagnostic Studies: none  Treatments: surgery: Left superficial parotidectomy with facial nerve dissection  Discharge Exam: Blood pressure (!) 124/58, pulse 64, temperature 97.9 F (36.6 C), temperature source Oral, resp. rate 16, height '5\' 4"'$  (1.626 m), weight 88 kg, SpO2 99 %. PHYSICAL EXAM: Awake and alert.  Slight weakness of the lower division.  Incision looks excellent.  Drain is charged.  Disposition:      Follow-up Information     Izora Gala, MD Follow up on 08/23/2022.   Specialty: Otolaryngology Why: Come at 9 AM for drain removal Contact information: [address] [place] [address] [phone]                 Signed: Izora Gala 08/21/2022, 8:34 AM

## 2022-08-21 NOTE — Progress Notes (Signed)
RN gave patient DC instructions and the patient stated understanding. IV has been removed and medications have been escribed to her home pharmacy. RN taught her family member drain care and provided supplies to aid with drain care.

## 2022-09-04 ENCOUNTER — Observation Stay (HOSPITAL_COMMUNITY): Payer: BC Managed Care – PPO

## 2022-09-04 ENCOUNTER — Emergency Department (HOSPITAL_COMMUNITY): Payer: BC Managed Care – PPO

## 2022-09-04 ENCOUNTER — Observation Stay (HOSPITAL_COMMUNITY)
Admission: EM | Admit: 2022-09-04 | Discharge: 2022-09-05 | Disposition: A | Discharge status: Home / Self Care | Payer: BC Managed Care – PPO | Attending: Emergency Medicine | Admitting: Emergency Medicine

## 2022-09-04 ENCOUNTER — Encounter (HOSPITAL_COMMUNITY): Payer: Self-pay

## 2022-09-04 ENCOUNTER — Other Ambulatory Visit: Payer: Self-pay

## 2022-09-04 DIAGNOSIS — Z87891 Personal history of nicotine dependence: Secondary | ICD-10-CM | POA: Diagnosis not present

## 2022-09-04 DIAGNOSIS — Z7901 Long term (current) use of anticoagulants: Secondary | ICD-10-CM | POA: Insufficient documentation

## 2022-09-04 DIAGNOSIS — E119 Type 2 diabetes mellitus without complications: Secondary | ICD-10-CM

## 2022-09-04 DIAGNOSIS — K118 Other diseases of salivary glands: Secondary | ICD-10-CM | POA: Diagnosis present

## 2022-09-04 DIAGNOSIS — E876 Hypokalemia: Secondary | ICD-10-CM | POA: Insufficient documentation

## 2022-09-04 DIAGNOSIS — Z1152 Encounter for screening for COVID-19: Secondary | ICD-10-CM | POA: Insufficient documentation

## 2022-09-04 DIAGNOSIS — I82409 Acute embolism and thrombosis of unspecified deep veins of unspecified lower extremity: Secondary | ICD-10-CM | POA: Diagnosis present

## 2022-09-04 DIAGNOSIS — R55 Syncope and collapse: Secondary | ICD-10-CM | POA: Diagnosis not present

## 2022-09-04 DIAGNOSIS — Z86718 Personal history of other venous thrombosis and embolism: Secondary | ICD-10-CM | POA: Insufficient documentation

## 2022-09-04 DIAGNOSIS — Z7984 Long term (current) use of oral hypoglycemic drugs: Secondary | ICD-10-CM | POA: Insufficient documentation

## 2022-09-04 DIAGNOSIS — I10 Essential (primary) hypertension: Secondary | ICD-10-CM | POA: Insufficient documentation

## 2022-09-04 DIAGNOSIS — Z79899 Other long term (current) drug therapy: Secondary | ICD-10-CM | POA: Diagnosis not present

## 2022-09-04 DIAGNOSIS — E785 Hyperlipidemia, unspecified: Secondary | ICD-10-CM | POA: Diagnosis present

## 2022-09-04 DIAGNOSIS — Z96641 Presence of right artificial hip joint: Secondary | ICD-10-CM | POA: Diagnosis not present

## 2022-09-04 DIAGNOSIS — F32A Depression, unspecified: Secondary | ICD-10-CM | POA: Diagnosis not present

## 2022-09-04 LAB — BASIC METABOLIC PANEL
Anion gap: 11 (ref 5–15)
BUN: 12 mg/dL (ref 8–23)
CO2: 25 mmol/L (ref 22–32)
Calcium: 9.2 mg/dL (ref 8.9–10.3)
Chloride: 103 mmol/L (ref 98–111)
Creatinine, Ser: 0.84 mg/dL (ref 0.44–1.00)
GFR, Estimated: >60 mL/min (ref >60)
Glucose, Bld: 124 mg/dL — ABNORMAL HIGH (ref 70–99)
Potassium: 3.2 mmol/L — ABNORMAL LOW (ref 3.5–5.1)
Sodium: 139 mmol/L (ref 135–145)

## 2022-09-04 LAB — HEMOGLOBIN A1C
Hgb A1c MFr Bld: 6.2 % — ABNORMAL HIGH (ref 4.8–5.6)
Mean Plasma Glucose: 131 mg/dL

## 2022-09-04 LAB — CBC WITH DIFFERENTIAL/PLATELET
Abs Immature Granulocytes: 0.04 10*3/uL (ref 0.00–0.07)
Basophils Absolute: 0.1 10*3/uL (ref 0.0–0.1)
Basophils Relative: 1 %
Eosinophils Absolute: 0.2 10*3/uL (ref 0.0–0.5)
Eosinophils Relative: 4 %
HCT: 38.4 % (ref 36.0–46.0)
Hemoglobin: 12.5 g/dL (ref 12.0–15.0)
Immature Granulocytes: 1 %
Lymphocytes Relative: 17 %
Lymphs Abs: 1.2 10*3/uL (ref 0.7–4.0)
MCH: 29.6 pg (ref 26.0–34.0)
MCHC: 32.6 g/dL (ref 30.0–36.0)
MCV: 90.8 fL (ref 80.0–100.0)
Monocytes Absolute: 0.5 10*3/uL (ref 0.1–1.0)
Monocytes Relative: 7 %
Neutro Abs: 4.9 10*3/uL (ref 1.7–7.7)
Neutrophils Relative %: 70 %
Platelets: 221 10*3/uL (ref 150–400)
RBC: 4.23 MIL/uL (ref 3.87–5.11)
RDW: 13.6 % (ref 11.5–15.5)
WBC: 6.9 10*3/uL (ref 4.0–10.5)
nRBC: 0 % (ref 0.0–0.2)

## 2022-09-04 LAB — GLUCOSE, CAPILLARY
Glucose-Capillary: 210 mg/dL — ABNORMAL HIGH (ref 70–99)
Glucose-Capillary: 151 mg/dL — ABNORMAL HIGH (ref 70–99)

## 2022-09-04 LAB — RESP PANEL BY RT-PCR (RSV, FLU A&B, COVID)  RVPGX2
Influenza A by PCR: NEGATIVE
Influenza B by PCR: NEGATIVE
Resp Syncytial Virus by PCR: NEGATIVE
SARS Coronavirus 2 by RT PCR: POSITIVE — AB

## 2022-09-04 LAB — TROPONIN I (HIGH SENSITIVITY)
Troponin I (High Sensitivity): 6 ng/L (ref <18)
Troponin I (High Sensitivity): 5 ng/L (ref <18)

## 2022-09-04 LAB — HIV ANTIBODY (ROUTINE TESTING W REFLEX): HIV Screen 4th Generation wRfx: NONREACTIVE

## 2022-09-04 LAB — CBG MONITORING, ED: Glucose-Capillary: 118 mg/dL — ABNORMAL HIGH (ref 70–99)

## 2022-09-04 LAB — MAGNESIUM: Magnesium: 1.8 mg/dL (ref 1.7–2.4)

## 2022-09-04 MED ORDER — IRBESARTAN 75 MG PO TABS
75.0000 mg | ORAL_TABLET | Freq: Every day | ORAL | Status: DC
  Filled 2022-09-04: qty 1

## 2022-09-04 MED ORDER — ATORVASTATIN CALCIUM 40 MG PO TABS
40.0000 mg | ORAL_TABLET | Freq: Every day | ORAL | Status: DC
  Administered 2022-09-04: 40 mg via ORAL
  Filled 2022-09-04: qty 1

## 2022-09-04 MED ORDER — KETOROLAC TROMETHAMINE 30 MG/ML IJ SOLN
30.0000 mg | Freq: Once | INTRAMUSCULAR | Status: AC
  Administered 2022-09-04: 30 mg via INTRAVENOUS
  Filled 2022-09-04: qty 1

## 2022-09-04 MED ORDER — ACETAMINOPHEN 650 MG RE SUPP: 650.0000 mg | Freq: Four times a day (QID) | RECTAL | Status: DC | PRN

## 2022-09-04 MED ORDER — ACETAMINOPHEN 325 MG PO TABS
650.0000 mg | ORAL_TABLET | Freq: Four times a day (QID) | ORAL | Status: DC | PRN
  Administered 2022-09-04 – 2022-09-05 (×2): 650 mg via ORAL
  Filled 2022-09-04 (×2): qty 2

## 2022-09-04 MED ORDER — MELATONIN 5 MG PO TABS
5.0000 mg | ORAL_TABLET | Freq: Every evening | ORAL | Status: DC | PRN
  Administered 2022-09-04: 5 mg via ORAL
  Filled 2022-09-04: qty 1

## 2022-09-04 MED ORDER — POTASSIUM CHLORIDE CRYS ER 20 MEQ PO TBCR
20.0000 meq | EXTENDED_RELEASE_TABLET | Freq: Once | ORAL | Status: AC
  Administered 2022-09-04: 20 meq via ORAL
  Filled 2022-09-04: qty 1

## 2022-09-04 MED ORDER — CITALOPRAM HYDROBROMIDE 20 MG PO TABS
20.0000 mg | ORAL_TABLET | Freq: Every day | ORAL | Status: DC
  Administered 2022-09-05: 20 mg via ORAL
  Filled 2022-09-04: qty 1

## 2022-09-04 MED ORDER — RIVAROXABAN 20 MG PO TABS
20.0000 mg | ORAL_TABLET | Freq: Every day | ORAL | Status: DC
  Administered 2022-09-04 – 2022-09-05 (×2): 20 mg via ORAL
  Filled 2022-09-04 (×2): qty 1

## 2022-09-04 MED ORDER — METOPROLOL SUCCINATE ER 25 MG PO TB24
25.0000 mg | ORAL_TABLET | Freq: Every day | ORAL | Status: DC
  Administered 2022-09-05: 25 mg via ORAL
  Filled 2022-09-04: qty 1

## 2022-09-04 MED ORDER — SODIUM CHLORIDE 0.9% FLUSH
3.0000 mL | Freq: Two times a day (BID) | INTRAVENOUS | Status: DC
  Administered 2022-09-04 – 2022-09-05 (×3): 3 mL via INTRAVENOUS

## 2022-09-04 MED ORDER — SODIUM CHLORIDE 0.9 % IV BOLUS
1000.0000 mL | Freq: Once | INTRAVENOUS | Status: AC
  Administered 2022-09-04: 1000 mL via INTRAVENOUS

## 2022-09-04 MED ORDER — ACETAMINOPHEN 325 MG PO TABS
650.0000 mg | ORAL_TABLET | Freq: Once | ORAL | Status: AC
  Administered 2022-09-04: 650 mg via ORAL
  Filled 2022-09-04: qty 2

## 2022-09-04 MED ORDER — INSULIN ASPART 100 UNIT/ML IJ SOLN
0.0000 [IU] | Freq: Three times a day (TID) | INTRAMUSCULAR | Status: DC
  Administered 2022-09-04: 5 [IU] via SUBCUTANEOUS
  Administered 2022-09-05: 3 [IU] via SUBCUTANEOUS

## 2022-09-04 MED ORDER — ONDANSETRON HCL 4 MG PO TABS: 4.0000 mg | ORAL_TABLET | Freq: Four times a day (QID) | ORAL | Status: DC | PRN

## 2022-09-04 MED ORDER — HYDROCHLOROTHIAZIDE 25 MG PO TABS
25.0000 mg | ORAL_TABLET | Freq: Every day | ORAL | Status: DC
  Administered 2022-09-05: 25 mg via ORAL
  Filled 2022-09-04: qty 1

## 2022-09-04 MED ORDER — ONDANSETRON HCL 4 MG/2ML IJ SOLN: 4.0000 mg | Freq: Four times a day (QID) | INTRAMUSCULAR | Status: DC | PRN

## 2022-09-04 NOTE — H&P (Signed)
History and Physical    Patient: [name] [id number] DOB: 1956/02/27 DOA: 09/04/2022 DOS: the patient was seen and examined on 09/04/2022 PCP: Elwyn Reach, MD  Patient coming from: Home - lives with sister, [name]; : daughter, [name] [phone]    Chief Complaint: Syncope  HPI: [name] is a 67 y.o. female with medical history significant of HTN, HLD, pre-diabetes, h/o DVT on Xarelto, and recent parotidectomy on 3/4 with pathology c/w benign oncocytic neoplasm presenting with syncope. She reports that she passed out at work this morning. She has done well since parotid surgery. She went back to work yesterday, for the first time since surgery worked 9 hours. This AM, she passed out. She was normal upon awakening and doing ok. 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 She bent her knees and the next thing she knew she was on the floor. She did not hit her head. She did get dizzy at work one day several weeks to months ago - went to the doctor and all was well. She has not been feeling dizzy otherwise until today. She does not feel like her usual self and did feel quite dizzy upon sitting up in the ER. She has been very dizzy on and off since she fell this AM. She is seeing color changes - for example, the tiles on the ceiling appear to be yellow and white. She is not sure how long she was out. She did not have urinary/fecal incontinence.     ER Course:  High-risk syncope.  Passed out at work today, light-headed prior.     Review of Systems: As mentioned in the history of present illness. All other systems reviewed and are negative. Past Medical History:  Diagnosis Date   Anxiety    Colitis 08/2001   DVT (deep venous thrombosis) ([facility])    RLE 06/2016, 08/2020   Headache    has tunnel vision when she has a headache   History of blood transfusion    when twins were born.   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   Pneumonia    Pre-diabetes    Past Surgical History:  Procedure Laterality Date   ABDOMINAL HYSTERECTOMY     BREAST LUMPECTOMY     BUNIONECTOMY     C Section     PAROTIDECTOMY Left 08/20/2022   Procedure: LEFT SUPERFICIAL PAROTIDECTOMY WITH FACIAL NERVE DISSECTION;  Surgeon: Izora Gala, MD;  Location: [facility];  Service: ENT;  Laterality: Left;  RNFA   TOTAL HIP ARTHROPLASTY Right 08/30/2021   Procedure: RIGHT TOTAL HIP ARTHROPLASTY ANTERIOR APPROACH;  Surgeon: Leandrew Koyanagi, MD;  Location: [facility];  Service: Orthopedics;  Laterality: Right;   Social History:  reports that she quit smoking about 7 years ago. Her smoking use included cigarettes. She has a 42.00 pack-year smoking history. She has never used smokeless tobacco. She reports that she does not currently use alcohol. She reports that she does not use drugs.  Allergies  Allergen Reactions   Penicillins Shortness Of Breath, Swelling and Other (See Comments)    Tongue swelling    Shellfish-Derived Products Shortness Of Breath and Swelling    Tongue swelling    Morphine And Related Itching and Other (See Comments)    Hallucinations     Family History  Problem Relation Age of Onset   Lupus Mother    Parkinson's disease  Mother    Cancer Father    Cancer Sister    Cancer Brother     Prior to Admission medications   Medication Sig Start Date End Date Taking? Authorizing Provider  acetaminophen (TYLENOL) 650 MG CR tablet Take 650-1,300 mg by mouth every 8 (eight) hours as needed for pain.    [provider]  ALPRAZolam Duanne Moron) 0.5 MG tablet Take 0.25-0.5 mg by mouth daily as needed for anxiety. 06/09/22   [provider]  atorvastatin (LIPITOR) 20 MG tablet Take 40 mg by mouth at bedtime.    [provider]  citalopram (CELEXA) 20 MG tablet Take 1 tablet (20 mg total) by mouth daily. 07/26/20   Just, Laurita Quint, FNP  enoxaparin (LOVENOX) 150 MG/ML injection  Inject 150 mg into the skin 2 (two) times daily. 08/05/22   [provider]  furosemide (LASIX) 40 MG tablet Take 40 mg by mouth daily.    [provider]  hydrochlorothiazide (HYDRODIURIL) 25 MG tablet Take 1 tablet (25 mg total) by mouth daily. 07/26/20   Just, Laurita Quint, FNP  HYDROcodone-acetaminophen (NORCO) 7.5-325 MG tablet Take 1 tablet by mouth every 6 (six) hours as needed for moderate pain. 08/20/22   Izora Gala, MD  melatonin 5 MG TABS Take 5 mg by mouth at bedtime as needed (sleep).    [provider]  metFORMIN (GLUCOPHAGE) 500 MG tablet Take 500 mg by mouth 2 (two) times daily with a meal.    [provider]  metoprolol succinate (TOPROL-XL) 25 MG 24 hr tablet Take 25 mg by mouth daily. 06/22/20   [provider]  ondansetron (ZOFRAN-ODT) 4 MG disintegrating tablet Take 1 tablet (4 mg total) by mouth every 8 (eight) hours as needed for nausea or vomiting. 08/20/22   Izora Gala, MD  rivaroxaban (XARELTO) 20 MG TABS tablet Take 1 tablet (20 mg total) by mouth daily with supper. 04/19/22   Nicholas Lose, MD  valsartan (DIOVAN) 80 MG tablet Take 1 tablet (80 mg total) by mouth daily. Patient taking differently: Take 80 mg by mouth at bedtime. 07/26/20   Just, Laurita Quint, FNP    Physical Exam: Vitals:   09/04/22 1030 09/04/22 1100 09/04/22 1145 09/04/22 1319  BP: 130/67 128/63 (!) 152/76 (!) 149/66  Pulse: 73 73 79 81  Resp: 18 13 14 19   Temp:      SpO2: 98% 100% 99% 98%  Weight:      Height:       General:  Appears calm and comfortable and is in NAD Eyes:  PERRL, EOMI, normal lids, iris ENT:  grossly normal hearing, lips, mmm; she has a small ditzel on her R lateral tongue that could be trauma although it is not obviously new and there is no blood Neck:  no LAD, masses or thyromegaly Cardiovascular:  RRR, no m/r/g. No LE edema.  Respiratory:   CTA bilaterally with no wheezes/rales/rhonchi.  Normal respiratory effort. Abdomen:  soft, NT,  ND Skin:  no rash or induration seen on limited exam Musculoskeletal:  grossly normal tone BUE/BLE, good ROM, no bony abnormality Psychiatric:  blunted mood and affect, speech fluent and appropriate, AOx3 Neurologic:  CN 2-12 grossly intact, moves all extremities in coordinated fashion   Radiological Exams on Admission: Independently reviewed - see discussion in A/P where applicable  CT Head Wo Contrast  Result Date: 09/04/2022 CLINICAL DATA:  Syncope EXAM: CT HEAD WITHOUT CONTRAST TECHNIQUE: Contiguous axial images were obtained from the base of the skull  through the vertex without intravenous contrast. RADIATION DOSE REDUCTION: This exam was performed according to the departmental dose-optimization program which includes automated exposure control, adjustment of the mA and/or kV according to patient size and/or use of iterative reconstruction technique. COMPARISON:  12/01/2006 FINDINGS: Brain: No evidence of acute infarction, hemorrhage, hydrocephalus, extra-axial collection or mass lesion/mass effect. Vascular: Atherosclerotic calcifications involving the large vessels of the skull base. No unexpected hyperdense vessel. Skull: Normal. Negative for fracture or focal lesion. Sinuses/Orbits: No acute finding. Other: None. IMPRESSION: No acute intracranial abnormality. Electronically Signed   By: Davina Poke D.O.   On: 09/04/2022 09:13    EKG: Independently reviewed.  NSR with rate 75; no evidence of acute ischemia   Labs on Admission: I have personally reviewed the available labs and imaging studies at the time of the admission.  Pertinent labs:    K+ 3.2 Glucose 124 HS troponin 6, 5 Normal CBC COVID/flu/RSV pending   Assessment and Plan: Principal Problem:   Syncope and collapse Active Problems:   Depression   DVT (deep venous thrombosis) ([facility])   Parotid mass   Essential hypertension   Controlled type 2 diabetes mellitus without complication, without long-term current use of  insulin ([facility]CC)   Dyslipidemia    Syncope -Patient with recent surgery but has been doing well post-operatively -She returned to work yesterday, worked 9 hours, and had syncope at work this AM -Etiology is not clear. The differential diagnosis is broad, but more likely etiology includes vasovagal syncope, dizziness, seizure -Patients at highest risk from syncope include those with serious comorbidities; age >87; exertional syncope -This patient is at moderate/high risk for serious outcome and thus should be observed overnight on telemetry in the hospital. -Will request PT vestibular evaluation -Neuro checks  -Will order EEG due to visual disturbance and ?tongue trauma in the setting of syncope  HTN -Continue HCTZ, Toprol XL, valsartan (irbesartan per formulary)  DM -Will check A1c -hold Glucophage -Cover with moderate-scale SSI   HLD -Continue atorvastatin  H/o DVT -She reports compliance with Xarelto -No tachycardia, tachypnea, or hypoxia -Low suspicion for current clot so will not order CTA  Mood d/o -Continue citalopram  Parotidectomy -recent surgery -Healing well and without complications -Benign pathology -Unlikely related to current presentation    Advance Care Planning:   Code Status: Full Code - Code status was discussed with the patient and/or family at the time of admission.  The patient would want to receive full resuscitative measures at this time.   Consults: PT  DVT Prophylaxis: Xarelto  Family Communication: Daughter was present throughout evaluation  Severity of Illness: The appropriate patient status for this patient is OBSERVATION. Observation status is judged to be reasonable and necessary in order to provide the required intensity of service to ensure the patient's safety. The patient's presenting symptoms, physical exam findings, and initial radiographic and laboratory data in the context of their medical condition is felt to place them at  decreased risk for further clinical deterioration. Furthermore, it is anticipated that the patient will be medically stable for discharge from the hospital within 2 midnights of admission.   Author: Karmen Bongo, MD 09/04/2022 1:37 PM  For on call review [email or web address].

## 2022-09-04 NOTE — Progress Notes (Signed)
Recent left parotid surgery.  Admitted for syncopal episode.  Having some drainage from the surgical site.  Does not appear to be associated with meals directly.  On exam the facial nerve function is nearly normal.  The incision looks good with glue in place.  There is no active drainage during my evaluation but there is some clear discharge on the bandage.  No signs of infection.  This either represents salivary secretions that are collecting and leaking through the incision or the drain site, or it could be gustatory sweating which is not unusual after parotid surgery.  In any case recommend just keep a dressing on it and keep it clean and contact me if there is any pain redness or swelling to suggest infection.  She will follow-up as scheduled as an outpatient.

## 2022-09-04 NOTE — Procedures (Signed)
Patient Name: DAPHYNE MCCLAM  MRN: [id number]  Epilepsy Attending: Lora Havens  Referring Physician/Provider: Karmen Bongo, MD  Date: 09/04/2022 Duration: 21.48 mins  Patient history: 68yo F with syncope. EEG to evaluate for seizure  Level of alertness: Awake  AEDs during EEG study: None  Technical aspects: This EEG study was done with scalp electrodes positioned according to the 10-20 International system of electrode placement. Electrical activity was reviewed with band pass filter of 1-70Hz , sensitivity of 7 uV/mm, display speed of 76mm/sec with a 60Hz  notched filter applied as appropriate. EEG data were recorded continuously and digitally stored.  Video monitoring was available and reviewed as appropriate.  Description: The posterior dominant rhythm consists of 10 Hz activity of moderate voltage (25-35 uV) seen predominantly in posterior head regions, symmetric and reactive to eye opening and eye closing. Hyperventilation and photic stimulation were not performed.     IMPRESSION: This study is within normal limits. No seizures or epileptiform discharges were seen throughout the recording.  A normal interictal EEG does not exclude the diagnosis of epilepsy.  Aarnav Steagall Barbra Sarks

## 2022-09-04 NOTE — Evaluation (Signed)
Physical Therapy Vestibular Evaluation Patient Details Name: [name] MRN: [id number] DOB: 1955/10/02 Today's Date: 09/04/2022  History of Present Illness  67 y.o. female presenting with syncope.   PMH significant of HTN, HLD, pre-diabetes, h/o DVT on Xarelto, and recent parotidectomy on 3/4 with pathology c/w benign oncocytic neoplasm  Clinical Impression   Patient completed vestibular evaluation and unable to elicit the spinning sensation she reports she briefly had before she passed out. No nystagmus noted throughout. Unable to complete full mobility assessment due to EEG patiently waiting to hook-up pt to EEG. Will plan for mobility assessment 3/20 to assure no DME needs and safe to ambulate/return home.        Recommendations for follow up therapy are one component of a multi-disciplinary discharge planning process, led by the attending physician.  Recommendations may be updated based on patient status, additional functional criteria and insurance authorization.  Follow Up Recommendations Other (comment) (TBD when mobility assessment completed)      Assistance Recommended at Discharge PRN  Patient can return home with the following       Equipment Recommendations    Recommendations for Other Services       Functional Status Assessment       Precautions / Restrictions Precautions Precautions: Fall Restrictions Weight Bearing Restrictions: No    Vestibular Assessment   09/04/22 0001  Symptom Behavior  Subjective history of current problem Pt reports when she bent over to pick something up, she briefly felt spinning sensation and then knew she was going to pass out. States she has had vertigo before and this did not feel like that.  Type of Dizziness  Spinning;Lightheadedness  Frequency of Dizziness once on 3/18  Duration of Dizziness seconds, then syncope  Symptom Nature Spontaneous  Aggravating Factors Forward bending  Relieving Factors No known relieving  factors  Progression of Symptoms Better  History of similar episodes reports history of several days of vertigo for which she took meclizine and it resolved  Oculomotor Exam  Oculomotor Alignment Normal  Ocular ROM normal  Spontaneous Absent  Gaze-induced  Absent  Smooth Pursuits Intact  Saccades Intact  Vestibulo-Ocular Reflex  VOR 1 Head Only (x 1 viewing) no symptoms elicited  Auditory  Comments denies change in hearing  Positional Testing  Dix-Hallpike Dix-Hallpike Right;Dix-Hallpike Left  Dix-Hallpike Right  Dix-Hallpike Right Duration 0  Dix-Hallpike Right Symptoms No nystagmus  Dix-Hallpike Left  Dix-Hallpike Left Duration 0  Dix-Hallpike Left Symptoms No nystagmus     Mobility  Bed Mobility                    Transfers                        Ambulation/Gait                  Stairs            Wheelchair Mobility    Modified Rankin (Stroke Patients Only)       Balance                                             Pertinent Vitals/Pain      Home Living Family/patient expects to be discharged to:: Private residence  Additional Comments: *session limited by EEG arrival to hook up pt    Prior Function Prior Level of Function : Independent/Modified Independent             Mobility Comments: works at [employer] and walks with [facility]       Hand Dominance        Extremity/Trunk Assessment                Communication   Communication: No difficulties  Cognition Arousal/Alertness: Awake/alert Behavior During Therapy: WFL for tasks assessed/performed Overall Cognitive Status: Within Functional Limits for tasks assessed                                          General Comments      Exercises     Assessment/Plan    PT Assessment Patient needs continued PT services  PT Problem List Decreased activity tolerance;Decreased balance;Decreased  mobility;Decreased knowledge of use of DME       PT Treatment Interventions      PT Goals (Current goals can be found in the Care Plan section)  Acute Rehab PT Goals Patient Stated Goal: to feel better and go home    Frequency       Co-evaluation               AM-PAC PT "6 Clicks" Mobility  Outcome Measure                  End of Session     Patient left: in bed;with call bell/phone within reach;with bed alarm set;with family/visitor present;Other (comment) (EEG) Nurse Communication: Other (comment) (pt reports serous drainage from incision from parotidectomy) PT Visit Diagnosis: Dizziness and giddiness (R42)    Time: [id number] PT Time Calculation (min) (ACUTE ONLY): 21 min   Charges:   PT Evaluation $PT Eval Low Complexity: [place], PT Acute Rehabilitation Services  Office [phone]   Rexanne Mano 09/04/2022, 3:57 PM

## 2022-09-04 NOTE — ED Provider Notes (Signed)
[facility] Provider Note   CSN: [id number] Arrival date & time: 09/04/22  [id number]     History  Chief Complaint  Patient presents with   Loss of Consciousness    [name] is a 67 y.o. female with a past medical history significant for depression, history of DVT on chronic Xarelto, recent parotidectomy on 3/4 who presents to the ED after a syncopal episode.  Patient works at [employer] and notes she was bending down when she felt acutely lightheaded.  Patient notes she was able to call for help and then woke up on the ground.  Per EMS video surveillance was reviewed and patient did not hit her head.  Patient denies history of seizures.  No urinary incontinence.  No tongue biting.  Patient notes she was in her normal state of health prior to work. No recent illness. Normal po intake. She notes she typically checks her glucose in the morning which was normal and then eats a snack later in the morning.  she did not eat anything prior to syncopal episode.  Denies preceding chest pain.  Patient notes she feels "off".  Prior to surgery on 3/4 she stopped her Xarelto 5 days before and switch to Lovenox.  Patient then did not take any anticoagulants the day of surgery and restarted her Xarelto the following night.  Denies speech and visual changes.  No unilateral weakness.  History obtained from patient and past medical records. No interpreter used during encounter.       Home Medications Prior to Admission medications   Medication Sig Start Date End Date Taking? Authorizing Provider  acetaminophen (TYLENOL) 650 MG CR tablet Take 650-1,300 mg by mouth every 8 (eight) hours as needed for pain.    [provider]  ALPRAZolam Duanne Moron) 0.5 MG tablet Take 0.25-0.5 mg by mouth daily as needed for anxiety. 06/09/22   [provider]  atorvastatin (LIPITOR) 20 MG tablet Take 40 mg by mouth at bedtime.    [provider]  citalopram  (CELEXA) 20 MG tablet Take 1 tablet (20 mg total) by mouth daily. 07/26/20   Just, Laurita Quint, FNP  enoxaparin (LOVENOX) 150 MG/ML injection Inject 150 mg into the skin 2 (two) times daily. 08/05/22   [provider]  furosemide (LASIX) 40 MG tablet Take 40 mg by mouth daily.    [provider]  hydrochlorothiazide (HYDRODIURIL) 25 MG tablet Take 1 tablet (25 mg total) by mouth daily. 07/26/20   Just, Laurita Quint, FNP  HYDROcodone-acetaminophen (NORCO) 7.5-325 MG tablet Take 1 tablet by mouth every 6 (six) hours as needed for moderate pain. 08/20/22   Izora Gala, MD  melatonin 5 MG TABS Take 5 mg by mouth at bedtime as needed (sleep).    [provider]  metFORMIN (GLUCOPHAGE) 500 MG tablet Take 500 mg by mouth 2 (two) times daily with a meal.    [provider]  metoprolol succinate (TOPROL-XL) 25 MG 24 hr tablet Take 25 mg by mouth daily. 06/22/20   [provider]  ondansetron (ZOFRAN-ODT) 4 MG disintegrating tablet Take 1 tablet (4 mg total) by mouth every 8 (eight) hours as needed for nausea or vomiting. 08/20/22   Izora Gala, MD  rivaroxaban (XARELTO) 20 MG TABS tablet Take 1 tablet (20 mg total) by mouth daily with supper. 04/19/22   Nicholas Lose, MD  valsartan (DIOVAN) 80 MG tablet Take 1 tablet (80 mg total) by mouth daily. Patient taking  differently: Take 80 mg by mouth at bedtime. 07/26/20   Just, Laurita Quint, FNP      Allergies    Penicillins, Shellfish-derived products, and Morphine and related    Review of Systems   Review of Systems  Respiratory:  Negative for shortness of breath.   Cardiovascular:  Negative for chest pain.  Neurological:  Positive for syncope and light-headedness. Negative for seizures and weakness.  All other systems reviewed and are negative.   Physical Exam Updated Vital Signs BP (!) 152/76 (BP Location: Right Arm)   Pulse 79   Temp 97.6 F (36.4 C)   Resp 14   Ht 5\' 6"  (1.676 m)   Wt 81.6 kg   SpO2 99%   BMI 29.05  kg/m  Physical Exam Vitals and nursing note reviewed.  Constitutional:      General: She is not in acute distress.    Appearance: She is not ill-appearing.  HENT:     Head: Normocephalic.  Eyes:     Pupils: Pupils are equal, round, and reactive to light.  Cardiovascular:     Rate and Rhythm: Normal rate and regular rhythm.     Pulses: Normal pulses.     Heart sounds: Normal heart sounds. No murmur heard.    No friction rub. No gallop.  Pulmonary:     Effort: Pulmonary effort is normal.     Breath sounds: Normal breath sounds.  Abdominal:     General: Abdomen is flat. There is no distension.     Palpations: Abdomen is soft.     Tenderness: There is no abdominal tenderness. There is no guarding or rebound.  Musculoskeletal:        General: Normal range of motion.     Cervical back: Neck supple.  Skin:    General: Skin is warm and dry.  Neurological:     General: No focal deficit present.     Mental Status: She is alert.     Comments: Speech is clear, able to follow commands CN III-XII intact Normal strength in upper and lower extremities bilaterally including dorsiflexion and plantar flexion, strong and equal grip strength Sensation grossly intact throughout Moves extremities without ataxia, coordination intact No pronator drift  Psychiatric:        Mood and Affect: Mood normal.        Behavior: Behavior normal.     ED Results / Procedures / Treatments   Labs (all labs ordered are listed, but only abnormal results are displayed) Labs Reviewed  BASIC METABOLIC PANEL - Abnormal; Notable for the following components:      Result Value   Potassium 3.2 (*)    Glucose, Bld 124 (*)    All other components within normal limits  CBG MONITORING, ED - Abnormal; Notable for the following components:   Glucose-Capillary 118 (*)    All other components within normal limits  RESP PANEL BY RT-PCR (RSV, FLU A&B, COVID)  [id number]  CBC WITH DIFFERENTIAL/PLATELET  MAGNESIUM   TROPONIN I (HIGH SENSITIVITY)  TROPONIN I (HIGH SENSITIVITY)    EKG EKG Interpretation  Date/Time:  Tuesday September 04 2022 08:14:43 EDT Ventricular Rate:  75 PR Interval:  160 QRS Duration: 84 QT Interval:  421 QTC Calculation: 471 R Axis:   68 Text Interpretation: Sinus rhythm Abnormal R-wave progression, early transition No significant change since last tracing Confirmed by Isla Pence [phone]) on 09/04/2022 8:19:47 AM  Radiology CT Head Wo Contrast  Result Date: 09/04/2022 CLINICAL DATA:  Syncope EXAM:  CT HEAD WITHOUT CONTRAST TECHNIQUE: Contiguous axial images were obtained from the base of the skull through the vertex without intravenous contrast. RADIATION DOSE REDUCTION: This exam was performed according to the departmental dose-optimization program which includes automated exposure control, adjustment of the mA and/or kV according to patient size and/or use of iterative reconstruction technique. COMPARISON:  12/01/2006 FINDINGS: Brain: No evidence of acute infarction, hemorrhage, hydrocephalus, extra-axial collection or mass lesion/mass effect. Vascular: Atherosclerotic calcifications involving the large vessels of the skull base. No unexpected hyperdense vessel. Skull: Normal. Negative for fracture or focal lesion. Sinuses/Orbits: No acute finding. Other: None. IMPRESSION: No acute intracranial abnormality. Electronically Signed   By: Davina Poke D.O.   On: 09/04/2022 09:13    Procedures Procedures    Medications Ordered in ED Medications  sodium chloride 0.9 % bolus 1,000 mL (1,000 mLs Intravenous New Bag/Given 09/04/22 0843)  acetaminophen (TYLENOL) tablet 650 mg (650 mg Oral Given 09/04/22 0957)  potassium chloride SA (KLOR-CON M) CR tablet 20 mEq (20 mEq Oral Given 09/04/22 0957)    ED Course/ Medical Decision Making/ A&P Clinical Course as of 09/04/22 [place] Sep 04, 2022  0843 Glucose-Capillary(!): 118 [CA]  0949 Potassium(!): 3.2 [CA]    Clinical Course  User Index [CA] Suzy Bouchard, PA-C                             Medical Decision Making Amount and/or Complexity of Data Reviewed Labs: ordered. Decision-making details documented in ED Course. Radiology: ordered and independent interpretation performed. Decision-making details documented in ED Course. ECG/medicine tests: ordered and independent interpretation performed. Decision-making details documented in ED Course.  Risk OTC drugs. Prescription drug management. Decision regarding hospitalization.   This patient presents to the ED for concern of syncope, this involves an extensive number of treatment options, and is a complaint that carries with it a high risk of complications and morbidity.  The differential diagnosis includes dehydration, CVA, PE, orthostatic hypotension, viral process, etc  67 year old female presents to the ED after a syncopal episode.  Patient was bending down at work when she felt lightheaded and woke up on the ground.  Currently on Xarelto.  Recent parotidectomy on 3/4.  No chest pain or shortness of breath.  Patient notes she feels "off".  Per video surveillance patient did not hit her head. No convulsions. No history of seizures. Upon arrival, patient afebrile, not tachycardic or hypoxic.  Patient in no acute distress.  Reassuring physical exam.  Normal neurological exam without any neurological deficits.  Routine labs ordered to rule out electrolyte abnormalities.  Troponin and EKG to rule out ACS.  Orthostatic vitals.  IV fluids given.  CT head to rule out bleed.  CBC unremarkable.  No leukocytosis.  Normal hemoglobin.  BMP significant for hypokalemia 3.2.  Potassium repleted here in the ED.  Normal renal function.  Hyperglycemia 124.  No anion gap.  Doubt DKA.  Troponin normal.  EKG demonstrates normal sinus rhythm.  No signs of acute ischemia.  Low suspicion for ACS.  CT head personally reviewed and interpreted which negative for any acute intracranial  abnormalities.  Orthostatic vitals normal however, patient unable to stand for 3 minutes. Presentation does not sound like a seizure. Unknown etiology of syncope.    11:19 AM reassessed patient at bedside.  Patient admits to still feeling "off".  Patient notes she had difficulties walking to the bathroom.  Will consult hospitalist for admission for  further syncope workup given patient is high risk. Discussed with Dr. Gilford Raid who agrees with assessment and plan.   11:52 AM Discussed with Dr. Lorin Mercy with [facility] who agrees to admit patient.   Has PCP Hx DVT on Xarelto        Final Clinical Impression(s) / ED Diagnoses Final diagnoses:  Syncope and collapse    Rx / DC Orders ED Discharge Orders     None         Karie Kirks 09/04/22 1155    Isla Pence, MD 09/04/22 [phone]

## 2022-09-04 NOTE — ED Triage Notes (Signed)
Pt to the ed from work with a CC of syncope. Pt relays he started feeling dizzy so she lowered herself to the ground. Pt is on a blood thinner, denies fall. Pt has LOC for a mins or so. Denies current cp.

## 2022-09-04 NOTE — Progress Notes (Signed)
EEG complete - results pending 

## 2022-09-04 NOTE — ED Notes (Signed)
ED TO INPATIENT HANDOFF REPORT  ED Nurse Name and Phone #: [phone]  S Name/Age/Gender [name] 67 y.o. female Room/Bed: 029C/029C  Code Status   Code Status: Prior  Home/SNF/Other Home Patient oriented to: self, place, time, and situation Is this baseline? Yes   Triage Complete: Triage complete  Chief Complaint Syncope and collapse [R55]  Triage Note Pt to the ed from work with a CC of syncope. Pt relays he started feeling dizzy so she lowered herself to the ground. Pt is on a blood thinner, denies fall. Pt has LOC for a mins or so. Denies current cp.    Allergies Allergies  Allergen Reactions   Penicillins Shortness Of Breath, Swelling and Other (See Comments)    Tongue swelling    Shellfish-Derived Products Shortness Of Breath and Swelling    Tongue swelling    Morphine And Related Itching and Other (See Comments)    Hallucinations     Level of Care/Admitting Diagnosis ED Disposition     ED Disposition  Admit   Condition  --   Comment  Hospital Area: [facility] [[id number]]  Level of Care: Telemetry Medical [104]  May place patient in observation at [facility] or [place] if equivalent level of care is available:: No  Covid Evaluation: Asymptomatic - no recent exposure (last 10 days) testing not required  Diagnosis: Syncope and collapse [780.2.ICD-9-CM]  Admitting Physician: Karmen Bongo [[id number]]  Attending Physician: Karmen Bongo [[id number]]          B Medical/Surgery History Past Medical History:  Diagnosis Date   Anxiety    Cancer ([place])    Colitis 08/2001   DVT (deep venous thrombosis) ([place])    RLE 06/2016, 08/2020   Headache    has tunnel vision when she has a headache   History of blood transfusion    when twins were born.   History of kidney stones    Hypercholesteremia    Hypertension    Pneumonia    Pre-diabetes    Past Surgical History:  Procedure Laterality Date   ABDOMINAL HYSTERECTOMY     BREAST  LUMPECTOMY     BUNIONECTOMY     C Section     PAROTIDECTOMY Left 08/20/2022   Procedure: LEFT SUPERFICIAL PAROTIDECTOMY WITH FACIAL NERVE DISSECTION;  Surgeon: Izora Gala, MD;  Location: [facility];  Service: ENT;  Laterality: Left;  RNFA   TOTAL HIP ARTHROPLASTY Right 08/30/2021   Procedure: RIGHT TOTAL HIP ARTHROPLASTY ANTERIOR APPROACH;  Surgeon: Leandrew Koyanagi, MD;  Location: [facility];  Service: Orthopedics;  Laterality: Right;     A IV Location/Drains/Wounds Patient Lines/Drains/Airways Status     Active Line/Drains/Airways     Name Placement date Placement time Site Days   Peripheral IV 08/20/22 18 G Posterior;Right Hand 08/20/22  0030  Hand  15   Peripheral IV 09/04/22 20 G Anterior;Left;Proximal Forearm 09/04/22  0843  Forearm  less than 1   Closed System Drain 1 Left;Ventral Neck Bulb (JP) 7 Fr. 08/20/22  1149  Neck  15            Intake/Output Last 24 hours No intake or output data in the 24 hours ending 09/04/22 1225  Labs/Imaging Results for orders placed or performed during the hospital encounter of 09/04/22 (from the past 48 hour(s))  Basic metabolic panel     Status: Abnormal   Collection Time: 09/04/22  8:19 AM  Result Value Ref Range   Sodium 139 135 - 145  mmol/L   Potassium 3.2 (L) 3.5 - 5.1 mmol/L   Chloride 103 98 - 111 mmol/L   CO2 25 22 - 32 mmol/L   Glucose, Bld 124 (H) 70 - 99 mg/dL    Comment: Glucose reference range applies only to samples taken after fasting for at least 8 hours.   BUN 12 8 - 23 mg/dL   Creatinine, Ser 0.84 0.44 - 1.00 mg/dL   Calcium 9.2 8.9 - 10.3 mg/dL   GFR, Estimated >60 >60 mL/min    Comment: (NOTE) Calculated using the CKD-EPI Creatinine Equation (2021)    Anion gap 11 5 - 15    Comment: Performed at [facility] [address], [place], [place] [address]  CBC WITH DIFFERENTIAL     Status: None   Collection Time: 09/04/22  8:19 AM  Result Value Ref Range   WBC 6.9 4.0 - 10.5 K/uL   RBC 4.23 3.87 - 5.11 MIL/uL    Hemoglobin 12.5 12.0 - 15.0 g/dL   HCT 38.4 36.0 - 46.0 %   MCV 90.8 80.0 - 100.0 fL   MCH 29.6 26.0 - 34.0 pg   MCHC 32.6 30.0 - 36.0 g/dL   RDW 13.6 11.5 - 15.5 %   Platelets 221 150 - 400 K/uL   nRBC 0.0 0.0 - 0.2 %   Neutrophils Relative % 70 %   Neutro Abs 4.9 1.7 - 7.7 K/uL   Lymphocytes Relative 17 %   Lymphs Abs 1.2 0.7 - 4.0 K/uL   Monocytes Relative 7 %   Monocytes Absolute 0.5 0.1 - 1.0 K/uL   Eosinophils Relative 4 %   Eosinophils Absolute 0.2 0.0 - 0.5 K/uL   Basophils Relative 1 %   Basophils Absolute 0.1 0.0 - 0.1 K/uL   Immature Granulocytes 1 %   Abs Immature Granulocytes 0.04 0.00 - 0.07 K/uL    Comment: Performed at [facility], [address], [place], [place] [address]  Troponin I (High Sensitivity)     Status: None   Collection Time: 09/04/22  8:19 AM  Result Value Ref Range   Troponin I (High Sensitivity) 6 <18 ng/L    Comment: (NOTE) Elevated high sensitivity troponin I (hsTnI) values and significant  changes across serial measurements may suggest ACS but many other  chronic and acute conditions are known to elevate hsTnI results.  Refer to the "Links" section for chest pain algorithms and additional  guidance. Performed at [facility], [place] [address], [place], [place] [address]   Magnesium     Status: None   Collection Time: 09/04/22  8:19 AM  Result Value Ref Range   Magnesium 1.8 1.7 - 2.4 mg/dL    Comment: Performed at [facility] [address] [place],  [address]  CBG monitoring, ED     Status: Abnormal   Collection Time: 09/04/22  8:34 AM  Result Value Ref Range   Glucose-Capillary 118 (H) 70 - 99 mg/dL    Comment: Glucose reference range applies only to samples taken after fasting for at least 8 hours.   Comment 1 Notify RN    Comment 2 Document in Chart   Troponin I (High Sensitivity)     Status: None   Collection Time: 09/04/22 10:44 AM  Result Value Ref Range   Troponin I (High Sensitivity) 5 <18 ng/L     Comment: (NOTE) Elevated high sensitivity troponin I (hsTnI) values and significant  changes across serial measurements may  suggest ACS but many other  chronic and acute conditions are known to elevate hsTnI results.  Refer to the "Links" section for chest pain algorithms and additional  guidance. Performed at [facility], [place] [address] [place], [place] [address]    CT Head Wo Contrast  Result Date: 09/04/2022 CLINICAL DATA:  Syncope EXAM: CT HEAD WITHOUT CONTRAST TECHNIQUE: Contiguous axial images were obtained from the base of the skull through the vertex without intravenous contrast. RADIATION DOSE REDUCTION: This exam was performed according to the departmental dose-optimization program which includes automated exposure control, adjustment of the mA and/or kV according to patient size and/or use of iterative reconstruction technique. COMPARISON:  12/01/2006 FINDINGS: Brain: No evidence of acute infarction, hemorrhage, hydrocephalus, extra-axial collection or mass lesion/mass effect. Vascular: Atherosclerotic calcifications involving the large vessels of the skull base. No unexpected hyperdense vessel. Skull: Normal. Negative for fracture or focal lesion. Sinuses/Orbits: No acute finding. Other: None. IMPRESSION: No acute intracranial abnormality. Electronically Signed   By: Davina Poke D.O.   On: 09/04/2022 09:13    Pending Labs Unresulted Labs (From admission, onward)     Start     Ordered   09/04/22 1124  Resp panel by RT-PCR (RSV, Flu A&B, Covid) Anterior Nasal Swab  (Resp panel by RT-PCR (RSV, Flu A&B, Covid))  Once,   URGENT        09/04/22 1123            Vitals/Pain Today's Vitals   09/04/22 0930 09/04/22 1030 09/04/22 1100 09/04/22 1145  BP: (!) 141/68 130/67 128/63 (!) 152/76  Pulse: 72 73 73 79  Resp: 16 18 13 14   Temp:      SpO2: 100% 98% 100% 99%  Weight:      Height:      PainSc:        Isolation Precautions Airborne and Contact  precautions  Medications Medications  ketorolac (TORADOL) 30 MG/ML injection 30 mg (has no administration in time range)  sodium chloride 0.9 % bolus 1,000 mL (1,000 mLs Intravenous New Bag/Given 09/04/22 0843)  acetaminophen (TYLENOL) tablet 650 mg (650 mg Oral Given 09/04/22 0957)  potassium chloride SA (KLOR-CON M) CR tablet 20 mEq (20 mEq Oral Given 09/04/22 0957)    Mobility walks     Focused Assessments Cardiac Assessment Handoff:    Lab Results  Component Value Date   CKTOTAL 110 07/26/2020   CKMB 1.2 09/17/2008   TROPONINI <0.03 07/22/2016   Lab Results  Component Value Date   DDIMER 0.55 (H) 07/26/2020   Does the Patient currently have chest pain? Yes    R Recommendations: See Admitting Provider Note  Report given to:   Additional Notes:

## 2022-09-05 ENCOUNTER — Observation Stay (HOSPITAL_BASED_OUTPATIENT_CLINIC_OR_DEPARTMENT_OTHER): Payer: BC Managed Care – PPO

## 2022-09-05 DIAGNOSIS — I82401 Acute embolism and thrombosis of unspecified deep veins of right lower extremity: Secondary | ICD-10-CM | POA: Diagnosis not present

## 2022-09-05 DIAGNOSIS — F3289 Other specified depressive episodes: Secondary | ICD-10-CM | POA: Diagnosis not present

## 2022-09-05 DIAGNOSIS — R55 Syncope and collapse: Secondary | ICD-10-CM

## 2022-09-05 DIAGNOSIS — K118 Other diseases of salivary glands: Secondary | ICD-10-CM

## 2022-09-05 DIAGNOSIS — E785 Hyperlipidemia, unspecified: Secondary | ICD-10-CM

## 2022-09-05 DIAGNOSIS — E119 Type 2 diabetes mellitus without complications: Secondary | ICD-10-CM | POA: Diagnosis not present

## 2022-09-05 DIAGNOSIS — I10 Essential (primary) hypertension: Secondary | ICD-10-CM

## 2022-09-05 LAB — BASIC METABOLIC PANEL
Anion gap: 10 (ref 5–15)
BUN: 13 mg/dL (ref 8–23)
CO2: 27 mmol/L (ref 22–32)
Calcium: 9.3 mg/dL (ref 8.9–10.3)
Chloride: 105 mmol/L (ref 98–111)
Creatinine, Ser: 0.87 mg/dL (ref 0.44–1.00)
GFR, Estimated: >60 mL/min (ref >60)
Glucose, Bld: 119 mg/dL — ABNORMAL HIGH (ref 70–99)
Potassium: 3.2 mmol/L — ABNORMAL LOW (ref 3.5–5.1)
Sodium: 142 mmol/L (ref 135–145)

## 2022-09-05 LAB — ECHOCARDIOGRAM COMPLETE
Area-P 1/2: 4.17 cm2
Calc EF: 69.1 %
Height: 66 in
S' Lateral: 2.5 cm
Single Plane A2C EF: 70 %
Single Plane A4C EF: 70.1 %
Weight: 3086.44 oz

## 2022-09-05 LAB — CBC
HCT: 32 % — ABNORMAL LOW (ref 36.0–46.0)
Hemoglobin: 10.6 g/dL — ABNORMAL LOW (ref 12.0–15.0)
MCH: 29.4 pg (ref 26.0–34.0)
MCHC: 33.1 g/dL (ref 30.0–36.0)
MCV: 88.9 fL (ref 80.0–100.0)
Platelets: 204 10*3/uL (ref 150–400)
RBC: 3.6 MIL/uL — ABNORMAL LOW (ref 3.87–5.11)
RDW: 13.3 % (ref 11.5–15.5)
WBC: 5.2 10*3/uL (ref 4.0–10.5)
nRBC: 0 % (ref 0.0–0.2)

## 2022-09-05 LAB — GLUCOSE, CAPILLARY
Glucose-Capillary: 116 mg/dL — ABNORMAL HIGH (ref 70–99)
Glucose-Capillary: 115 mg/dL — ABNORMAL HIGH (ref 70–99)
Glucose-Capillary: 154 mg/dL — ABNORMAL HIGH (ref 70–99)

## 2022-09-05 MED ORDER — POTASSIUM CHLORIDE CRYS ER 20 MEQ PO TBCR
40.0000 meq | EXTENDED_RELEASE_TABLET | Freq: Once | ORAL | Status: AC
  Administered 2022-09-05: 40 meq via ORAL
  Filled 2022-09-05: qty 2

## 2022-09-05 MED ORDER — ALPRAZOLAM 0.25 MG PO TABS
0.2500 mg | ORAL_TABLET | Freq: Once | ORAL | Status: AC
  Administered 2022-09-05: 0.25 mg via ORAL
  Filled 2022-09-05: qty 1

## 2022-09-05 NOTE — Plan of Care (Signed)
  Problem: Education: Goal: Knowledge of the prescribed therapeutic regimen will improve Outcome: Progressing   Problem: Activity: Goal: Ability to tolerate increased activity will improve Outcome: Progressing   Problem: Health Behavior/Discharge Planning: Goal: Identification of resources available to assist in meeting health care needs will improve Outcome: Progressing   Problem: Nutrition: Goal: Maintenance of adequate nutrition will improve Outcome: Progressing   Problem: Clinical Measurements: Goal: Complications related to the disease process, condition or treatment will be avoided or minimized Outcome: Progressing   Problem: Respiratory: Goal: Will regain and/or maintain adequate ventilation Outcome: Progressing   Problem: Skin Integrity: Goal: Demonstration of wound healing without infection will improve Outcome: Progressing   Problem: Education: Goal: Knowledge of General Education information will improve Description: Including pain rating scale, medication(s)/side effects and non-pharmacologic comfort measures Outcome: Progressing   Problem: Health Behavior/Discharge Planning: Goal: Ability to manage health-related needs will improve Outcome: Progressing   Problem: Clinical Measurements: Goal: Ability to maintain clinical measurements within normal limits will improve Outcome: Progressing Goal: Will remain free from infection Outcome: Progressing Goal: Diagnostic test results will improve Outcome: Progressing Goal: Respiratory complications will improve Outcome: Progressing Goal: Cardiovascular complication will be avoided Outcome: Progressing   Problem: Activity: Goal: Risk for activity intolerance will decrease Outcome: Progressing   Problem: Nutrition: Goal: Adequate nutrition will be maintained Outcome: Progressing   Problem: Coping: Goal: Level of anxiety will decrease Outcome: Progressing   Problem: Elimination: Goal: Will not experience  complications related to bowel motility Outcome: Progressing Goal: Will not experience complications related to urinary retention Outcome: Progressing   Problem: Pain Managment: Goal: General experience of comfort will improve Outcome: Progressing   Problem: Safety: Goal: Ability to remain free from injury will improve Outcome: Progressing   Problem: Skin Integrity: Goal: Risk for impaired skin integrity will decrease Outcome: Progressing   Problem: Education: Goal: Knowledge of condition and prescribed therapy will improve Outcome: Progressing   Problem: Cardiac: Goal: Will achieve and/or maintain adequate cardiac output Outcome: Progressing   Problem: Physical Regulation: Goal: Complications related to the disease process, condition or treatment will be avoided or minimized Outcome: Progressing   Problem: Education: Goal: Ability to describe self-care measures that may prevent or decrease complications (Diabetes Survival Skills Education) will improve Outcome: Progressing Goal: Individualized Educational Video(s) Outcome: Progressing   Problem: Coping: Goal: Ability to adjust to condition or change in health will improve Outcome: Progressing   Problem: Fluid Volume: Goal: Ability to maintain a balanced intake and output will improve Outcome: Progressing   Problem: Health Behavior/Discharge Planning: Goal: Ability to identify and utilize available resources and services will improve Outcome: Progressing Goal: Ability to manage health-related needs will improve Outcome: Progressing   Problem: Metabolic: Goal: Ability to maintain appropriate glucose levels will improve Outcome: Progressing   Problem: Nutritional: Goal: Maintenance of adequate nutrition will improve Outcome: Progressing Goal: Progress toward achieving an optimal weight will improve Outcome: Progressing   Problem: Skin Integrity: Goal: Risk for impaired skin integrity will decrease Outcome:  Progressing   Problem: Tissue Perfusion: Goal: Adequacy of tissue perfusion will improve Outcome: Progressing

## 2022-09-05 NOTE — Care Management Obs Status (Deleted)
[facility] NOTIFICATION   Patient Details  Name: [name] MRN: [id number] Date of Birth: [birth year]   Medicare Observation Status Notification Given:  Yes    Levonne Lapping, RN 09/05/2022, 3:34 PM

## 2022-09-05 NOTE — TOC Transition Note (Signed)
Transition of Care [facility]) - CM/SW Discharge Note   Patient Details  Name: KALAIYAH WINEMAN MRN: [id number] Date of Birth: 09-02-1955  [facility]) CM/SW Contact:  Levonne Lapping, RN Phone Number: 09/05/2022, 4:34 PM   Clinical Narrative:     Patient to DC to home with Family . Outpatient PT has been recommended and referral has been made to [facility] on [address]   Entered into [employer] walker recommended and provided by [employer]. Entered into AVS   No additional TOC needs           Patient Goals and CMS Choice      Discharge Placement                         Discharge Plan and Services Additional resources added to the After Visit Summary for                                       Social Determinants of Health (SDOH) Interventions SDOH Screenings   Depression (PHQ2-9): Low Risk  (08/31/2020)  Tobacco Use: Medium Risk (09/04/2022)     Readmission Risk Interventions     No data to display

## 2022-09-05 NOTE — Progress Notes (Signed)
   09/05/22 1103  Orthostatic Lying   BP- Lying 133/74  Pulse- Lying 89  Orthostatic Sitting  BP- Sitting 131/72  Pulse- Sitting 88  Orthostatic Standing at 0 minutes  BP- Standing at 0 minutes 125/73  Pulse- Standing at 0 minutes 89  Orthostatic Standing at 3 minutes  BP- Standing at 3 minutes 136/79

## 2022-09-05 NOTE — Progress Notes (Signed)
Physical Therapy Treatment Patient Details Name: [name] MRN: [id number] DOB: 26-Feb-1956 Today's Date: 09/05/2022   History of Present Illness 67 y.o. female presenting with syncope on 3/19.   PMH significant of HTN, HLD, pre-diabetes, h/o DVT on Xarelto, and recent parotidectomy on 3/4 with pathology c/w benign oncocytic neoplasm    PT Comments    Pt tolerated today's session well, demonstrating cautious gait with mild imbalance without use of AD, improving with RW but continued with slow gait. Pt reports "shakiness" upon standing, BP remaining stable, dizziness causing admission not provoked during today's session but pt doesn't feel at her baseline, reports symptoms aren't spinning but everything feels "wobbly". MinG to supervision with use of RW for all mobility today. Recommending OPPT for progression of balance and mobility pending progression during admission, as pt is wanting to get back to work. If symptoms persist pt may benefit from HHPT. Acute PT will continue to follow up with pt as appropriate to progress mobility.     Recommendations for follow up therapy are one component of a multi-disciplinary discharge planning process, led by the attending physician.  Recommendations may be updated based on patient status, additional functional criteria and insurance authorization.  Follow Up Recommendations  Outpatient PT (vs HHPT pending progression/symptom resolution)     Assistance Recommended at Discharge Intermittent Supervision/Assistance  Patient can return home with the following A little help with walking and/or transfers;Help with stairs or ramp for entrance;Assist for transportation;Assistance with cooking/housework   Equipment Recommendations  Rolling walker (2 wheels)    Recommendations for Other Services       Precautions / Restrictions Precautions Precautions: Fall;Other (comment) Precaution Comments: COVID+ Restrictions Weight Bearing Restrictions: No      Mobility  Bed Mobility Overal bed mobility: Needs Assistance Bed Mobility: Supine to Sit, Sit to Supine     Supine to sit: Supervision, HOB elevated Sit to supine: Supervision, HOB elevated   General bed mobility comments: use of bed rail and increased time, supervision for line management and safety    Transfers Overall transfer level: Needs assistance Equipment used: Rolling walker (2 wheels) Transfers: Sit to/from Stand Sit to Stand: Min guard           General transfer comment: minG for safety, performing from bed and from toilet with use of grab bar    Ambulation/Gait Ambulation/Gait assistance: Min guard Gait Distance (Feet): 40 Feet (x15 feet without AD) Assistive device: Rolling walker (2 wheels) Gait Pattern/deviations: Step-through pattern, Decreased stride length, Wide base of support Gait velocity: decreased     General Gait Details: slow, cautious gait with mild imbalance without use of AD, gait speed improving with use of RW and smoothness of pattern   Stairs             Wheelchair Mobility    Modified Rankin (Stroke Patients Only)       Balance Overall balance assessment: Needs assistance Sitting-balance support: No upper extremity supported, Feet supported Sitting balance-Leahy Scale: Fair     Standing balance support: Bilateral upper extremity supported, During functional activity Standing balance-Leahy Scale: Poor Standing balance comment: ambulating without AD originally but slow cautious gait with imbalance, improving with use of RW, able to stand statically without AD                            Cognition Arousal/Alertness: Awake/alert Behavior During Therapy: WFL for tasks assessed/performed Overall Cognitive Status: Within Functional Limits for tasks  assessed                                 General Comments: A&Ox4, pleasant throughout session and responding appropriately        Exercises       General Comments General comments (skin integrity, edema, etc.): VSS on room air, reports mild dizziness/shakiness with standing, BP remaining 130s/70s in standing      Pertinent Vitals/Pain Pain Assessment Pain Assessment: 0-10 Pain Score: 3  Pain Location: head Pain Descriptors / Indicators: Aching Pain Intervention(s): Limited activity within patient's tolerance, Monitored during session    Home Living                          Prior Function            PT Goals (current goals can now be found in the care plan section) Acute Rehab PT Goals Patient Stated Goal: to feel better and go home Progress towards PT goals: Progressing toward goals    Frequency    Min 3X/week      PT Plan Current plan remains appropriate    Co-evaluation              AM-PAC PT "6 Clicks" Mobility   Outcome Measure  Help needed turning from your back to your side while in a flat bed without using bedrails?: None Help needed moving from lying on your back to sitting on the side of a flat bed without using bedrails?: A Little Help needed moving to and from a bed to a chair (including a wheelchair)?: A Little Help needed standing up from a chair using your arms (e.g., wheelchair or bedside chair)?: A Little Help needed to walk in hospital room?: A Little Help needed climbing 3-5 steps with a railing? : A Lot 6 Click Score: 18    End of Session Equipment Utilized During Treatment: Gait belt Activity Tolerance: Patient tolerated treatment well Patient left: in bed;with call bell/phone within reach;with nursing/sitter in room Nurse Communication: Mobility status PT Visit Diagnosis: Other abnormalities of gait and mobility (R26.89);Unsteadiness on feet (R26.81);Dizziness and giddiness (R42)     Time: [id number] PT Time Calculation (min) (ACUTE ONLY): 26 min  Charges:  $Gait Training: 8-22 mins $Therapeutic Activity: 8-22 mins                     Charlynne Cousins, PT DPT Acute  Rehabilitation Services Office [phone]    [name] 09/05/2022, 2:49 PM

## 2022-09-05 NOTE — Progress Notes (Signed)
Echocardiogram 2D Echocardiogram has been performed.  [name] 09/05/2022, 11:08 AM

## 2022-09-05 NOTE — Plan of Care (Signed)
  Problem: Activity: Goal: Ability to tolerate increased activity will improve Outcome: Progressing   Problem: Respiratory: Goal: Will regain and/or maintain adequate ventilation Outcome: Progressing   Problem: Clinical Measurements: Goal: Ability to maintain clinical measurements within normal limits will improve Outcome: Progressing Goal: Will remain free from infection Outcome: Progressing   Problem: Clinical Measurements: Goal: Will remain free from infection Outcome: Progressing   Problem: Coping: Goal: Level of anxiety will decrease Outcome: Progressing   Problem: Pain Managment: Goal: General experience of comfort will improve Outcome: Progressing   Problem: Nutritional: Goal: Maintenance of adequate nutrition will improve Outcome: Progressing   Problem: Skin Integrity: Goal: Risk for impaired skin integrity will decrease Outcome: Progressing

## 2022-09-05 NOTE — Discharge Summary (Signed)
Physician Discharge Summary  [name] [id number] DOB: [birth year] DOA: 09/04/2022  PCP: Elwyn Reach, MD  Admit date: 09/04/2022 Discharge date: 09/05/2022  Admitted From: Home  Discharge disposition: Home  Recommendations for Outpatient Follow-Up:   Follow up with your primary care provider in one week.  Check CBC, BMP, magnesium in the next visit  Discharge Diagnosis:   Principal Problem:   Syncope and collapse Active Problems:   Depression   DVT (deep venous thrombosis) (HCC)   Parotid mass   Essential hypertension   Controlled type 2 diabetes mellitus without complication, without long-term current use of insulin ([place])   Dyslipidemia   Discharge Condition: Improved.  Diet recommendation: Low sodium, heart healthy.    Wound care: None.  Code status: Full.   History of Present Illness:   [name] is a 67 y.o. female with past medical history significant of hypertension, hyperlipidemia, prediabetes, history of DVT on Xarelto and recent parotidectomy on 3/4 with pathology c/w benign oncocytic neoplasm presented to the hospital with syncope.  Patient had been down to clean something and found herself  on the floor.  Was dizzy as well with some lightheadedness prior to the event.  Patient stated that she had been going through a lot of stressors recently including her family member having recent diagnosis of cancer.  Hospital Course:   Following conditions were addressed during hospitalization as listed below,   Syncope Likely vasovagal.  Patient did not have postural hypotension.    ENT and physical therapy on board.  EEG did not show any evidence of seizure-like activity.  At this time patient has felt better.  The echocardiogram showed preserved LV function.  Essential hypertension Patient is on HCTZ, Lasix Toprol XL, valsartan (irbesartan per formulary) patient was advised orthostatic precautions and will discontinue Lasix since she is  already on HCTZ as well.  Mild hypokalemia.  Will replenish orally prior to discharge.   DM type II Latest hemoglobin A1c of 6.2.  Continue metformin from home.   Hyperlipidemia -Continue Lipitor.   H/o DVT On Xarelto and is compliant.  Continue   Mood d/o -Continue citalopram   Parotidectomy Healing well.  Seen by ENT   Disposition.  At this time, patient is stable for disposition home with outpatient PCP follow-up.  Medical Consultants:   None.  Procedures:    None Subjective:   Today, patient was seen and examined at bedside.  Patient feels a little anxious.  Denies any dizziness, lightheadedness.  Discharge Exam:   Vitals:   09/05/22 1239 09/05/22 1501  BP: 134/67 128/63  Pulse: 67 71  Resp: 15 18  Temp: 98.4 F (36.9 C)   SpO2: 94% 92%   Vitals:   09/05/22 0500 09/05/22 0818 09/05/22 1239 09/05/22 1501  BP:  (!) 146/71 134/67 128/63  Pulse:  79 67 71  Resp:  19 15 18   Temp:  97.8 F (36.6 C) 98.4 F (36.9 C)   TempSrc:  Oral Oral Oral  SpO2:  97% 94% 92%  Weight: 87.5 kg     Height:        General: Alert awake, not in obvious distress, mildly anxious. HENT: pupils equally reacting to light,  No scleral pallor or icterus noted. Oral mucosa is moist.  Chest:  Clear breath sounds.  Diminished breath sounds bilaterally. No crackles or wheezes.  CVS: S1 &S2 heard. No murmur.  Regular rate and rhythm. Abdomen: Soft, nontender, nondistended.  Bowel sounds are heard.  Extremities: No cyanosis, clubbing or edema.  Peripheral pulses are palpable. Psych: Alert, awake and oriented, mildly anxious. CNS:  No cranial nerve deficits.  Power equal in all extremities.   Skin: Warm and dry.  No rashes noted.  The results of significant diagnostics from this hospitalization (including imaging, microbiology, ancillary and laboratory) are listed below for reference.     Diagnostic Studies:   ECHOCARDIOGRAM COMPLETE  Result Date: 09/05/2022    ECHOCARDIOGRAM  REPORT   Patient Name:   [name] Date of Exam: 09/05/2022 Medical Rec #:  [id number]        Height:       66.0 in Accession #:    [id number]       Weight:       192.9 lb Date of Birth:  10-06-1955        BSA:          1.969 m Patient Age:    13 years         BP:           146/71 mmHg Patient Gender: F                HR:           57 bpm. Exam Location:  Inpatient Procedure: 2D Echo, Cardiac Doppler and Color Doppler Indications:    syncope  History:        Patient has no prior history of Echocardiogram examinations.                 Risk Factors:Hypertension.  Sonographer:    Phineas Douglas Referring Phys: [phone] [place]  1. Left ventricular ejection fraction, by estimation, is 60 to 65%. The left ventricle has normal function. The left ventricle has no regional wall motion abnormalities. Left ventricular diastolic parameters were normal.  2. Right ventricular systolic function is normal. The right ventricular size is normal. There is normal pulmonary artery systolic pressure.  3. Left atrial size was mildly dilated.  4. The mitral valve is normal in structure. Trivial mitral valve regurgitation. No evidence of mitral stenosis.  5. The aortic valve is normal in structure. Aortic valve regurgitation is not visualized. No aortic stenosis is present.  6. The inferior vena cava is normal in size with greater than 50% respiratory variability, suggesting right atrial pressure of 3 mmHg. FINDINGS  Left Ventricle: Left ventricular ejection fraction, by estimation, is 60 to 65%. The left ventricle has normal function. The left ventricle has no regional wall motion abnormalities. The left ventricular internal cavity size was normal in size. There is  no asymmetric left ventricular hypertrophy of the basal-septal segment. Left ventricular diastolic parameters were normal. Right Ventricle: The right ventricular size is normal. No increase in right ventricular wall thickness. Right ventricular systolic  function is normal. There is normal pulmonary artery systolic pressure. The tricuspid regurgitant velocity is 2.14 m/s, and  with an assumed right atrial pressure of 3 mmHg, the estimated right ventricular systolic pressure is [id number] mmHg. Left Atrium: Left atrial size was mildly dilated. Right Atrium: Right atrial size was normal in size. Pericardium: There is no evidence of pericardial effusion. Mitral Valve: The mitral valve is normal in structure. Trivial mitral valve regurgitation. No evidence of mitral valve stenosis. Tricuspid Valve: The tricuspid valve is normal in structure. Tricuspid valve regurgitation is not demonstrated. No evidence of tricuspid stenosis. Aortic Valve: The aortic valve is normal in structure. Aortic valve regurgitation is not visualized. No aortic  stenosis is present. Pulmonic Valve: The pulmonic valve was normal in structure. Pulmonic valve regurgitation is not visualized. No evidence of pulmonic stenosis. Aorta: The aortic root is normal in size and structure. Venous: The inferior vena cava is normal in size with greater than 50% respiratory variability, suggesting right atrial pressure of 3 mmHg. IAS/Shunts: No atrial level shunt detected by color flow Doppler.  LEFT VENTRICLE PLAX 2D LVIDd:         4.00 cm      Diastology LVIDs:         2.50 cm      LV e' medial:    8.05 cm/s LV PW:         1.10 cm      LV E/e' medial:  9.6 LV IVS:        1.30 cm      LV e' lateral:   10.60 cm/s LVOT diam:     2.00 cm      LV E/e' lateral: 7.3 LV SV:         93 LV SV Index:   47 LVOT Area:     3.14 cm  LV Volumes (MOD) LV vol d, MOD A2C: 121.0 ml LV vol d, MOD A4C: 112.0 ml LV vol s, MOD A2C: 36.3 ml LV vol s, MOD A4C: 33.5 ml LV SV MOD A2C:     84.7 ml LV SV MOD A4C:     112.0 ml LV SV MOD BP:      82.1 ml RIGHT VENTRICLE             IVC RV Basal diam:  3.50 cm     IVC diam: 2.00 cm RV S prime:     13.30 cm/s TAPSE (M-mode): 1.7 cm LEFT ATRIUM             Index        RIGHT ATRIUM           Index LA  diam:        4.10 cm 2.08 cm/m   RA Area:     13.40 cm LA Vol (A2C):   81.0 ml 41.11 ml/m  RA Volume:   29.90 ml  15.18 ml/m LA Vol (A4C):   66.4 ml 33.72 ml/m LA Biplane Vol: 72.7 ml 36.92 ml/m  AORTIC VALVE LVOT Vmax:   117.00 cm/s LVOT Vmean:  86.100 cm/s LVOT VTI:    0.295 m  AORTA Ao Root diam: 3.60 cm Ao Asc diam:  2.90 cm MITRAL VALVE               TRICUSPID VALVE MV Area (PHT): 4.17 cm    TR Peak grad:   18.3 mmHg MV Decel Time: 182 msec    TR Vmax:        214.00 cm/s MV E velocity: 77.60 cm/s MV A velocity: 57.40 cm/s  SHUNTS MV E/A ratio:  1.35        Systemic VTI:  0.30 m                            Systemic Diam: 2.00 cm Glori Bickers MD Electronically signed by Glori Bickers MD Signature Date/Time: 09/05/2022/3:02:10 PM    Final    EEG adult  Result Date: 09/04/2022 Lora Havens, MD     09/04/2022  8:34 PM Patient Name: HAMDI DEFRANCIS MRN: [id number] Epilepsy Attending: Lora Havens Referring Physician/Provider: Karmen Bongo, MD Date: 09/04/2022  Duration: 21.48 mins Patient history: 67yo F with syncope. EEG to evaluate for seizure Level of alertness: Awake AEDs during EEG study: None Technical aspects: This EEG study was done with scalp electrodes positioned according to the 10-20 International system of electrode placement. Electrical activity was reviewed with band pass filter of 1-70Hz , sensitivity of 7 uV/mm, display speed of 61mm/sec with a 60Hz  notched filter applied as appropriate. EEG data were recorded continuously and digitally stored.  Video monitoring was available and reviewed as appropriate. Description: The posterior dominant rhythm consists of 10 Hz activity of moderate voltage (25-35 uV) seen predominantly in posterior head regions, symmetric and reactive to eye opening and eye closing. Hyperventilation and photic stimulation were not performed.   IMPRESSION: This study is within normal limits. No seizures or epileptiform discharges were seen throughout the  recording. A normal interictal EEG does not exclude the diagnosis of epilepsy. Lora Havens   CT Head Wo Contrast  Result Date: 09/04/2022 CLINICAL DATA:  Syncope EXAM: CT HEAD WITHOUT CONTRAST TECHNIQUE: Contiguous axial images were obtained from the base of the skull through the vertex without intravenous contrast. RADIATION DOSE REDUCTION: This exam was performed according to the departmental dose-optimization program which includes automated exposure control, adjustment of the mA and/or kV according to patient size and/or use of iterative reconstruction technique. COMPARISON:  12/01/2006 FINDINGS: Brain: No evidence of acute infarction, hemorrhage, hydrocephalus, extra-axial collection or mass lesion/mass effect. Vascular: Atherosclerotic calcifications involving the large vessels of the skull base. No unexpected hyperdense vessel. Skull: Normal. Negative for fracture or focal lesion. Sinuses/Orbits: No acute finding. Other: None. IMPRESSION: No acute intracranial abnormality. Electronically Signed   By: Davina Poke D.O.   On: 09/04/2022 09:13     Labs:   Basic Metabolic Panel: Recent Labs  Lab 09/04/22 0819 09/05/22 0250  NA 139 142  K 3.2* 3.2*  CL 103 105  CO2 25 27  GLUCOSE 124* 119*  BUN 12 13  CREATININE 0.84 0.87  CALCIUM 9.2 9.3  MG 1.8  --    GFR Estimated Creatinine Clearance: 71.9 mL/min (by C-G formula based on SCr of 0.87 mg/dL). Liver Function Tests: No results for input(s): "AST", "ALT", "ALKPHOS", "BILITOT", "PROT", "ALBUMIN" in the last 168 hours. No results for input(s): "LIPASE", "AMYLASE" in the last 168 hours. No results for input(s): "AMMONIA" in the last 168 hours. Coagulation profile No results for input(s): "INR", "PROTIME" in the last 168 hours.  CBC: Recent Labs  Lab 09/04/22 0819 09/05/22 0250  WBC 6.9 5.2  NEUTROABS 4.9  --   HGB 12.5 10.6*  HCT 38.4 32.0*  MCV 90.8 88.9  PLT 221 204   Cardiac Enzymes: No results for input(s):  "CKTOTAL", "CKMB", "CKMBINDEX", "TROPONINI" in the last 168 hours. BNP: Invalid input(s): "POCBNP" CBG: Recent Labs  Lab 09/04/22 1651 09/04/22 2129 09/05/22 0817 09/05/22 1238 09/05/22 1500  GLUCAP 210* 151* 116* 115* 154*   D-Dimer No results for input(s): "DDIMER" in the last 72 hours. Hgb A1c Recent Labs    09/04/22 0819  HGBA1C 6.2*   Lipid Profile No results for input(s): "CHOL", "HDL", "LDLCALC", "TRIG", "CHOLHDL", "LDLDIRECT" in the last 72 hours. Thyroid function studies No results for input(s): "TSH", "T4TOTAL", "T3FREE", "THYROIDAB" in the last 72 hours.  Invalid input(s): "FREET3" Anemia work up No results for input(s): "VITAMINB12", "FOLATE", "FERRITIN", "TIBC", "IRON", "RETICCTPCT" in the last 72 hours. Microbiology Recent Results (from the past 240 hour(s))  Resp panel by RT-PCR (RSV, Flu A&B, Covid) Anterior Nasal  Swab     Status: Abnormal   Collection Time: 09/04/22 11:24 AM   Specimen: Anterior Nasal Swab  Result Value Ref Range Status   SARS Coronavirus 2 by RT PCR POSITIVE (A) NEGATIVE Final   Influenza A by PCR NEGATIVE NEGATIVE Final   Influenza B by PCR NEGATIVE NEGATIVE Final    Comment: (NOTE) The Xpert Xpress SARS-CoV-2/FLU/RSV plus assay is intended as an aid in the diagnosis of influenza from Nasopharyngeal swab specimens and should not be used as a sole basis for treatment. Nasal washings and aspirates are unacceptable for Xpert Xpress SARS-CoV-2/FLU/RSV testing.  Fact Sheet for Patients: EntrepreneurPulse.com.au  Fact Sheet for Healthcare Providers: IncredibleEmployment.be  This test is not yet approved or cleared by the [place] FDA and has been authorized for detection and/or diagnosis of SARS-CoV-2 by FDA under an Emergency Use Authorization (EUA). This EUA will remain in effect (meaning this test can be used) for the duration of the COVID-19 declaration under Section 564(b)(1) of the Act,  21 U.S.C. section 360bbb-3(b)(1), unless the authorization is terminated or revoked.     Resp Syncytial Virus by PCR NEGATIVE NEGATIVE Final    Comment: (NOTE) Fact Sheet for Patients: EntrepreneurPulse.com.au  Fact Sheet for Healthcare Providers: IncredibleEmployment.be  This test is not yet approved or cleared by the [place] FDA and has been authorized for detection and/or diagnosis of SARS-CoV-2 by FDA under an Emergency Use Authorization (EUA). This EUA will remain in effect (meaning this test can be used) for the duration of the COVID-19 declaration under Section 564(b)(1) of the Act, 21 U.S.C. section 360bbb-3(b)(1), unless the authorization is terminated or revoked.  Performed at [facility], [place] [address] [place], [place] [address]      Discharge Instructions:   Discharge Instructions     Diet Carb Modified   Complete by: As directed    Discharge instructions   Complete by: As directed    Follow-up with your primary care provider in 1 week.  Take time to change positions.  One of your diuretic medication has been discontinued.  Discuss with your primary care provider regarding anxiety management.   Increase activity slowly   Complete by: As directed       Allergies as of 09/05/2022       Reactions   Penicillins Shortness Of Breath, Swelling, Other (See Comments)   Tongue swelling   Shellfish-derived Products Shortness Of Breath, Swelling   Tongue swelling   Morphine And Related Itching, Other (See Comments)   Hallucinations         Medication List     STOP taking these medications    furosemide 40 MG tablet Commonly known as: LASIX       TAKE these medications    acetaminophen 650 MG CR tablet Commonly known as: TYLENOL Take 650-1,300 mg by mouth every 8 (eight) hours as needed for pain.   atorvastatin 20 MG tablet Commonly known as: LIPITOR Take 40 mg by mouth at bedtime.   citalopram  20 MG tablet Commonly known as: CELEXA Take 1 tablet (20 mg total) by mouth daily.   hydrochlorothiazide 25 MG tablet Commonly known as: HYDRODIURIL Take 1 tablet (25 mg total) by mouth daily.   HYDROcodone-acetaminophen 7.5-325 MG tablet Commonly known as: Norco Take 1 tablet by mouth every 6 (six) hours as needed for moderate pain.   loratadine 10 MG tablet Commonly known as: CLARITIN Take 10 mg by mouth daily as needed for allergies.   melatonin  5 MG Tabs Take 5 mg by mouth at bedtime as needed (sleep).   metFORMIN 500 MG tablet Commonly known as: GLUCOPHAGE Take 500 mg by mouth 2 (two) times daily with a meal.   metoprolol succinate 25 MG 24 hr tablet Commonly known as: TOPROL-XL Take 25 mg by mouth daily.   ondansetron 4 MG disintegrating tablet Commonly known as: ZOFRAN-ODT Take 1 tablet (4 mg total) by mouth every 8 (eight) hours as needed for nausea or vomiting.   rivaroxaban 20 MG Tabs tablet Commonly known as: Xarelto Take 1 tablet (20 mg total) by mouth daily with supper.   valsartan 80 MG tablet Commonly known as: Diovan Take 1 tablet (80 mg total) by mouth daily. What changed: when to take this        Dent, MD Follow up in 1 week(s).   Specialty: Internal Medicine Contact information: [id number] [place] [address] [phone]                  Time coordinating discharge: 39 minutes  Signed:  Nichols Corter  [facility] 09/05/2022, 4:35 PM

## 2022-11-07 ENCOUNTER — Ambulatory Visit: Payer: BC Managed Care – PPO | Admitting: Cardiology

## 2022-11-07 ENCOUNTER — Encounter: Payer: Self-pay | Admitting: Cardiology

## 2022-11-07 VITALS — BP 108/67 | HR 80 | Resp 16 | Ht 66.0 in | Wt 190.0 lb

## 2022-11-07 DIAGNOSIS — R739 Hyperglycemia, unspecified: Secondary | ICD-10-CM

## 2022-11-07 DIAGNOSIS — E782 Mixed hyperlipidemia: Secondary | ICD-10-CM

## 2022-11-07 DIAGNOSIS — I2510 Atherosclerotic heart disease of native coronary artery without angina pectoris: Secondary | ICD-10-CM

## 2022-11-07 DIAGNOSIS — R55 Syncope and collapse: Secondary | ICD-10-CM

## 2022-11-07 DIAGNOSIS — I700 Atherosclerosis of aorta: Secondary | ICD-10-CM

## 2022-11-07 NOTE — Progress Notes (Signed)
Primary Physician/Referring:  Rometta Emery, MD  Patient ID: [name], female    DOB: 12-25-1955, 67 y.o.   MRN: [id number]  Chief Complaint  Patient presents with   Loss of Consciousness   New Patient (Initial Visit)    Referred by Dr. Mikeal Hawthorne    HPI:    [name]  is a 67 y.o.female with past medical history significant of hypertension, hyperlipidemia, prediabetes, history of recurrent DVT due to Antiphospholipid antibody positive on Xarelto and recent parotidectomy on 08/20/22 with pathology c/w benign oncocytic neoplasm presented to the hospital on 09/04/2022 with syncope.    Patient states that for the past several months, she suddenly feels like passing out, all episodes happen when she is already up on her feet.  She has not had a syncope while sitting or laying down.  She feels lightheaded, feels like she is going to pass out followed by near syncope.  The last episode of fall where she could not stop herself was when she presented to the emergency room on 09/04/2022.  Patient states that she was a heavy smoker but quit in 2017.  She is on her feet, works in [employer] but does not have an exercise program.  Past Medical History:  Diagnosis Date   Anxiety    Colitis 08/2001   DVT (deep venous thrombosis) (HCC)    RLE 06/2016, 08/2020   Headache    has tunnel vision when she has a headache   History of blood transfusion    when twins were born.   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   Pneumonia    Pre-diabetes    Past Surgical History:  Procedure Laterality Date   ABDOMINAL HYSTERECTOMY     BREAST LUMPECTOMY     BUNIONECTOMY     C Section     PAROTIDECTOMY Left 08/20/2022   Procedure: LEFT SUPERFICIAL PAROTIDECTOMY WITH FACIAL NERVE DISSECTION;  Surgeon: Serena Colonel, MD;  Location: [facility] OR;  Service: ENT;  Laterality: Left;  RNFA   TOTAL HIP ARTHROPLASTY Right 08/30/2021    Procedure: RIGHT TOTAL HIP ARTHROPLASTY ANTERIOR APPROACH;  Surgeon: Tarry Kos, MD;  Location: [facility] OR;  Service: Orthopedics;  Laterality: Right;   Family History  Problem Relation Age of Onset   Lupus Mother    Parkinson's disease Mother    Cancer Father    Breast cancer Sister    Cancer Brother    Gastric cancer Brother    Heart failure Brother    Other Maternal Grandfather        Blood clot    Social History   Tobacco Use   Smoking status: Former    Packs/day: 1.00    Years: 42.00    Additional pack years: 0.00    Total pack years: 42.00    Types: Cigarettes    Quit date: 07/27/2015    Years since quitting: 7.2   Smokeless tobacco: Never  Substance Use Topics   Alcohol use: Not Currently    Comment: occasional   Marital Status: Widowed  ROS  Review of Systems  Cardiovascular:  Positive for near-syncope. Negative for chest pain, dyspnea on exertion and leg swelling.   Objective      11/07/2022    3:11 PM 09/05/2022    3:01 PM 09/05/2022   12:39 PM  Vitals with BMI  Height 5\' 6"     Weight  190 lbs    BMI 30.68    Systolic 108 128 387  Diastolic 67 63 67  Pulse 80 71 67   SpO2: 97 %  Orthostatic VS for the past 72 hrs (Last 3 readings):  Orthostatic BP Patient Position BP Location Cuff Size Orthostatic Pulse  11/07/22 1522 115/61 Sitting Left Arm Large 91  11/07/22 1521 126/66 Sitting Left Arm Large 79  11/07/22 1520 131/60 Supine Left Arm Large 85     Physical Exam Neck:     Vascular: No carotid bruit or JVD.  Cardiovascular:     Rate and Rhythm: Normal rate and regular rhythm.     Pulses: Intact distal pulses.     Heart sounds: Normal heart sounds. No murmur heard.    No gallop.     Comments: Superficial varicose veins noted right lower extremity Pulmonary:     Effort: Pulmonary effort is normal.     Breath sounds: Normal breath sounds.  Abdominal:     General: Bowel sounds are normal.     Palpations: Abdomen is soft.  Musculoskeletal:      Right lower leg: No edema.     Left lower leg: No edema.  Skin:    Findings: Erythema (Right leg chronic venous stasis changes noted) present.     Laboratory examination:   Recent Labs    08/20/22 0938 08/20/22 1258 09/04/22 0819 09/05/22 0250  NA 138 143 139 142  K 2.8* 3.2* 3.2* 3.2*  CL 102 104 103 105  CO2 31  --  25 27  GLUCOSE 120* 129* 124* 119*  BUN 15 17 12 13   CREATININE 0.87 0.80 0.84 0.87  CALCIUM 9.3  --  9.2 9.3  GFRNONAA >60  --  >60 >60    Lab Results  Component Value Date   GLUCOSE 119 (H) 09/05/2022   NA 142 09/05/2022   K 3.2 (L) 09/05/2022   CL 105 09/05/2022   CO2 27 09/05/2022   BUN 13 09/05/2022   CREATININE 0.87 09/05/2022   GFRNONAA >60 09/05/2022   CALCIUM 9.3 09/05/2022   PROT 6.2 (L) 08/23/2021   ALBUMIN 3.9 08/23/2021   LABGLOB 2.5 07/26/2020   AGRATIO 1.8 07/26/2020   BILITOT 2.3 (H) 08/23/2021   ALKPHOS 67 08/23/2021   AST 20 08/23/2021   ALT 21 08/23/2021   ANIONGAP 10 09/05/2022      Lab Results  Component Value Date   ALT 21 08/23/2021   AST 20 08/23/2021   ALKPHOS 67 08/23/2021   BILITOT 2.3 (H) 08/23/2021       Latest Ref Rng & Units 08/23/2021    2:59 PM 07/26/2020   10:18 AM 12/09/2013    2:30 PM  Hepatic Function  Total Protein 6.5 - 8.1 g/dL 6.2  7.1  7.6   Albumin 3.5 - 5.0 g/dL 3.9  4.6  4.6   AST 15 - 41 U/L 20  18  25    ALT 0 - 44 U/L 21  25  20    Alk Phosphatase 38 - 126 U/L 67  117  114   Total Bilirubin 0.3 - 1.2 mg/dL 2.3  1.1  1.0    HEMOGLOBIN A1C Lab Results  Component Value Date   HGBA1C 6.2 (H) 09/04/2022   MPG 131 09/04/2022   Lab Results  Component Value Date   TSH 1.310 07/26/2020   External labs:   Labs 10/16/2022:  Total cholesterol 108, triglycerides 211, HDL 36, LDL 44.  Serum glucose 57 mg, BUN  17, creatinine 0.90, EGFR 71 mL, potassium 3.5.  Total bilirubin minimally elevated at 1.5, LFTs completely normal.  Hb 13.5/HCT 41.8, platelets 284, normal indicis.  A1c  6.2%.  Radiology:   CT angiogram chest 07/22/2016: Cardiovascular:  There are no filling defects within the pulmonary arteries to suggest pulmonary embolus. No aortic dissection. 2.   Moderate atherosclerosis of the thoracic aorta. Coronary artery calcifications. Heart is normal in size. No pericardial effusion.  Cardiac Studies:   Echocardiogram 09/05/2022:  1. Left ventricular ejection fraction, by estimation, is 60 to 65%. The left ventricle has normal function. The left ventricle has no regional wall motion abnormalities. Left ventricular diastolic parameters were normal.  2. Right ventricular systolic function is normal. The right ventricular size is normal. There is normal pulmonary artery systolic pressure.  3. Left atrial size was mildly dilated.  4. The mitral valve is normal in structure. Trivial mitral valve regurgitation. No evidence of mitral stenosis.  5. The aortic valve is normal in structure. Aortic valve regurgitation is not visualized. No aortic stenosis is present.  6. The inferior vena cava is normal in size with greater than 50% respiratory variability, suggesting right atrial pressure of 3 mmHg.  EKG:   EKG 11/07/2022: Normal sinus rhythm at the rate of 81 bpm, left atrial enlargement, otherwise normal EKG.    Medications and allergies   Allergies  Allergen Reactions   Penicillins Shortness Of Breath, Swelling and Other (See Comments)    Tongue swelling    Shellfish-Derived Products Shortness Of Breath and Swelling    Tongue swelling    Morphine And Codeine Itching and Other (See Comments)    Hallucinations      Medication list   Current Outpatient Medications:    acetaminophen (TYLENOL) 650 MG CR tablet, Take 650-1,300 mg by mouth every 8 (eight) hours as needed for pain., Disp: , Rfl:    atorvastatin (LIPITOR) 20 MG tablet, Take 40 mg by mouth at bedtime., Disp: , Rfl:    citalopram (CELEXA) 20 MG tablet, Take 1 tablet (20 mg total) by mouth daily.,  Disp: 30 tablet, Rfl: 3   furosemide (LASIX) 40 MG tablet, Take 40 mg by mouth daily as needed for edema., Disp: , Rfl:    hydrochlorothiazide (HYDRODIURIL) 25 MG tablet, Take 1 tablet (25 mg total) by mouth daily., Disp: 90 tablet, Rfl: 3   melatonin 5 MG TABS, Take 5 mg by mouth at bedtime as needed (sleep)., Disp: , Rfl:    metFORMIN (GLUCOPHAGE) 500 MG tablet, Take 500 mg by mouth 2 (two) times daily with a meal., Disp: , Rfl:    metoprolol succinate (TOPROL-XL) 25 MG 24 hr tablet, Take 25 mg by mouth daily., Disp: , Rfl:    rivaroxaban (XARELTO) 20 MG TABS tablet, Take 1 tablet (20 mg total) by mouth daily with supper., Disp: 90 tablet, Rfl: 6   valsartan (DIOVAN) 80 MG tablet, Take 1 tablet (80 mg total) by mouth daily. (Patient taking differently: Take 80 mg by mouth at bedtime.), Disp: 90 tablet, Rfl: 3  Assessment     ICD-10-CM   1. Vasovagal syndrome  R55 EKG 12-Lead    PCV MYOCARDIAL PERFUSION WO LEXISCAN    2. Coronary artery calcification seen on CAT scan  I25.10 PCV MYOCARDIAL PERFUSION WO LEXISCAN    3. Aortic atherosclerosis (HCC)  I70.0 PCV MYOCARDIAL PERFUSION WO LEXISCAN    4. Mixed hypercholesterolemia and hypertriglyceridemia  E78.2     5. Hyperglycemia  R73.9 PCV MYOCARDIAL PERFUSION  WO LEXISCAN       Orders Placed This Encounter  Procedures   PCV MYOCARDIAL PERFUSION WO LEXISCAN    Standing Status:   Future    Standing Expiration Date:   01/07/2023   EKG 12-Lead    No orders of the defined types were placed in this encounter.   Medications Discontinued During This Encounter  Medication Reason   loratadine (CLARITIN) 10 MG tablet    HYDROcodone-acetaminophen (NORCO) 7.5-325 MG tablet No longer needed (for PRN medications)   ondansetron (ZOFRAN-ODT) 4 MG disintegrating tablet No longer needed (for PRN medications)     Recommendations:   [name] is a 67 y.o. female with past medical history significant of hypertension, hyperlipidemia,  prediabetes, history of recurrent DVT due to Antiphospholipid antibody positive on Xarelto and recent parotidectomy on 08/20/22 with pathology c/w benign oncocytic neoplasm presented to the hospital on 09/04/2022 with syncope.    1. Vasovagal syndrome Patient's episode of syncope appears to be clearly vasovagal, I do not suspect any significant arrhythmias or cardiac etiology for syncope.  Carotid sinus massage did not elicit any symptoms.  - EKG 12-Lead - PCV MYOCARDIAL PERFUSION WO LEXISCAN; Future  2. Coronary artery calcification seen on CAT scan Her vascular examination is completely normal except for mild venous varicosities of the right lower extremity.  Patient has had multiple DVTs in the past and presently due to primary hypercoagulable state due to antiphospholipid antibody syndrome, she is on Xarelto chronically.  After review of her chart, as way back as 2018, she has age advanced coronary and aortic atherosclerosis from heavy tobacco use which she quit in 2017.  She would certainly be at high risk for coronary events as well, she has never had any kind of stress testing or cardiac evaluation.  Patient has no set exercise program, she works in [employer], although active, in view of the fact that she is high risk, I have scheduled for a nuclear stress test to exclude ischemic etiology, she also has underlying hypertension, hypercholesterolemia which is mixed variety and hyperglycemia as a risk factor as well.  - PCV MYOCARDIAL PERFUSION WO LEXISCAN; Future  3. Aortic atherosclerosis (HCC) Significant plaque burden was evident as we back as 2018 by CT scan.  She will need aggressive primary prevention strategy. - PCV MYOCARDIAL PERFUSION WO LEXISCAN; Future  4. Mixed hypercholesterolemia and hypertriglyceridemia I had an extensive and long discussion with the patient regarding mixed hypercholesterolemia.  Lipids under excellent control with statin therapy, triglycerides are  elevated due to poor dietary habits.  We have discussed regarding making lifestyle changes and also she will start having a routine exercise program as well.  I expect the triglycerides to improve.  5. Hyperglycemia Patient is on Diovan for hyperglycemia and for hypertension, continue the same.  I have discussed low calorie and also low glycemic diet with the patient.  With regard to chronic use of furosemide, patient drinks excessively all the time and walks around with a bottle and a half.  Advised her that she is also taking furosemide and it negates the effect of excess fluid intake as well, advised her to take furosemide only if her leg swelling is worse but she is already on HCTZ, to continue the same for now.  Otherwise stable from cardiac standpoint, unless nuclear stress test is abnormal, I will see her back on a as needed basis.  She is presently on best medical therapy including low-dose beta-blocker therapy which would help with  vasovagal episodes and also on ARB along with HCTZ.  - PCV MYOCARDIAL PERFUSION WO LEXISCAN; Future  Other orders - furosemide (LASIX) 40 MG tablet; Take 40 mg by mouth daily as needed for edema.    Yates Decamp, MD, [facility] 11/07/2022, 4:14 PM Office: [phone]

## 2022-11-08 ENCOUNTER — Encounter: Payer: Self-pay | Admitting: Cardiology

## 2022-11-15 ENCOUNTER — Ambulatory Visit: Payer: BC Managed Care – PPO

## 2022-11-15 DIAGNOSIS — R55 Syncope and collapse: Secondary | ICD-10-CM

## 2022-11-15 DIAGNOSIS — I700 Atherosclerosis of aorta: Secondary | ICD-10-CM

## 2022-11-15 DIAGNOSIS — R739 Hyperglycemia, unspecified: Secondary | ICD-10-CM

## 2022-11-15 DIAGNOSIS — I2510 Atherosclerotic heart disease of native coronary artery without angina pectoris: Secondary | ICD-10-CM

## 2022-11-18 LAB — PCV MYOCARDIAL PERFUSION WO LEXISCAN
Angina Index: 0
ST Depression (mm): 0 mm

## 2022-11-21 ENCOUNTER — Ambulatory Visit (INDEPENDENT_AMBULATORY_CARE_PROVIDER_SITE_OTHER): Payer: BC Managed Care – PPO | Admitting: Student

## 2022-11-21 ENCOUNTER — Ambulatory Visit (HOSPITAL_BASED_OUTPATIENT_CLINIC_OR_DEPARTMENT_OTHER): Payer: BC Managed Care – PPO

## 2022-11-21 ENCOUNTER — Encounter (HOSPITAL_BASED_OUTPATIENT_CLINIC_OR_DEPARTMENT_OTHER): Payer: Self-pay | Admitting: Student

## 2022-11-21 DIAGNOSIS — M25552 Pain in left hip: Secondary | ICD-10-CM

## 2022-11-21 DIAGNOSIS — M1612 Unilateral primary osteoarthritis, left hip: Secondary | ICD-10-CM | POA: Diagnosis not present

## 2022-11-21 MED ORDER — TRIAMCINOLONE ACETONIDE 40 MG/ML IJ SUSP
2.0000 mL | INTRAMUSCULAR | Status: AC | PRN
Start: 2022-11-21 — End: 2022-11-21
  Administered 2022-11-21: 2 mL via INTRA_ARTICULAR

## 2022-11-21 MED ORDER — LIDOCAINE HCL 1 % IJ SOLN
4.0000 mL | INTRAMUSCULAR | Status: AC | PRN
  Administered 2022-11-21: 4 mL

## 2022-11-21 NOTE — Progress Notes (Signed)
Chief Complaint: Left hip pain     History of Present Illness:    [name] is a 67 y.o. female presenting today for evaluation of left hip pain.  Patient states that this began approximately 2 days ago and is unsure of a specific injury.  The pain is 7/10 today but has been at a 10/10.  Pain is located in the groin and occasionally radiates to the lateral hip.  She is having difficulty particularly with moving from a seated to standing position and vice versa.  Denies any numbness or tingling.  She has taken Tylenol arthritis and ibuprofen which has slightly eased the pain.  She was able to go to work this morning, however this was very difficult as her job at [employer] is physically demanding.  Patient does have a history of right hip osteoarthritis wants a total hip arthroplasty last March by Dr Roda Shutters.  She states that her right hip has been doing very well.    Surgical History:   Right THA 08/30/2021  PMH/PSH/Family History/Social History/Meds/Allergies:    Past Medical History:  Diagnosis Date   Anxiety    Colitis 08/2001   DVT (deep venous thrombosis) (HCC)    RLE 06/2016, 08/2020   Headache    has tunnel vision when she has a headache   History of blood transfusion    when twins were born.   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   Pneumonia    Pre-diabetes    Past Surgical History:  Procedure Laterality Date   ABDOMINAL HYSTERECTOMY     BREAST LUMPECTOMY     BUNIONECTOMY     C Section     PAROTIDECTOMY Left 08/20/2022   Procedure: LEFT SUPERFICIAL PAROTIDECTOMY WITH FACIAL NERVE DISSECTION;  Surgeon: Serena Colonel, MD;  Location: [facility] OR;  Service: ENT;  Laterality: Left;  RNFA   TOTAL HIP ARTHROPLASTY Right 08/30/2021   Procedure: RIGHT TOTAL HIP ARTHROPLASTY ANTERIOR APPROACH;  Surgeon: Tarry Kos, MD;  Location: [facility] OR;  Service: Orthopedics;   Laterality: Right;   Social History   Socioeconomic History   Marital status: Widowed    Spouse name: Not on file   Number of children: 3   Years of education: Not on file   Highest education level: Not on file  Occupational History   Not on file  Tobacco Use   Smoking status: Former    Packs/day: 1.00    Years: 42.00    Additional pack years: 0.00    Total pack years: 42.00    Types: Cigarettes    Quit date: 07/27/2015    Years since quitting: 7.3   Smokeless tobacco: Never  Vaping Use   Vaping Use: Never used  Substance and Sexual Activity   Alcohol use: Not Currently    Comment: occasional   Drug use: No   Sexual activity: Yes    Birth control/protection: Condom  Other Topics Concern   Not on file  Social History Narrative   Not on file   Social Determinants of Health   Financial Resource Strain: Not on file  Food Insecurity: Not on file  Transportation Needs: Not on file  Physical Activity: Not on file  Stress: Not on  file  Social Connections: Not on file   Family History  Problem Relation Age of Onset   Lupus Mother    Parkinson's disease Mother    Cancer Father    Breast cancer Sister    Cancer Brother    Gastric cancer Brother    Heart failure Brother    Other Maternal Grandfather        Blood clot   Allergies  Allergen Reactions   Penicillins Shortness Of Breath, Swelling and Other (See Comments)    Tongue swelling    Shellfish-Derived Products Shortness Of Breath and Swelling    Tongue swelling    Morphine And Codeine Itching and Other (See Comments)    Hallucinations    Current Outpatient Medications  Medication Sig Dispense Refill   acetaminophen (TYLENOL) 650 MG CR tablet Take 650-1,300 mg by mouth every 8 (eight) hours as needed for pain.     atorvastatin (LIPITOR) 20 MG tablet Take 40 mg by mouth at bedtime.     citalopram (CELEXA) 20 MG tablet Take 1 tablet (20 mg total) by mouth daily. 30 tablet 3   furosemide (LASIX) 40 MG tablet  Take 40 mg by mouth daily as needed for edema.     hydrochlorothiazide (HYDRODIURIL) 25 MG tablet Take 1 tablet (25 mg total) by mouth daily. 90 tablet 3   melatonin 5 MG TABS Take 5 mg by mouth at bedtime as needed (sleep).     metFORMIN (GLUCOPHAGE) 500 MG tablet Take 500 mg by mouth 2 (two) times daily with a meal.     metoprolol succinate (TOPROL-XL) 25 MG 24 hr tablet Take 25 mg by mouth daily.     rivaroxaban (XARELTO) 20 MG TABS tablet Take 1 tablet (20 mg total) by mouth daily with supper. 90 tablet 6   valsartan (DIOVAN) 80 MG tablet Take 1 tablet (80 mg total) by mouth daily. (Patient taking differently: Take 80 mg by mouth at bedtime.) 90 tablet 3   No current facility-administered medications for this visit.   No results found.  Review of Systems:   A ROS was performed including pertinent positives and negatives as documented in the HPI.  Physical Exam :   Constitutional: NAD and appears stated age Neurological: Alert and oriented Psych: Appropriate affect and cooperative There were no vitals taken for this visit.   Comprehensive Musculoskeletal Exam:    Patient is limited with passive flexion of the hip to approximately 90 degrees due to pain.  10 degrees of internal and external rotation of the hip causes significant discomfort and guarding.  No tenderness over greater trochanter.  Imaging:   Xray (Left hip 4 views): Mild to moderate osteoarthritis   I personally reviewed and interpreted the radiographs.   Assessment:   67 y.o. female with severe left hip pain ongoing for the last few days.  Based on her presentation and past history, I do believe this is a result of osteoarthritis.  She is on long term anticoagulation and is unable to take NSAIDs, so at this point I believe that a cortisone injection would be the best option for pain relief.  Patient is agreeable and this was performed today without complication.  I would like to get her over to see Dr. Roda Shutters for further  evaluation and discussion of treatment options.  All questions addressed at this time.  Plan :    - Ultrasound-guided injection of the hip performed in clinic today - Follow up with Dr. Roda Shutters for further evaluation  Procedure Note  Patient: [name]             Date of Birth: [birth year]           MRN: [id number]             Visit Date: 11/21/2022  Procedures: Visit Diagnoses:  1. Unilateral primary osteoarthritis, left hip     Large Joint Inj: L hip joint on 11/21/2022 3:43 PM Details: 22 G 3.5 in needle, ultrasound-guided anterior approach Medications: 2 mL triamcinolone acetonide 40 MG/ML; 4 mL lidocaine 1 % Outcome: tolerated well, no immediate complications Procedure, treatment alternatives, risks and benefits explained, specific risks discussed. Consent was given by the patient. Immediately prior to procedure a time out was called to verify the correct patient, procedure, equipment, support staff and site/side marked as required. Patient was prepped and draped in the usual sterile fashion.      I personally saw and evaluated the patient, and participated in the management and treatment plan.  Hazle Nordmann, PA-C Orthopedics  This document was dictated using Conservation officer, historic buildings. A reasonable attempt at proof reading has been made to minimize errors.

## 2022-11-27 ENCOUNTER — Ambulatory Visit (INDEPENDENT_AMBULATORY_CARE_PROVIDER_SITE_OTHER): Payer: BC Managed Care – PPO | Admitting: Orthopaedic Surgery

## 2022-11-27 DIAGNOSIS — M1612 Unilateral primary osteoarthritis, left hip: Secondary | ICD-10-CM | POA: Diagnosis not present

## 2022-11-27 NOTE — Progress Notes (Signed)
Office Visit Note   Patient: [name]           Date of Birth: 01-05-1956           MRN: [id number] Visit Date: 11/27/2022              Requested by: Rometta Emery, MD [address] [place],  [place] [address] PCP: Rometta Emery, MD   Assessment & Plan: Visit Diagnoses:  1. Primary osteoarthritis of left hip     Plan: Impression 67 year old female with left hip osteoarthritis.  Recently had an injection about a week ago so she would not be a candidate for total hip replacement for 3 months anyways.  Overall sounds like pain is manageable but she is mainly just worried the pain will become a lot more severe and she does not want to get to that point.  I recommend just see how the next couple months ago that she has had the injection and give [place] a better idea whether we should proceed with a total hip replacement or not.  Follow-Up Instructions: No follow-ups on file.   Orders:  No orders of the defined types were placed in this encounter.  No orders of the defined types were placed in this encounter.     Procedures: No procedures performed   Clinical Data: No additional findings.   Subjective: Chief Complaint  Patient presents with   Left Hip - Pain    HPI [name] comes in today for follow-up on left hip pain and osteoarthritis.  Underwent intra-articular steroid injection on 11/21/2022.  She feels improvement from this injection. Review of Systems  Constitutional: Negative.   HENT: Negative.    Eyes: Negative.   Respiratory: Negative.    Cardiovascular: Negative.   Endocrine: Negative.   Musculoskeletal: Negative.   Neurological: Negative.   Hematological: Negative.   Psychiatric/Behavioral: Negative.    All other systems reviewed and are negative.    Objective: Vital Signs: There were no vitals taken for this visit.  Physical Exam Vitals and nursing note reviewed.  Constitutional:      Appearance: She is well-developed.  HENT:      Head: Atraumatic.     Nose: Nose normal.  Eyes:     Extraocular Movements: Extraocular movements intact.  Cardiovascular:     Pulses: Normal pulses.  Pulmonary:     Effort: Pulmonary effort is normal.  Abdominal:     Palpations: Abdomen is soft.  Musculoskeletal:     Cervical back: Neck supple.  Skin:    General: Skin is warm.     Capillary Refill: Capillary refill takes less than 2 seconds.  Neurological:     Mental Status: She is alert. Mental status is at baseline.  Psychiatric:        Behavior: Behavior normal.        Thought Content: Thought content normal.        Judgment: Judgment normal.    Ortho Exam Examination of left hip is unchanged. Specialty Comments:  No specialty comments available.  Imaging: No results found.   PMFS History: Patient Active Problem List   Diagnosis Date Noted   Syncope and collapse 09/04/2022   Essential hypertension 09/04/2022   Controlled type 2 diabetes mellitus without complication, without long-term current use of insulin (HCC) 09/04/2022   Dyslipidemia 09/04/2022   Parotid mass 08/20/2022   Primary osteoarthritis of right hip 08/30/2021   Status post total replacement of right hip 08/30/2021  DVT (deep venous thrombosis) (HCC)    Acute thromboembolism of deep veins of right lower extremity (HCC) 07/26/2020   Depression 07/15/2007   COLLAGENOUS COLITIS 07/15/2007   WEIGHT LOSS 07/15/2007   HEADACHE 07/15/2007   COLONIC POLYPS, BENIGN 02/19/2001   Past Medical History:  Diagnosis Date   Anxiety    Colitis 08/2001   DVT (deep venous thrombosis) (HCC)    RLE 06/2016, 08/2020   Headache    has tunnel vision when she has a headache   History of blood transfusion    when twins were born.   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   Pneumonia    Pre-diabetes     Family History  Problem Relation Age of Onset   Lupus Mother    Parkinson's disease  Mother    Cancer Father    Breast cancer Sister    Cancer Brother    Gastric cancer Brother    Heart failure Brother    Other Maternal Grandfather        Blood clot    Past Surgical History:  Procedure Laterality Date   ABDOMINAL HYSTERECTOMY     BREAST LUMPECTOMY     BUNIONECTOMY     C Section     PAROTIDECTOMY Left 08/20/2022   Procedure: LEFT SUPERFICIAL PAROTIDECTOMY WITH FACIAL NERVE DISSECTION;  Surgeon: Serena Colonel, MD;  Location: [facility] OR;  Service: ENT;  Laterality: Left;  RNFA   TOTAL HIP ARTHROPLASTY Right 08/30/2021   Procedure: RIGHT TOTAL HIP ARTHROPLASTY ANTERIOR APPROACH;  Surgeon: Tarry Kos, MD;  Location: [facility] OR;  Service: Orthopedics;  Laterality: Right;   Social History   Occupational History   Not on file  Tobacco Use   Smoking status: Former    Packs/day: 1.00    Years: 42.00    Additional pack years: 0.00    Total pack years: 42.00    Types: Cigarettes    Quit date: 07/27/2015    Years since quitting: 7.3   Smokeless tobacco: Never  Vaping Use   Vaping Use: Never used  Substance and Sexual Activity   Alcohol use: Not Currently    Comment: occasional   Drug use: No   Sexual activity: Yes    Birth control/protection: Condom

## 2022-12-10 ENCOUNTER — Other Ambulatory Visit: Payer: Self-pay

## 2022-12-10 ENCOUNTER — Emergency Department (HOSPITAL_COMMUNITY)
Admission: EM | Admit: 2022-12-10 | Discharge: 2022-12-10 | Disposition: A | Discharge status: Home / Self Care | Payer: BC Managed Care – PPO | Attending: Emergency Medicine | Admitting: Emergency Medicine

## 2022-12-10 ENCOUNTER — Emergency Department (HOSPITAL_COMMUNITY): Payer: BC Managed Care – PPO

## 2022-12-10 ENCOUNTER — Encounter (HOSPITAL_COMMUNITY): Payer: Self-pay | Admitting: Emergency Medicine

## 2022-12-10 DIAGNOSIS — J189 Pneumonia, unspecified organism: Secondary | ICD-10-CM

## 2022-12-10 DIAGNOSIS — Z20822 Contact with and (suspected) exposure to covid-19: Secondary | ICD-10-CM | POA: Diagnosis not present

## 2022-12-10 DIAGNOSIS — Z79899 Other long term (current) drug therapy: Secondary | ICD-10-CM | POA: Insufficient documentation

## 2022-12-10 DIAGNOSIS — Z7984 Long term (current) use of oral hypoglycemic drugs: Secondary | ICD-10-CM | POA: Diagnosis not present

## 2022-12-10 DIAGNOSIS — I10 Essential (primary) hypertension: Secondary | ICD-10-CM | POA: Diagnosis not present

## 2022-12-10 DIAGNOSIS — E119 Type 2 diabetes mellitus without complications: Secondary | ICD-10-CM | POA: Diagnosis not present

## 2022-12-10 DIAGNOSIS — R059 Cough, unspecified: Secondary | ICD-10-CM | POA: Diagnosis present

## 2022-12-10 DIAGNOSIS — J181 Lobar pneumonia, unspecified organism: Secondary | ICD-10-CM | POA: Diagnosis not present

## 2022-12-10 DIAGNOSIS — R791 Abnormal coagulation profile: Secondary | ICD-10-CM | POA: Diagnosis not present

## 2022-12-10 DIAGNOSIS — Z7901 Long term (current) use of anticoagulants: Secondary | ICD-10-CM | POA: Diagnosis not present

## 2022-12-10 LAB — CBC WITH DIFFERENTIAL/PLATELET
Abs Immature Granulocytes: 0.05 10*3/uL (ref 0.00–0.07)
Basophils Absolute: 0 10*3/uL (ref 0.0–0.1)
Basophils Relative: 1 %
Eosinophils Absolute: 0.1 10*3/uL (ref 0.0–0.5)
Eosinophils Relative: 1 %
HCT: 38.9 % (ref 36.0–46.0)
Hemoglobin: 12.6 g/dL (ref 12.0–15.0)
Immature Granulocytes: 1 %
Lymphocytes Relative: 16 %
Lymphs Abs: 1.3 10*3/uL (ref 0.7–4.0)
MCH: 29.6 pg (ref 26.0–34.0)
MCHC: 32.4 g/dL (ref 30.0–36.0)
MCV: 91.5 fL (ref 80.0–100.0)
Monocytes Absolute: 0.6 10*3/uL (ref 0.1–1.0)
Monocytes Relative: 7 %
Neutro Abs: 6 10*3/uL (ref 1.7–7.7)
Neutrophils Relative %: 74 %
Platelets: 292 10*3/uL (ref 150–400)
RBC: 4.25 MIL/uL (ref 3.87–5.11)
RDW: 14.1 % (ref 11.5–15.5)
WBC: 8 10*3/uL (ref 4.0–10.5)
nRBC: 0 % (ref 0.0–0.2)

## 2022-12-10 LAB — TROPONIN I (HIGH SENSITIVITY): Troponin I (High Sensitivity): 11 ng/L (ref <18)

## 2022-12-10 LAB — COMPREHENSIVE METABOLIC PANEL
ALT: 42 U/L (ref 0–44)
AST: 24 U/L (ref 15–41)
Albumin: 3.8 g/dL (ref 3.5–5.0)
Alkaline Phosphatase: 106 U/L (ref 38–126)
Anion gap: 11 (ref 5–15)
BUN: 20 mg/dL (ref 8–23)
CO2: 25 mmol/L (ref 22–32)
Calcium: 9.4 mg/dL (ref 8.9–10.3)
Chloride: 100 mmol/L (ref 98–111)
Creatinine, Ser: 1.09 mg/dL — ABNORMAL HIGH (ref 0.44–1.00)
GFR, Estimated: 56 mL/min — ABNORMAL LOW (ref >60)
Glucose, Bld: 93 mg/dL (ref 70–99)
Potassium: 3.7 mmol/L (ref 3.5–5.1)
Sodium: 136 mmol/L (ref 135–145)
Total Bilirubin: 1.3 mg/dL — ABNORMAL HIGH (ref 0.3–1.2)
Total Protein: 7.4 g/dL (ref 6.5–8.1)

## 2022-12-10 LAB — RESP PANEL BY RT-PCR (RSV, FLU A&B, COVID)  RVPGX2
Influenza A by PCR: NEGATIVE
Influenza B by PCR: NEGATIVE
Resp Syncytial Virus by PCR: NEGATIVE
SARS Coronavirus 2 by RT PCR: NEGATIVE

## 2022-12-10 LAB — APTT: aPTT: 25 seconds (ref 24–36)

## 2022-12-10 LAB — PROTIME-INR
INR: 1.2 (ref 0.8–1.2)
Prothrombin Time: 15.5 seconds — ABNORMAL HIGH (ref 11.4–15.2)

## 2022-12-10 MED ORDER — KETOROLAC TROMETHAMINE 15 MG/ML IJ SOLN
15.0000 mg | Freq: Once | INTRAMUSCULAR | Status: AC
  Administered 2022-12-10: 15 mg via INTRAVENOUS
  Filled 2022-12-10: qty 1

## 2022-12-10 MED ORDER — DEXAMETHASONE SODIUM PHOSPHATE 10 MG/ML IJ SOLN
8.0000 mg | Freq: Once | INTRAMUSCULAR | Status: AC
  Administered 2022-12-10: 8 mg via INTRAVENOUS
  Filled 2022-12-10: qty 1

## 2022-12-10 MED ORDER — AZITHROMYCIN 250 MG PO TABS: 250.0000 mg | ORAL_TABLET | Freq: Every day | ORAL | 0 refills | Status: AC

## 2022-12-10 MED ORDER — IPRATROPIUM BROMIDE 0.02 % IN SOLN
0.5000 mg | Freq: Once | RESPIRATORY_TRACT | Status: AC
  Administered 2022-12-10: 0.5 mg via RESPIRATORY_TRACT
  Filled 2022-12-10: qty 2.5

## 2022-12-10 MED ORDER — ALBUTEROL SULFATE (2.5 MG/3ML) 0.083% IN NEBU
10.0000 mg | INHALATION_SOLUTION | Freq: Once | RESPIRATORY_TRACT | Status: AC
  Administered 2022-12-10: 10 mg via RESPIRATORY_TRACT
  Filled 2022-12-10: qty 12

## 2022-12-10 MED ORDER — SODIUM CHLORIDE 0.9 % IV SOLN
500.0000 mg | Freq: Once | INTRAVENOUS | Status: AC
  Administered 2022-12-10: 500 mg via INTRAVENOUS
  Filled 2022-12-10: qty 5

## 2022-12-10 MED ORDER — IOHEXOL 350 MG/ML SOLN
150.0000 mL | Freq: Once | INTRAVENOUS | Status: AC | PRN
  Administered 2022-12-10: 150 mL via INTRAVENOUS

## 2022-12-10 NOTE — ED Provider Notes (Signed)
[place] EMERGENCY DEPARTMENT AT [facility] Provider Note   CSN: [id number] Arrival date & time: 12/10/22  1240     History  Chief Complaint  Patient presents with   Cough    [name] is a 67 y.o. female with past medical history hypertension, hyperlipidemia, type 2 diabetes, DVT on anticoagulation presents to the ED complaining of cough and hemoptysis for the last 10 to 11 days.  She states that she initially started with a nonbloody productive cough and approximately 4 days later it became blood-tinged.  Notes that she is adherent with her medication regimen.  No history of lung disease per patient.  No known sick contacts.  She states that she intermittently feels short of breath secondary to her frequent cough.  She states that she had 2 negative COVID test at home.  States that she has had multiple DVTs before and is adherent with her Xarelto.  States that she has never had a PE.  She has had intermittent chest pain as well that is sometimes present with coughing and sometimes when not coughing.  Nonradiating pain.  No associated symptoms with lightheadedness, dizziness, syncope.       Home Medications Prior to Admission medications   Medication Sig Start Date End Date Taking? Authorizing Provider  azithromycin (ZITHROMAX) 250 MG tablet Take 1 tablet (250 mg total) by mouth daily for 5 days. Take first 2 tablets together, then 1 every day until finished. 12/11/22 12/16/22 Yes Floyed Masoud L, PA-C  acetaminophen (TYLENOL) 650 MG CR tablet Take 650-1,300 mg by mouth every 8 (eight) hours as needed for pain.    [provider]  atorvastatin (LIPITOR) 20 MG tablet Take 40 mg by mouth at bedtime.    [provider]  citalopram (CELEXA) 20 MG tablet Take 1 tablet (20 mg total) by mouth daily. 07/26/20   Just, Azalee Course, FNP  furosemide (LASIX) 40 MG tablet Take 40 mg by mouth daily as needed for edema.    [provider]  hydrochlorothiazide  (HYDRODIURIL) 25 MG tablet Take 1 tablet (25 mg total) by mouth daily. 07/26/20   Just, Azalee Course, FNP  melatonin 5 MG TABS Take 5 mg by mouth at bedtime as needed (sleep).    [provider]  metFORMIN (GLUCOPHAGE) 500 MG tablet Take 500 mg by mouth 2 (two) times daily with a meal.    [provider]  metoprolol succinate (TOPROL-XL) 25 MG 24 hr tablet Take 25 mg by mouth daily. 06/22/20   [provider]  rivaroxaban (XARELTO) 20 MG TABS tablet Take 1 tablet (20 mg total) by mouth daily with supper. 04/19/22   Serena Croissant, MD  valsartan (DIOVAN) 80 MG tablet Take 1 tablet (80 mg total) by mouth daily. Patient taking differently: Take 80 mg by mouth at bedtime. 07/26/20   Just, Azalee Course, FNP      Allergies    Penicillins, Shellfish-derived products, and Morphine and codeine    Review of Systems   Review of Systems  All other systems reviewed and are negative.   Physical Exam Updated Vital Signs BP 134/70   Pulse 91   Temp 98.9 F (37.2 C) (Oral)   Resp (!) 23   Ht 5\' 3"  (1.6 m)   Wt 86.2 kg   SpO2 99%   BMI 33.66 kg/m  Physical Exam Vitals and nursing note reviewed.  Constitutional:      General: She is not in acute distress.  Appearance: Normal appearance.  HENT:     Head: Normocephalic and atraumatic.     Mouth/Throat:     Mouth: Mucous membranes are moist.  Eyes:     Conjunctiva/sclera: Conjunctivae normal.     Comments: Patent airway  Cardiovascular:     Rate and Rhythm: Normal rate and regular rhythm.     Heart sounds: No murmur heard. Pulmonary:     Effort: Pulmonary effort is normal. No respiratory distress.     Breath sounds: No stridor. Wheezing (occasional expiratory) present. No rhonchi or rales.     Comments: Frequent nonproductive cough Abdominal:     General: Abdomen is flat.     Palpations: Abdomen is soft.     Tenderness: There is no abdominal tenderness.  Musculoskeletal:        General: Normal range of motion.      Cervical back: Neck supple.     Right lower leg: No edema.     Left lower leg: No edema.  Skin:    General: Skin is warm and dry.     Capillary Refill: Capillary refill takes less than 2 seconds.     Coloration: Skin is not jaundiced or pale.     Findings: No rash.  Neurological:     General: No focal deficit present.     Mental Status: She is alert and oriented to person, place, and time.  Psychiatric:        Behavior: Behavior normal.     ED Results / Procedures / Treatments   Labs (all labs ordered are listed, but only abnormal results are displayed) Labs Reviewed  COMPREHENSIVE METABOLIC PANEL - Abnormal; Notable for the following components:      Result Value   Creatinine, Ser 1.09 (*)    Total Bilirubin 1.3 (*)    GFR, Estimated 56 (*)    All other components within normal limits  PROTIME-INR - Abnormal; Notable for the following components:   Prothrombin Time 15.5 (*)    All other components within normal limits  RESP PANEL BY RT-PCR (RSV, FLU A&B, COVID)  RVPGX2  CBC WITH DIFFERENTIAL/PLATELET  APTT  QUANTIFERON-TB GOLD PLUS  TROPONIN I (HIGH SENSITIVITY)    EKG EKG Interpretation  Date/Time:  Monday December 10 2022 16:05:59 EDT Ventricular Rate:  93 PR Interval:  140 QRS Duration: 76 QT Interval:  366 QTC Calculation: 455 R Axis:   37 Text Interpretation: Normal sinus rhythm Septal infarct , age undetermined Abnormal ECG similar to Mar 2024 Confirmed by Pricilla Loveless [phone]) on 12/10/2022 7:35:11 PM  Radiology CT Angio Chest PE W and/or Wo Contrast  Result Date: 12/10/2022 CLINICAL DATA:  Pulmonary embolism suspected. EXAM: CT ANGIOGRAPHY CHEST WITH CONTRAST TECHNIQUE: Multidetector CT imaging of the chest was performed using the standard protocol during bolus administration of intravenous contrast. Multiplanar CT image reconstructions and MIPs were obtained to evaluate the vascular anatomy. RADIATION DOSE REDUCTION: This exam was performed according to the  departmental dose-optimization program which includes automated exposure control, adjustment of the mA and/or kV according to patient size and/or use of iterative reconstruction technique. CONTRAST:  OMNIPAQUE IOHEXOL 350 MG/ML SOLN COMPARISON:  07/22/2016 CTA chest FINDINGS: Cardiovascular: The quality of this exam for evaluation of pulmonary embolism is good. The initial bolus was suboptimally timed and exam was repeated. No evidence of pulmonary embolism. Bovine arch. Aortic atherosclerosis. Tortuous thoracic aorta. Mild cardiomegaly with lipomatous hypertrophy of the interatrial septum. No pericardial effusion. Lad coronary artery calcification. Mediastinum/Nodes: No axillary adenopathy.  Increased number and size of mediastinal and hilar nodes. Example 1.0 cm right paratracheal node on 39/10 measured only a few mm on the prior. Right hilar node of 1.3 cm on 52/10 measured 11 mm on the prior. Left infrahilar adenopathy at 1.2 cm on 56/10, increased from 6 mm previously. Lungs/Pleura: No pleural fluid. Right base subsegmental atelectasis. Superior segment right lower lobe 5 mm nodule on 37/11 is similar to 07/22/2016 and considered benign. Ill-defined lingular nodule of 4 mm on 72/11 is new since the remote CT. There is an adjacent possible 4 mm cavitary nodule or area of bronchial wall thickening on 70/11 which is also new. Left lower lobe pulmonary nodules are subpleural in distribution at 3 mm on 85/11 and 7 mm on 88/11. These were present on the prior. There is new ill-defined more medial left lower lobe nodularity including at 8 mm on 90/11 Upper Abdomen: Normal imaged portions of the liver, spleen, stomach, left adrenal gland. A right adrenal 2.7 cm nodule measures 35 HU and was similar in size on 07/22/2016. Musculoskeletal: Mildly nodular breast parenchyma bilaterally. No acute osseous abnormality. Review of the MIP images confirms the above findings. IMPRESSION: 1.  No evidence of pulmonary  embolism. 2. Lingular and left lower lobe ill-defined nodules are new since 2018. Favor minimal infection, including atypical etiologies. Consider antibiotic therapy and CT follow-up at 3-6 months. 3. Borderline/mild thoracic adenopathy, most likely reactive. Recommend attention on follow-up. 4. Right adrenal adenoma . In the absence of clinically indicated signs/symptoms require(s) no independent follow-up. 5. Age advanced coronary artery atherosclerosis. Recommend assessment of coronary risk factors. 6.  Aortic Atherosclerosis (ICD10-I70.0). Electronically Signed   By: Jeronimo Greaves M.D.   On: 12/10/2022 18:33    Procedures Procedures    Medications Ordered in ED Medications  albuterol (PROVENTIL) (2.5 MG/3ML) 0.083% nebulizer solution 10 mg (10 mg Nebulization Given 12/10/22 1933)  azithromycin (ZITHROMAX) 500 mg in sodium chloride 0.9 % 250 mL IVPB (has no administration in time range)  iohexol (OMNIPAQUE) 350 MG/ML injection 150 mL (150 mLs Intravenous Contrast Given 12/10/22 1806)  dexamethasone (DECADRON) injection 8 mg (8 mg Intravenous Given 12/10/22 1929)  ipratropium (ATROVENT) nebulizer solution 0.5 mg (0.5 mg Nebulization Given 12/10/22 1933)  ketorolac (TORADOL) 15 MG/ML injection 15 mg (15 mg Intravenous Given 12/10/22 1932)    ED Course/ Medical Decision Making/ A&P                            Medical Decision Making Amount and/or Complexity of Data Reviewed Labs: ordered. Decision-making details documented in ED Course. Radiology: ordered. Decision-making details documented in ED Course. ECG/medicine tests: ordered. Decision-making details documented in ED Course.  Risk Prescription drug management.   Medical Decision Making:   LATHA STAUNTON is a 67 y.o. female who presented to the ED today with hemoptysis / dyspnea detailed above.    Additional history discussed with patient's family/caregivers.  Complete initial physical exam performed, notably the patient was in NAD.  Frequent cough with expiratory wheezing.  No signs of respiratory distress.  No lower extremity edema.    Reviewed and confirmed nursing documentation for past medical history, family history, social history.    Initial Assessment:   With the patient's presentation, differential diagnosis includes but is not limited to  Cardiac (AHF, pericardial effusion and tamponade, arrhythmias, ischemia, etc) Respiratory (COPD, asthma, pneumonia, pneumothorax, primary pulmonary hypertension, PE/VQ mismatch) Hematological (anemia) Neuromuscular (ALS, Guillain-Barr, etc)  This  is most consistent with an acute complicated illness  Initial Plan:  Screening labs including CBC and Metabolic panel to evaluate for infectious or metabolic etiology of disease.  CTA chest to evaluate for structural/infectious intrathoracic pathology.  EKG to evaluate for cardiac pathology Troponin to evaluate for cardiac etiology Respiratory panel Coags TB testing due to atypical pneumonia on CT scan Symptomatic treatment Objective evaluation as below reviewed   Initial Study Results:   Laboratory  All laboratory results reviewed without evidence of clinically relevant pathology.   Exceptions include: PT 15.5  EKG EKG was reviewed independently.  Normal sinus rhythm. No acute ST-T changes. No STEMI.    Radiology:  All images reviewed independently. Agree with radiology report at this time.   CT Angio Chest PE W and/or Wo Contrast  Result Date: 12/10/2022 CLINICAL DATA:  Pulmonary embolism suspected. EXAM: CT ANGIOGRAPHY CHEST WITH CONTRAST TECHNIQUE: Multidetector CT imaging of the chest was performed using the standard protocol during bolus administration of intravenous contrast. Multiplanar CT image reconstructions and MIPs were obtained to evaluate the vascular anatomy. RADIATION DOSE REDUCTION: This exam was performed according to the departmental dose-optimization program which includes automated exposure control,  adjustment of the mA and/or kV according to patient size and/or use of iterative reconstruction technique. CONTRAST:  OMNIPAQUE IOHEXOL 350 MG/ML SOLN COMPARISON:  07/22/2016 CTA chest FINDINGS: Cardiovascular: The quality of this exam for evaluation of pulmonary embolism is good. The initial bolus was suboptimally timed and exam was repeated. No evidence of pulmonary embolism. Bovine arch. Aortic atherosclerosis. Tortuous thoracic aorta. Mild cardiomegaly with lipomatous hypertrophy of the interatrial septum. No pericardial effusion. Lad coronary artery calcification. Mediastinum/Nodes: No axillary adenopathy. Increased number and size of mediastinal and hilar nodes. Example 1.0 cm right paratracheal node on 39/10 measured only a few mm on the prior. Right hilar node of 1.3 cm on 52/10 measured 11 mm on the prior. Left infrahilar adenopathy at 1.2 cm on 56/10, increased from 6 mm previously. Lungs/Pleura: No pleural fluid. Right base subsegmental atelectasis. Superior segment right lower lobe 5 mm nodule on 37/11 is similar to 07/22/2016 and considered benign. Ill-defined lingular nodule of 4 mm on 72/11 is new since the remote CT. There is an adjacent possible 4 mm cavitary nodule or area of bronchial wall thickening on 70/11 which is also new. Left lower lobe pulmonary nodules are subpleural in distribution at 3 mm on 85/11 and 7 mm on 88/11. These were present on the prior. There is new ill-defined more medial left lower lobe nodularity including at 8 mm on 90/11 Upper Abdomen: Normal imaged portions of the liver, spleen, stomach, left adrenal gland. A right adrenal 2.7 cm nodule measures 35 HU and was similar in size on 07/22/2016. Musculoskeletal: Mildly nodular breast parenchyma bilaterally. No acute osseous abnormality. Review of the MIP images confirms the above findings. IMPRESSION: 1.  No evidence of pulmonary embolism. 2. Lingular and left lower lobe ill-defined nodules are new since 2018. Favor  minimal infection, including atypical etiologies. Consider antibiotic therapy and CT follow-up at 3-6 months. 3. Borderline/mild thoracic adenopathy, most likely reactive. Recommend attention on follow-up. 4. Right adrenal adenoma . In the absence of clinically indicated signs/symptoms require(s) no independent follow-up. 5. Age advanced coronary artery atherosclerosis. Recommend assessment of coronary risk factors. 6.  Aortic Atherosclerosis (ICD10-I70.0). Electronically Signed   By: Jeronimo Greaves M.D.   On: 12/10/2022 18:33   DG HIP UNILAT WITH PELVIS MIN 4 VIEWS LEFT  Result Date: 11/28/2022 CLINICAL DATA:  Left hip pain.  No known injury. EXAM: DG HIP (WITH OR WITHOUT PELVIS) 4+V LEFT COMPARISON:  None Available. FINDINGS: Minimal left hip joint space narrowing and acetabular spurring. The femoral head is well seated in the acetabulum. No fracture. No erosion, evidence of a vascular necrosis or erosion. Right hip arthroplasty is intact. Bony pelvis including pubic rami are intact. Pubic symphysis and sacroiliac joints are congruent with mild degenerative change. IMPRESSION: Mild osteoarthritis of the left hip. Electronically Signed   By: Narda Rutherford M.D.   On: 11/28/2022 22:56   PCV MYOCARDIAL PERFUSION WO LEXISCAN  Result Date: 11/18/2022 Exercise nuclear stress test 11/16/2022: There is breast attenuation artifact noted in the basal to mid anterolateral and apical anterior wall.  No reversible ischemia or scar.  Overall LV systolic function is normal without regional wall motion abnormalities. Stress LV EF: 60%. Normal ECG stress. The patient exercised for 5 minutes and 45 seconds of a Bruce protocol, achieving approximately 5.37 METs & 88% MPHR. The blood pressure response was normal. No previous exam available for comparison. Low risk.      Final Assessment and Plan:   67 year old female presents to the ED with hemoptysis.  States that the cough started 10 to 11 days ago and was initially  nonbloody and became bloody approximately 4 days then.  Associated shortness of breath and chest pain.  History of DVT anticoagulated on Xarelto.  No history of PE.  Reports medication adherence.  Normal hemoglobin.  Normal troponin.  No leukocytosis.  No significant electrolyte disturbance.  Very minimally elevated PT.  Viral swabs negative.  EKG without acute ST-T changes.  Patient reports good glycemic control.  Given one-time dose of Decadron in the ED to help with suspected bronchitis.  CT a chest was obtained for further evaluation due to overall negative workup and hemoptysis in the setting of history of clots.  CTA shows likely atypical pneumonia in the left lower lobe.  Discussed this in detail with patient and sister at bedside.  Following breathing treatment and steroids, patient reports that cough is much improved and her breathing feels better.  Wheezing has nearly resolved.  She is maintaining oxygen saturation on room air.  Nontoxic-appearing.  Discussed possibility of admission versus discharge with patient.  Patient favors going home.  She is tachycardic but this did not begin until following breathing treatment and steroids.  States that this typically happens following these treatments.  No recent antibiotic use.  Patient agreeable to proceed with treatment at home.  Discussed with patient and sister at bedside that I recommend close outpatient follow-up including repeat CT scan in 3 to 6 months to ensure resolution of finding from CT scan today.  Patient agreeable to do so.  Also discussed very strict ED return precautions and patient reiterates multiple times that she will return if symptoms or not improving or worsening.  Sister also agreeable to bring her back as needed.  Otherwise fairly reassuring workup.  Discussed with attending physician who cosigned this note who agreed with azithromycin treatment for likely atypical pneumonia.  First dose given in ED. Strict ED return precautions given,  all questions answered, and patient stable for discharge.   Clinical Impression:  1. Pneumonia of left lower lobe due to infectious organism      Discharge            Final Clinical Impression(s) / ED Diagnoses Final diagnoses:  Pneumonia of left lower lobe due to infectious organism    Rx /  DC Orders ED Discharge Orders          Ordered    azithromycin (ZITHROMAX) 250 MG tablet  Daily        12/10/22 2017              Richardson Dopp 12/10/22 2130    Pricilla Loveless, MD 12/11/22 [phone]

## 2022-12-10 NOTE — ED Notes (Signed)
Pt SAT @ 88, pt has COPD and is sleeping informed RN Lyda Perone.

## 2022-12-10 NOTE — Discharge Instructions (Signed)
Thank you for letting us take care of you today.  Overall, your labs are reassuring.  The CT scan of your chest did show a likely atypical pneumonia.  We gave you a dose of IV antibiotics in the ED and you may start your other antibiotics tomorrow.  Please complete all of these even if you are feeling better.  Please follow-up closely with your PCP for recheck within the next week or sooner for any unimproved symptoms.  We also gave you a dose of steroids in the ED.  This may temporarily raise your blood sugars over the next couple of days.  The radiologist recommended a repeat CT scan in 3 to 6 months to make sure that the area that we discussed is cleared.  Discussed scheduling this at your next PCP appointment.  For any new or worsening symptoms including severe shortness of breath or chest pain, lightheadedness or dizziness, inability to eat or drink, high fevers, large amounts of bleeding, or other new, concerning symptoms, please return to the nearest ED for reevaluation.

## 2022-12-10 NOTE — ED Triage Notes (Addendum)
Reports cough x 11 days with bright red blood in sputum x 7 days.  States she also has bleeding when blowing her nose. Takes a blood thinner.  Reports ribs sore and back pain from coughing.  Also reports headache x 5 days and eyes burning.  Denies any sick contacts.  Hasn't been seen for this illness. 2 negative home COVID test.

## 2022-12-10 NOTE — ED Provider Triage Note (Signed)
Emergency Medicine Provider Triage Evaluation Note  ROSELLE NORTON , a 67 y.o. female  was evaluated in triage.  Pt complains of hemoptysis for the past 11 days.  Patient works that she has been blowing her nose with bright red blood and mucus as well as coughing up bright red bloody mucus.  She reports some shortness of breath and generalized weakness.  Denies any fevers or chills.  Reports she has had 2 negative at-home COVID test.  Denies any abdominal pain, vomiting, or nausea.  Denies any hematuria.  On Xarelto for multiple DVTs but reports compliancy to this.  Review of Systems  Positive:  Negative:   Physical Exam  BP 133/72 (BP Location: Left Arm)   Pulse 85   Temp 98.5 F (36.9 C) (Oral)   Resp 20   SpO2 99%  Gen:   Awake, no distress   Resp:  Normal effort  MSK:   Moves extremities without difficulty  Other:  Lung sounds are diminished bilaterally.  She speaking in full sentences.  Medical Decision Making  Medically screening exam initiated at 3:54 PM.  Appropriate orders placed.  [name] was informed that the remainder of the evaluation will be completed by another provider, this initial triage assessment does not replace that evaluation, and the importance of remaining in the ED until their evaluation is complete.  Discussed this case my attending.  CT angio ordered as well as basic labs.   Achille Rich, [place] 12/10/22 1555

## 2023-05-24 ENCOUNTER — Other Ambulatory Visit: Payer: Self-pay | Admitting: Hematology and Oncology

## 2023-05-31 ENCOUNTER — Telehealth: Payer: Self-pay | Admitting: Hematology and Oncology

## 2023-06-10 ENCOUNTER — Observation Stay (HOSPITAL_COMMUNITY)
Admission: EM | Admit: 2023-06-10 | Discharge: 2023-06-11 | Disposition: A | Discharge status: Home / Self Care | Payer: BC Managed Care – PPO | Attending: Family Medicine | Admitting: Family Medicine

## 2023-06-10 ENCOUNTER — Encounter (HOSPITAL_COMMUNITY): Payer: Self-pay

## 2023-06-10 ENCOUNTER — Emergency Department (HOSPITAL_COMMUNITY): Payer: BC Managed Care – PPO

## 2023-06-10 ENCOUNTER — Other Ambulatory Visit: Payer: Self-pay

## 2023-06-10 DIAGNOSIS — Z7984 Long term (current) use of oral hypoglycemic drugs: Secondary | ICD-10-CM | POA: Diagnosis not present

## 2023-06-10 DIAGNOSIS — R531 Weakness: Secondary | ICD-10-CM | POA: Diagnosis present

## 2023-06-10 DIAGNOSIS — E876 Hypokalemia: Secondary | ICD-10-CM | POA: Diagnosis not present

## 2023-06-10 DIAGNOSIS — R590 Localized enlarged lymph nodes: Secondary | ICD-10-CM | POA: Diagnosis present

## 2023-06-10 DIAGNOSIS — Z1152 Encounter for screening for COVID-19: Secondary | ICD-10-CM | POA: Diagnosis not present

## 2023-06-10 DIAGNOSIS — Z86718 Personal history of other venous thrombosis and embolism: Secondary | ICD-10-CM

## 2023-06-10 DIAGNOSIS — E119 Type 2 diabetes mellitus without complications: Secondary | ICD-10-CM | POA: Diagnosis not present

## 2023-06-10 DIAGNOSIS — Z23 Encounter for immunization: Secondary | ICD-10-CM | POA: Insufficient documentation

## 2023-06-10 DIAGNOSIS — Z79899 Other long term (current) drug therapy: Secondary | ICD-10-CM | POA: Diagnosis not present

## 2023-06-10 DIAGNOSIS — R197 Diarrhea, unspecified: Secondary | ICD-10-CM | POA: Diagnosis not present

## 2023-06-10 DIAGNOSIS — I10 Essential (primary) hypertension: Secondary | ICD-10-CM | POA: Diagnosis present

## 2023-06-10 DIAGNOSIS — Z87891 Personal history of nicotine dependence: Secondary | ICD-10-CM | POA: Insufficient documentation

## 2023-06-10 DIAGNOSIS — R911 Solitary pulmonary nodule: Secondary | ICD-10-CM | POA: Insufficient documentation

## 2023-06-10 DIAGNOSIS — R918 Other nonspecific abnormal finding of lung field: Secondary | ICD-10-CM | POA: Diagnosis present

## 2023-06-10 DIAGNOSIS — R55 Syncope and collapse: Secondary | ICD-10-CM

## 2023-06-10 DIAGNOSIS — R7303 Prediabetes: Secondary | ICD-10-CM | POA: Insufficient documentation

## 2023-06-10 LAB — CBC WITH DIFFERENTIAL/PLATELET
Abs Immature Granulocytes: 0.03 10*3/uL (ref 0.00–0.07)
Basophils Absolute: 0 10*3/uL (ref 0.0–0.1)
Basophils Relative: 1 %
Eosinophils Absolute: 0.1 10*3/uL (ref 0.0–0.5)
Eosinophils Relative: 1 %
HCT: 36.4 % (ref 36.0–46.0)
Hemoglobin: 12.1 g/dL (ref 12.0–15.0)
Immature Granulocytes: 0 %
Lymphocytes Relative: 17 %
Lymphs Abs: 1.2 10*3/uL (ref 0.7–4.0)
MCH: 28.7 pg (ref 26.0–34.0)
MCHC: 33.2 g/dL (ref 30.0–36.0)
MCV: 86.3 fL (ref 80.0–100.0)
Monocytes Absolute: 0.5 10*3/uL (ref 0.1–1.0)
Monocytes Relative: 7 %
Neutro Abs: 5.4 10*3/uL (ref 1.7–7.7)
Neutrophils Relative %: 74 %
Platelets: 244 10*3/uL (ref 150–400)
RBC: 4.22 MIL/uL (ref 3.87–5.11)
RDW: 13.6 % (ref 11.5–15.5)
WBC: 7.3 10*3/uL (ref 4.0–10.5)
nRBC: 0 % (ref 0.0–0.2)

## 2023-06-10 LAB — CBG MONITORING, ED: Glucose-Capillary: 104 mg/dL — ABNORMAL HIGH (ref 70–99)

## 2023-06-10 LAB — TROPONIN I (HIGH SENSITIVITY)
Troponin I (High Sensitivity): 9 ng/L (ref <18)
Troponin I (High Sensitivity): 10 ng/L (ref <18)

## 2023-06-10 LAB — COMPREHENSIVE METABOLIC PANEL
ALT: 17 U/L (ref 0–44)
AST: 18 U/L (ref 15–41)
Albumin: 3.5 g/dL (ref 3.5–5.0)
Alkaline Phosphatase: 85 U/L (ref 38–126)
Anion gap: 8 (ref 5–15)
BUN: 12 mg/dL (ref 8–23)
CO2: 29 mmol/L (ref 22–32)
Calcium: 9.8 mg/dL (ref 8.9–10.3)
Chloride: 100 mmol/L (ref 98–111)
Creatinine, Ser: 0.97 mg/dL (ref 0.44–1.00)
GFR, Estimated: >60 mL/min (ref >60)
Glucose, Bld: 118 mg/dL — ABNORMAL HIGH (ref 70–99)
Potassium: 2.5 mmol/L — CL (ref 3.5–5.1)
Sodium: 137 mmol/L (ref 135–145)
Total Bilirubin: 1.3 mg/dL — ABNORMAL HIGH (ref <1.2)
Total Protein: 6.2 g/dL — ABNORMAL LOW (ref 6.5–8.1)

## 2023-06-10 LAB — HEMOGLOBIN A1C
Hgb A1c MFr Bld: 5.7 % — ABNORMAL HIGH (ref 4.8–5.6)
Mean Plasma Glucose: 116.89 mg/dL

## 2023-06-10 LAB — URINALYSIS, ROUTINE W REFLEX MICROSCOPIC
Bilirubin Urine: NEGATIVE
Glucose, UA: NEGATIVE mg/dL
Ketones, ur: NEGATIVE mg/dL
Leukocytes,Ua: NEGATIVE
Nitrite: NEGATIVE
Protein, ur: NEGATIVE mg/dL
Specific Gravity, Urine: 1.006 (ref 1.005–1.030)
pH: 6 (ref 5.0–8.0)

## 2023-06-10 LAB — BASIC METABOLIC PANEL
Anion gap: 10 (ref 5–15)
BUN: 11 mg/dL (ref 8–23)
CO2: 29 mmol/L (ref 22–32)
Calcium: 9.4 mg/dL (ref 8.9–10.3)
Chloride: 101 mmol/L (ref 98–111)
Creatinine, Ser: 0.89 mg/dL (ref 0.44–1.00)
GFR, Estimated: >60 mL/min (ref >60)
Glucose, Bld: 164 mg/dL — ABNORMAL HIGH (ref 70–99)
Potassium: 2.7 mmol/L — CL (ref 3.5–5.1)
Sodium: 140 mmol/L (ref 135–145)

## 2023-06-10 LAB — RESP PANEL BY RT-PCR (RSV, FLU A&B, COVID)  RVPGX2
Influenza A by PCR: NEGATIVE
Influenza B by PCR: NEGATIVE
Resp Syncytial Virus by PCR: NEGATIVE
SARS Coronavirus 2 by RT PCR: NEGATIVE

## 2023-06-10 LAB — MAGNESIUM
Magnesium: 1.6 mg/dL — ABNORMAL LOW (ref 1.7–2.4)
Magnesium: 2.3 mg/dL (ref 1.7–2.4)

## 2023-06-10 MED ORDER — POTASSIUM CHLORIDE CRYS ER 20 MEQ PO TBCR
20.0000 meq | EXTENDED_RELEASE_TABLET | Freq: Once | ORAL | Status: AC
  Administered 2023-06-10: 20 meq via ORAL
  Filled 2023-06-10: qty 1

## 2023-06-10 MED ORDER — PNEUMOCOCCAL 20-VAL CONJ VACC 0.5 ML IM SUSY
0.5000 mL | PREFILLED_SYRINGE | INTRAMUSCULAR | Status: AC
  Administered 2023-06-11: 0.5 mL via INTRAMUSCULAR
  Filled 2023-06-10 (×2): qty 0.5

## 2023-06-10 MED ORDER — INFLUENZA VAC A&B SURF ANT ADJ 0.5 ML IM SUSY
0.5000 mL | PREFILLED_SYRINGE | INTRAMUSCULAR | Status: AC
  Administered 2023-06-11: 0.5 mL via INTRAMUSCULAR
  Filled 2023-06-10 (×2): qty 0.5

## 2023-06-10 MED ORDER — MELATONIN 5 MG PO TABS
5.0000 mg | ORAL_TABLET | Freq: Every evening | ORAL | Status: DC | PRN
Start: 2023-06-10 — End: 2023-06-11
  Administered 2023-06-10: 5 mg via ORAL
  Filled 2023-06-10: qty 1

## 2023-06-10 MED ORDER — POTASSIUM CHLORIDE 10 MEQ/100ML IV SOLN
10.0000 meq | INTRAVENOUS | Status: AC
  Administered 2023-06-10 – 2023-06-11 (×4): 10 meq via INTRAVENOUS
  Filled 2023-06-10 (×4): qty 100

## 2023-06-10 MED ORDER — RIVAROXABAN 10 MG PO TABS
20.0000 mg | ORAL_TABLET | Freq: Every day | ORAL | Status: DC
  Administered 2023-06-10: 20 mg via ORAL
  Filled 2023-06-10: qty 2

## 2023-06-10 MED ORDER — METOPROLOL SUCCINATE ER 25 MG PO TB24
25.0000 mg | ORAL_TABLET | Freq: Every day | ORAL | Status: DC
Start: 2023-06-11 — End: 2023-06-11
  Administered 2023-06-11: 25 mg via ORAL
  Filled 2023-06-10: qty 1

## 2023-06-10 MED ORDER — ATORVASTATIN CALCIUM 40 MG PO TABS
40.0000 mg | ORAL_TABLET | Freq: Every day | ORAL | Status: DC
  Administered 2023-06-10: 40 mg via ORAL
  Filled 2023-06-10: qty 1

## 2023-06-10 MED ORDER — POTASSIUM CHLORIDE CRYS ER 20 MEQ PO TBCR
40.0000 meq | EXTENDED_RELEASE_TABLET | Freq: Four times a day (QID) | ORAL | Status: AC
  Administered 2023-06-10 (×2): 40 meq via ORAL
  Filled 2023-06-10 (×2): qty 2

## 2023-06-10 MED ORDER — CITALOPRAM HYDROBROMIDE 20 MG PO TABS
20.0000 mg | ORAL_TABLET | Freq: Every day | ORAL | Status: DC
  Administered 2023-06-11: 20 mg via ORAL
  Filled 2023-06-10: qty 1

## 2023-06-10 MED ORDER — IRBESARTAN 75 MG PO TABS
75.0000 mg | ORAL_TABLET | Freq: Every day | ORAL | Status: DC
  Administered 2023-06-10 – 2023-06-11 (×2): 75 mg via ORAL
  Filled 2023-06-10 (×3): qty 1

## 2023-06-10 MED ORDER — MAGNESIUM SULFATE 2 GM/50ML IV SOLN
2.0000 g | Freq: Once | INTRAVENOUS | Status: AC
  Administered 2023-06-10: 2 g via INTRAVENOUS
  Filled 2023-06-10: qty 50

## 2023-06-10 MED ORDER — POTASSIUM CHLORIDE 10 MEQ/100ML IV SOLN
10.0000 meq | Freq: Once | INTRAVENOUS | Status: AC
  Administered 2023-06-10: 10 meq via INTRAVENOUS
  Filled 2023-06-10: qty 100

## 2023-06-10 NOTE — Assessment & Plan Note (Addendum)
Favor IBS-D and metformin-related GI upset as most likely etiologies.  No known abx use in 3 months.  No NAGMA noted on labs.  Well hydrated on exam and tolerating PO intake. Unable to see records for colonoscopy.  -Consider GI consult if persistent

## 2023-06-10 NOTE — ED Provider Triage Note (Signed)
Emergency Medicine Provider Triage Evaluation Note  [name] , a 67 y.o. female  was evaluated in triage.  Pt complains of near syncope.  Review of Systems  Positive: Dizziness, nausea, lightheaded Negative: Chest pain, shortness of breath  Physical Exam  BP (!) 169/74 (BP Location: Right Arm)   Pulse 74   Temp 98.5 F (36.9 C) (Oral)   Resp 18   SpO2 97%  Gen:   Awake, no distress   Resp:  Normal effort  MSK:   Moves extremities without difficulty   Medical Decision Making  Medically screening exam initiated at 10:59 AM.  Appropriate orders placed.  [name] was informed that the remainder of the evaluation will be completed by another provider, this initial triage assessment does not replace that evaluation, and the importance of remaining in the ED until their evaluation is complete.  Patient presents after near syncopal symptoms.  She woke up in her normal state of health.  While at work, she experienced lightheadedness, dizziness, nausea.  She continues to feel what she describes as a brain fogginess.  She denies any new medications.   [name], MD 06/10/23 1100

## 2023-06-10 NOTE — ED Provider Notes (Signed)
[name] Provider Note   CSN: [id number] Arrival date & time: 06/10/23  1043     History  Chief Complaint  Patient presents with   Eye Problem    [name] is a 67 y.o. female presenting to the ED with complaint of lightheadedness and general fatigue.  Patient reports that she woke up in her normal state of health today and went to work and then very abruptly had a sensation to [name] of her entire body of lightheadedness, fatigue, feeling like she was going to pass out, weakness in her arms and legs.  She says she had a similar symptom nearly a year ago when she was diagnosed with COVID admitted to the hospital.  She denies vertigo.  She denies chest pain or pressure.  She denies fevers or chills.  She reports some mild nausea to me.  She denies visual changes or loss to me.  She does report a history of hypokalemia and says that she thought this was due to her Lasix which was discontinued.  She is on HCTZ.  She says she has daily diarrhea due to taking metformin.  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 Authorizing Provider  acetaminophen (TYLENOL) 650 MG CR tablet Take 650-1,300 mg by mouth every 8 (eight) hours as needed for pain.   Yes [provider]  atorvastatin (LIPITOR) 20 MG tablet Take 40 mg by mouth at bedtime.   Yes [provider]  citalopram (CELEXA) 20 MG tablet Take 1 tablet (20 mg total) by mouth daily. 07/26/20  Yes Just, Azalee Course, FNP  hydrochlorothiazide (HYDRODIURIL) 25 MG tablet Take 1 tablet (25 mg total) by mouth daily. 07/26/20  Yes Just, Azalee Course, FNP  melatonin 5 MG TABS Take 5 mg by mouth at bedtime as needed (sleep).   Yes [provider]  metFORMIN (GLUCOPHAGE) 500 MG tablet Take 500 mg by mouth 2 (two) times  daily with a meal.   Yes [provider]  metoprolol succinate (TOPROL-XL) 25 MG 24 hr tablet Take 25 mg by mouth daily. 06/22/20  Yes [provider]  valsartan (DIOVAN) 80 MG tablet Take 1 tablet (80 mg total) by mouth daily. Patient taking differently: Take 80 mg by mouth at bedtime. 07/26/20  Yes Just, Azalee Course, FNP  XARELTO 20 MG TABS tablet TAKE 1 TABLET BY MOUTH ONCE DAILY WITH SUPPER 05/24/23  Yes Serena Croissant, MD      Allergies    Penicillins, Shellfish-derived products, and Morphine and codeine    Review of Systems   Review of Systems  Physical Exam Updated Vital Signs BP (!) 170/79   Pulse 69   Temp 98.5 F (36.9 C) (Oral)   Resp 11   SpO2 99%  Physical Exam Constitutional:      General: She is not in acute distress. HENT:     Head: Normocephalic and atraumatic.  Eyes:     Conjunctiva/sclera: Conjunctivae normal.     Pupils: Pupils are equal, round, and reactive to light.  Cardiovascular:     Rate and Rhythm: Normal rate and regular rhythm.  Pulmonary:     Effort: Pulmonary effort is normal. No respiratory distress.  Abdominal:     General: There is no distension.     Tenderness:  There is no abdominal tenderness.  Skin:    General: Skin is warm and dry.  Neurological:     General: No focal deficit present.     Mental Status: She is alert and oriented to person, place, and time. Mental status is at baseline.     Comments: Chronic right-sided facial droop as noted in history; no acute cranial nerve deficits noted 4/5 strength in the upper lower extremities, no sensation deficits  Psychiatric:        Mood and Affect: Mood normal.        Behavior: Behavior normal.     ED Results / Procedures / Treatments   Labs (all labs ordered are listed, but only abnormal results are displayed) Labs Reviewed  COMPREHENSIVE METABOLIC PANEL - Abnormal; Notable for the following components:      Result Value   Potassium 2.5 (*)    Glucose, Bld 118 (*)     Total Protein 6.2 (*)    Total Bilirubin 1.3 (*)    All other components within normal limits  URINALYSIS, ROUTINE W REFLEX MICROSCOPIC - Abnormal; Notable for the following components:   Color, Urine STRAW (*)    Hgb urine dipstick SMALL (*)    Bacteria, UA RARE (*)    All other components within normal limits  MAGNESIUM - Abnormal; Notable for the following components:   Magnesium 1.6 (*)    All other components within normal limits  CBG MONITORING, ED - Abnormal; Notable for the following components:   Glucose-Capillary 104 (*)    All other components within normal limits  RESP PANEL BY RT-PCR (RSV, FLU A&B, COVID)  [id number]  CBC WITH DIFFERENTIAL/PLATELET  TROPONIN I (HIGH SENSITIVITY)  TROPONIN I (HIGH SENSITIVITY)    EKG EKG Interpretation Date/Time:  Monday June 10 2023 11:08:59 EST Ventricular Rate:  75 PR Interval:  146 QRS Duration:  78 QT Interval:  386 QTC Calculation: 431 R Axis:   14  Text Interpretation: Normal sinus rhythm Minimal voltage criteria for LVH, may be normal variant ( R in aVL ) When compared with ECG of 10-Dec-2022 16:05, PREVIOUS ECG IS PRESENT No significant changes Confirmed by Alvester Chou [phone]) on 06/10/2023 12:47:05 PM  Radiology DG Chest 2 View Result Date: 06/10/2023 CLINICAL DATA:  Near-syncope. EXAM: CHEST - 2 VIEW COMPARISON:  Chest radiograph dated July 22, 2016. FINDINGS: The heart size and mediastinal contours are within normal limits. No focal consolidation, pleural effusion, or pneumothorax. No acute osseous abnormality. IMPRESSION: No acute cardiopulmonary findings. Electronically Signed   By: Hart Robinsons M.D.   On: 06/10/2023 11:43   CT HEAD WO CONTRAST Result Date: 06/10/2023 CLINICAL DATA:  Foggy sensation, syncope EXAM: CT HEAD WITHOUT CONTRAST TECHNIQUE: Contiguous axial images were obtained from the base of the skull through the vertex without intravenous contrast. RADIATION DOSE REDUCTION: This exam was  performed according to the departmental dose-optimization program which includes automated exposure control, adjustment of the mA and/or kV according to patient size and/or use of iterative reconstruction technique. COMPARISON:  09/04/2022 FINDINGS: Brain: No acute infarct or hemorrhage. Lateral ventricles and midline structures are unremarkable. No acute extra-axial fluid collections. No mass effect. Vascular: No hyperdense vessel or unexpected calcification. Skull: Normal. Negative for fracture or focal lesion. Sinuses/Orbits: No acute finding. Other: None. IMPRESSION: 1. Stable head CT, no acute intracranial process. Electronically Signed   By: Sharlet Salina M.D.   On: 06/10/2023 11:36    Procedures .Critical Care  Performed by: Terald Sleeper, MD  Authorized by: Terald Sleeper, MD   Critical care provider statement:    Critical care time (minutes):  45   Critical care time was exclusive of:  Separately billable procedures and treating other patients   Critical care was necessary to treat or prevent imminent or life-threatening deterioration of the following conditions:  Metabolic crisis   Critical care was time spent personally by me on the following activities:  Ordering and performing treatments and interventions, ordering and review of laboratory studies, ordering and review of radiographic studies, pulse oximetry, review of old charts, examination of patient and evaluation of patient's response to treatment Comments:     Hypokalemia repletion     Medications Ordered in ED Medications  magnesium sulfate IVPB 2 g 50 mL (has no administration in time range)  potassium chloride 10 mEq in 100 mL IVPB (10 mEq Intravenous New Bag/Given 06/10/23 1338)  potassium chloride SA (KLOR-CON M) CR tablet 20 mEq (20 mEq Oral Given 06/10/23 1329)    ED Course/ Medical Decision Making/ A&P Clinical Course as of 06/10/23 1459  Mon Jun 10, 2023  1412 Admitted to family medicine service [MT]     Clinical Course User Index [MT] Terald Sleeper, MD                                 Medical Decision Making Risk Prescription drug management. Decision regarding hospitalization.   This patient presents to the ED with concern for generalized fatigue, exhaustion,. This involves an extensive number of treatment options, and is a complaint that carries with it a high risk of complications and morbidity.  The differential diagnosis includes electrolyte derangement versus acute anemia versus infection or viral illness versus other  No chest pain or pressure to suggest PE or ACS.  No hypoxia.  No tachycardia.  Co-morbidities that complicate the patient evaluation: History of kalemia and on continued HCTZ at high risk of electrolyte derangement  Additional history obtained from the patient sister at bedside   I ordered and personally interpreted labs.  The pertinent results include: Low potassium and magnesium levels  I ordered imaging studies including CT head, x-ray of the chest I independently visualized and interpreted imaging which showed no emergent findings I agree with the radiologist interpretation  The patient was maintained on a cardiac monitor.  I personally viewed and interpreted the cardiac monitored which showed an underlying rhythm of: Sinus rhythm  Per my interpretation the patient's ECG shows normal sinus rhythm with normal QTc interval  I ordered medication including IV and oral potassium, IV magnesium for repletion  I have reviewed the patients home medicines and have made adjustments as needed  Test Considered: Low suspicion for acute PE, meningitis, stroke  After the interventions noted above, I reevaluated the patient and found that they have: stayed the same   Dispostion:  After consideration of the diagnostic results and the patients response to treatment, I feel that the patent would benefit from medical admission  Question possibility of hypokalemic  periodic paralysis versus viral illness        Final Clinical Impression(s) / ED Diagnoses Final diagnoses:  Hypokalemia  Hypomagnesemia  Near syncope    Rx / DC Orders ED Discharge Orders     None         Janya Eveland, Kermit Balo, MD 06/10/23 1500

## 2023-06-10 NOTE — Plan of Care (Signed)
FMTS Interim Progress Note  S:Night rounded w/ Dr Velna Ochs.  Patient reports that she still feels "foggy".  Per sister at bedside, patient still looks like she is off from baseline.   O: BP (!) 156/75 (BP Location: Right Arm)   Pulse 70   Temp 97.6 F (36.4 C) (Oral)   Resp 18   Ht 5\' 3"  (1.6 m)   Wt 72.8 kg   SpO2 97%   BMI 28.43 kg/m   General: Alert, pleasant, well-appearing woman.  Standing at sink washing hand, able to walk over to bed and sit down.  NAD. HEENT: NCAT. MMM. CV: RRR, no murmurs. Resp: CTAB, no wheezing or crackles. Normal WOB on RA.  Ext: Moves all ext spontaneously Skin: Warm, well perfused   A/P: Hypokalemia -Repeat K2.7, currently receiving IV K x6 and PO . Is tolerating PO. - Check BMP q6h - f/u renin/aldo labs - rest of plan per day team  Lincoln Brigham, MD 06/10/2023, 8:18 PM PGY-2, [facility] pager [phone]

## 2023-06-10 NOTE — Assessment & Plan Note (Addendum)
A1c 6.2 in March 2024.  On metformin 500 mg BID, but suspect this may be contributing to diarrhea in the setting of IBS-D. Metformin may need to be discontinued indefinitely. -Hold Metformin. -Follow up A1c -Follow serum glucose on serial BMPs -Order CBGs and SSI if A1c is in diabetic range

## 2023-06-10 NOTE — ED Notes (Signed)
ED TO INPATIENT HANDOFF REPORT  ED Nurse Name and Phone #:  Theophilus Bones [phone]  S Name/Age/Gender [name] 67 y.o. female Room/Bed: 039C/039C  Code Status   Code Status: Full Code  Home/SNF/Other Home Patient oriented to: self, place, time, and situation Is this baseline? Yes   Triage Complete: Triage complete  Chief Complaint Hypokalemia [E87.6]  Triage Note Patient arrived by [facility] from her work with complaint of feeling foggy today and feeling something not right, no pain, no headache. Vision clear and speech clear. Ems reports CBG 180. Alert and oriented   Allergies Allergies  Allergen Reactions   Penicillins Shortness Of Breath, Swelling and Other (See Comments)    Tongue swelling    Shellfish-Derived Products Shortness Of Breath and Swelling    Tongue swelling    Morphine And Codeine Itching and Other (See Comments)    Hallucinations     Level of Care/Admitting Diagnosis ED Disposition     ED Disposition  Admit   Condition  --   Comment  Hospital Area: [facility] [[id number]]  Level of Care: Telemetry Medical [104]  May place patient in observation at [facility] or [facility] if equivalent level of care is available:: No  Covid Evaluation: Asymptomatic - no recent exposure (last 10 days) testing not required  Diagnosis: Hypokalemia [172180]  Admitting Physician: Nelia Shi [[id number]]  Attending Physician: Acquanetta Belling D [[id number]]          B Medical/Surgery History Past Medical History:  Diagnosis Date   Anxiety    Colitis 08/2001   DVT (deep venous thrombosis) (HCC)    RLE 06/2016, 08/2020   Headache    has tunnel vision when she has a headache   History of blood transfusion    when twins were born.   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   Pneumonia    Pre-diabetes    Past Surgical History:  Procedure Laterality Date   ABDOMINAL  HYSTERECTOMY     BREAST LUMPECTOMY     BUNIONECTOMY     C Section     PAROTIDECTOMY Left 08/20/2022   Procedure: LEFT SUPERFICIAL PAROTIDECTOMY WITH FACIAL NERVE DISSECTION;  Surgeon: Serena Colonel, MD;  Location: [facility] OR;  Service: ENT;  Laterality: Left;  RNFA   TOTAL HIP ARTHROPLASTY Right 08/30/2021   Procedure: RIGHT TOTAL HIP ARTHROPLASTY ANTERIOR APPROACH;  Surgeon: Tarry Kos, MD;  Location: [facility] OR;  Service: Orthopedics;  Laterality: Right;     A IV Location/Drains/Wounds Patient Lines/Drains/Airways Status     Active Line/Drains/Airways     Name Placement date Placement time Site Days   Peripheral IV 06/10/23 20 G Anterior;Left Forearm 06/10/23  1030  Forearm  less than 1            Intake/Output Last 24 hours No intake or output data in the 24 hours ending 06/10/23 1523  Labs/Imaging Results for orders placed or performed during the hospital encounter of 06/10/23 (from the past 48 hours)  CBC WITH DIFFERENTIAL     Status: None   Collection Time: 06/10/23 10:59 AM  Result Value Ref Range   WBC 7.3 4.0 - 10.5 K/uL   RBC 4.22 3.87 - 5.11 MIL/uL   Hemoglobin 12.1 12.0 - 15.0 g/dL   HCT 28.4 13.2 - 44.0 %   MCV 86.3 80.0 - 100.0 fL   MCH 28.7 26.0 - 34.0 pg  MCHC 33.2 30.0 - 36.0 g/dL   RDW 95.6 38.7 - 56.4 %   Platelets 244 150 - 400 K/uL   nRBC 0.0 0.0 - 0.2 %   Neutrophils Relative % 74 %   Neutro Abs 5.4 1.7 - 7.7 K/uL   Lymphocytes Relative 17 %   Lymphs Abs 1.2 0.7 - 4.0 K/uL   Monocytes Relative 7 %   Monocytes Absolute 0.5 0.1 - 1.0 K/uL   Eosinophils Relative 1 %   Eosinophils Absolute 0.1 0.0 - 0.5 K/uL   Basophils Relative 1 %   Basophils Absolute 0.0 0.0 - 0.1 K/uL   Immature Granulocytes 0 %   Abs Immature Granulocytes 0.03 0.00 - 0.07 K/uL    Comment: Performed at [facility], [address], [place], [place] [address]  Comprehensive metabolic panel     Status: Abnormal   Collection Time: 06/10/23 10:59 AM  Result Value Ref Range    Sodium 137 135 - 145 mmol/L   Potassium 2.5 (LL) 3.5 - 5.1 mmol/L    Comment: CRITICAL RESULT CALLED TO, READ BACK BY AND VERIFIED WITH Randall Hiss RN ,  @1238 , 06/10/23, [place], T.   Chloride 100 98 - 111 mmol/L   CO2 29 22 - 32 mmol/L   Glucose, Bld 118 (H) 70 - 99 mg/dL    Comment: Glucose reference range applies only to samples taken after fasting for at least 8 hours.   BUN 12 8 - 23 mg/dL   Creatinine, Ser 1.88 0.44 - 1.00 mg/dL   Calcium 9.8 8.9 - 41.6 mg/dL   Total Protein 6.2 (L) 6.5 - 8.1 g/dL   Albumin 3.5 3.5 - 5.0 g/dL   AST 18 15 - 41 U/L   ALT 17 0 - 44 U/L   Alkaline Phosphatase 85 38 - 126 U/L   Total Bilirubin 1.3 (H) <1.2 mg/dL   GFR, Estimated >60 >63 mL/min    Comment: (NOTE) Calculated using the CKD-EPI Creatinine Equation (2021)    Anion gap 8 5 - 15    Comment: Performed at [facility], [address], [place], [place] [address]  Urinalysis, Routine w reflex microscopic -Urine, Clean Catch     Status: Abnormal   Collection Time: 06/10/23 10:59 AM  Result Value Ref Range   Color, Urine STRAW (A) YELLOW   APPearance CLEAR CLEAR   Specific Gravity, Urine 1.006 1.005 - 1.030   pH 6.0 5.0 - 8.0   Glucose, UA NEGATIVE NEGATIVE mg/dL   Hgb urine dipstick SMALL (A) NEGATIVE   Bilirubin Urine NEGATIVE NEGATIVE   Ketones, ur NEGATIVE NEGATIVE mg/dL   Protein, ur NEGATIVE NEGATIVE mg/dL   Nitrite NEGATIVE NEGATIVE   Leukocytes,Ua NEGATIVE NEGATIVE   RBC / HPF 0-5 0 - 5 RBC/hpf   WBC, UA 0-5 0 - 5 WBC/hpf   Bacteria, UA RARE (A) NONE SEEN   Squamous Epithelial / HPF 0-5 0 - 5 /HPF   Mucus PRESENT    Hyaline Casts, UA PRESENT     Comment: Performed at [facility], [address], [place], [place] [address]  Magnesium     Status: Abnormal   Collection Time: 06/10/23 10:59 AM  Result Value Ref Range   Magnesium 1.6 (L) 1.7 - 2.4 mg/dL    Comment: Performed at [facility], [address], [place], [place] [address]  Troponin I (High  Sensitivity)     Status: None   Collection Time: 06/10/23 10:59 AM  Result Value Ref Range   Troponin I (High Sensitivity) 9 <18 ng/L    Comment: (NOTE) Elevated high sensitivity troponin I (hsTnI) values and significant  changes across serial measurements may suggest ACS but many other  chronic and acute conditions are known to elevate hsTnI results.  Refer to the "Links" section for chest pain algorithms and additional  guidance. Performed at [facility], [address] [place], [place] [address]   CBG monitoring, ED     Status: Abnormal   Collection Time: 06/10/23 11:12 AM  Result Value Ref Range   Glucose-Capillary 104 (H) 70 - 99 mg/dL    Comment: Glucose reference range applies only to samples taken after fasting for at least 8 hours.   Comment 1 Notify RN    Comment 2 Document in Chart   Troponin I (High Sensitivity)     Status: None   Collection Time: 06/10/23  1:35 PM  Result Value Ref Range   Troponin I (High Sensitivity) 10 <18 ng/L    Comment: (NOTE) Elevated high sensitivity troponin I (hsTnI) values and significant  changes across serial measurements may suggest ACS but many other  chronic and acute conditions are known to elevate hsTnI results.  Refer to the "Links" section for chest pain algorithms and additional  guidance. Performed at [facility], [address], [place], [place] [address]    DG Chest 2 View Result Date: 06/10/2023 CLINICAL DATA:  Near-syncope. EXAM: CHEST - 2 VIEW COMPARISON:  Chest radiograph dated July 22, 2016. FINDINGS: The heart size and mediastinal contours are within normal limits. No focal consolidation, pleural effusion, or pneumothorax. No acute osseous abnormality. IMPRESSION: No acute cardiopulmonary findings. Electronically Signed   By: Hart Robinsons M.D.   On: 06/10/2023 11:43   CT HEAD WO CONTRAST Result Date: 06/10/2023 CLINICAL DATA:  Foggy sensation, syncope EXAM: CT HEAD WITHOUT CONTRAST TECHNIQUE:  Contiguous axial images were obtained from the base of the skull through the vertex without intravenous contrast. RADIATION DOSE REDUCTION: This exam was performed according to the departmental dose-optimization program which includes automated exposure control, adjustment of the mA and/or kV according to patient size and/or use of iterative reconstruction technique. COMPARISON:  09/04/2022 FINDINGS: Brain: No acute infarct or hemorrhage. Lateral ventricles and midline structures are unremarkable. No acute extra-axial fluid collections. No mass effect. Vascular: No hyperdense vessel or unexpected calcification. Skull: Normal. Negative for fracture or focal lesion. Sinuses/Orbits: No acute finding. Other: None. IMPRESSION: 1. Stable head CT, no acute intracranial process. Electronically Signed   By: Sharlet Salina M.D.   On: 06/10/2023 11:36    Pending Labs Unresulted Labs (From admission, onward)     Start     Ordered   06/11/23 0500  CBC  Tomorrow morning,   R        06/10/23 1522   06/10/23 1518  Hemoglobin A1c  Once,   R        06/10/23 1522   06/10/23 1517  Basic metabolic panel  Now then every 6 hours,   R (with TIMED occurrences)      06/10/23 1522   06/10/23 1517  Magnesium  Now then every 6 hours,   R (with TIMED occurrences)      06/10/23 1522   06/10/23 1247  Resp panel by RT-PCR (RSV, Flu A&B, Covid) Anterior Nasal Swab  Once,   URGENT        06/10/23 1246  Vitals/Pain Today's Vitals   06/10/23 1047 06/10/23 1054 06/10/23 1315  BP: (!) 169/74  (!) 170/79  Pulse: 74  69  Resp: 18  11  Temp: 98.5 F (36.9 C)    TempSrc: Oral    SpO2: 97%  99%  PainSc:  0-No pain     Isolation Precautions No active isolations  Medications Medications  magnesium sulfate IVPB 2 g 50 mL (has no administration in time range)  atorvastatin (LIPITOR) tablet 40 mg (has no administration in time range)  citalopram (CELEXA) tablet 20 mg (has no administration in time range)   rivaroxaban (XARELTO) tablet 20 mg (has no administration in time range)  melatonin tablet 5 mg (has no administration in time range)  metoprolol succinate (TOPROL-XL) 24 hr tablet 25 mg (has no administration in time range)  irbesartan (AVAPRO) tablet 75 mg (has no administration in time range)  potassium chloride 10 mEq in 100 mL IVPB (0 mEq Intravenous Stopped 06/10/23 1520)  potassium chloride SA (KLOR-CON M) CR tablet 20 mEq (20 mEq Oral Given 06/10/23 1329)    Mobility walks     Focused Assessments Optical   R Recommendations: See Admitting Provider Note  Report given to:   Additional Notes:

## 2023-06-10 NOTE — H&P (Cosign Needed Addendum)
Hospital Admission History and Physical Service Pager: [phone]  Patient name: [name] Medical record number: [id number] Date of Birth: Jun 16, 1956 Age: 67 y.o. Gender: female  Primary Care Provider: Rometta Emery, MD Consultants: None Code Status: FULL CODE, which was confirmed with patient at bedside Preferred Emergency Contact: Marlana Salvage Information     Name Relation Home Work Mobile   [facility] Daughter [phone]  [phone]      Other Contacts     Name Relation Home Work [place] Sister [phone]  [phone]      Chief Complaint: Generalized weakness and mind fog  Assessment and Plan: [name] is a 67 y.o. female presenting with acute onset generalized weakness, found to be hypokalemic and hypomagnesemic.  In all likelihood, the patient's symptoms are due to hypokalemia, though also considered vasovagal and orthostatic syncope as well as hypotension or hypoglycemia.  Will obtain orthostatic vitals once patient's potassium is replenished, and do not favor vasovagal syncope given lack of prodrome and persistent weakness after near syncope event.  Given that she is hypertensive on admission, do not suspect hypotension as a significant contributor.  Glucose has remained elevated despite not having any oral intake since arrival in ED.  Differential for this patient's hypokalemia includes volume loss 2/2 frequent diarrhea, HCTZ use, or poor PO intake.  A combination of these factors is likely.  Will discontinue hydrochlorothiazide, replenish potassium and magnesium, and follow diarrhea closely.  Do not suspect C. diff at this time, as the patient's last known antibiotics exposure was in June and she has no other risk factors.  More likely is exacerbation of IBS-D in the setting of GI upset secondary to metformin use given the timeline of worsening symptoms coinciding with initiation of metformin. Assessment &  Plan Hypokalemia Likely secondary to profuse diarrhea and hydrochlorothiazide use. -Admit to FMTS, attending Dr. McDiarmid, Med Tele unit -Continuous cardiac monitoring x24 hours in setting of electrolyte derangements -Orthostatic vitals tomorrow once electrolytes replenished -Encourage PO intake as patient is toleration oral fluids and does not need IVF at this time -KCl PO 40 mEq x 2, already s/p 20 mEq PO and 10 mEq x2 -BMP and magnesium Q6h for 3 occurrences to ensure normalization of electrolytes Hypertension Poorly controlled on HCTZ, valsartan, metoprolol.  Will likely benefit from adding spironolactone for potassium sparing properties and resistant hypertension when discontinuing HCTZ and its potassium wasting effect. -Hold HCTZ 25 mg daily -Continue valsartan 80 mg (irbesartan 75 mg formulary equivalent), metoprolol succinate 25 mg daily Diarrhea Favor IBS-D and metformin-related GI upset as most likely etiologies.  No known abx use in 3 months.  No NAGMA noted on labs.  Well hydrated on exam and tolerating PO intake. Unable to see records for colonoscopy.  -Consider GI consult if persistent Prediabetes A1c 6.2 in March 2024.  On metformin 500 mg BID, but suspect this may be contributing to diarrhea in the setting of IBS-D. Metformin may need to be discontinued indefinitely. -Hold Metformin. -Follow up A1c -Follow serum glucose on serial BMPs -Order CBGs and SSI if A1c is in diabetic range  Chronic and stable: HLD: Continue atorvastatin 40 mg QHS Anxiety: Continue citalopram 20 mg daily Hx of DVT: Continue Xarelto 20mg  daily  FEN/GI: Regular diet, no IVF at this time. VTE Prophylaxis: Xarelto 20 mg daily  Disposition: Med Tele  History of Present Illness: [name] is a 67 y.o. female with a pertinent PMH of HTN, HLD, Hx DVT  on Xarelto, and anxiety presenting with generalized weakness and fatigue.  Patient is accompanied by her sister.  Of note, patient was  admitted for syncope in March 2024.  At that time, syncope was believed to be vasovagal in nature, as she was not orthostatic and EEG as well as echocardiogram were negative.  Was mildly hypokalemic at the time and electrolytes were replenished.  This morning, patient presented to work at [employer] and suddenly felt she was going to vomit.  She went to the bathroom and when she came back became dizzy/lightheaded.  Denies prodromal symptoms.  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  Glucose was 183 and does not recall BP.  Denies visual changes, LOC, trauma (today or recent).  States she was feeling normal this morning.  Denies fevers, chills, aches.  She drinks water regularly and has had normal appetite.  She took her medications this morning and is not taking Lasix.  She does have regular diarrhea (10-20 episodes/day).  Denies associated nausea and vomiting.  She states this has been happening for a prolonged period of time, but appears to have exacerbated approximately 13 months ago when she began taking metformin.  She has not had colonoscopy for ~17 years.  She endorses diagnosis of IBS-D by gastroenterologist [name] many years ago.  In the ED, CMP showed K 2.5 and magnesium was 1.6.  Repleted with mag sulfate 2 g IV as well as KCl 20 mEq PO and 10 mEq IV x2.  Troponin WNL.  EKG unremarkable with normal QTc.  CT head without acute pathology.  CXR normal and RPP pending.  FMTS consulted for admission for electrolyte derangements and persistent weakness.  On FMTS evaluation, patient endorses brain fog, generalized weakness.  States she feels somewhat confused and tired.  No other significant symptoms and has not had a bowel movement since this morning.  Endorses eating and drinking today.  Review Of Systems: Per HPI.  Pertinent Past Medical History: -DVT 2018, 2022 on Xarelto -HTN -Headache -Anxiety -L parotid gland oncotic  cystadenoma 2024 -Hypokalemia -Pre-diabetes  Remainder reviewed in history tab.   Pertinent Past Surgical History: -Abdominal hysterectomy -Breast lumpectomy -C section -L Parotidectomy 2024 -R total hip arthroplasty 2023  Remainder reviewed in history tab.  Pertinent Social History: Tobacco use: Former (quit in 2017, smoked for ~40 years x1ppd) Alcohol use: None Other Substance use: None Lives with sister, works at [employer]  Pertinent Family History: -Lupus in mother -Parkinson's disease in mother -Breast cancer in sister -Gastric cancer in brother -Heart failure in brother  Remainder reviewed in history tab.   Important Outpatient Medications: -Atorvastatin 40 mg QHS -Citalopram 20 mg daily -Melatonin 5 mg QHS PRN -Hydrochlorothiazide 25 mg -Metformin 500 mg BID -Metoprolol succinate 25 mg daily -Valsartan 80 mg daily -Xarelto 20 mg daily  Remainder reviewed in medication history.   Objective: BP (!) 170/79   Pulse 69   Temp 98.5 F (36.9 C) (Oral)   Resp 11   SpO2 99%   Physical Exam: General: Elderly female, resting comfortably in bed, NAD.  Alert to self, place, situation, time. Eyes: No scleral icterus or mucosal pallor. ENTM: MMM. Neck: No JVD. Cardiovascular: RRR.  Normal S1/2.  No murmur/rub/gallops. Respiratory: Normal work of breathing on room air.  CTAB. Gastrointestinal: Hyperactive bowel sounds.  No tenderness to palpation in all quadrants.  Nondistended. MSK: Strength equal in BUE. Extremities: Cap refill 2-3  seconds. Derm: No rashes grossly. Neuro: No focal neurological deficit.  Concentration and attention normal.  Speech was fluent, comprehension normal. Psych: Full range affect, pleasant, appropriate.  Labs:  CBC BMET  Recent Labs  Lab 06/10/23 1059  WBC 7.3  HGB 12.1  HCT 36.4  PLT 244   Recent Labs  Lab 06/10/23 1059  NA 137  K 2.5*  CL 100  CO2 29  BUN 12  CREATININE 0.97  GLUCOSE 118*  CALCIUM 9.8      Pertinent additional labs: -Trop 9>10 -Magnesium 1.6 -Resp panel: flu/covid/RSV negative  Own interpretation of EKG: NSR.  No ischemic changes.  QTcB 431.   Imaging Studies: -CT head w/o: No acute intracranial processes -CXR: No acute pathology.  Independently reviewed and agree with radiologist's interpretation.  Dimitry Sharion Dove, MD 06/10/2023, 1:53 PM  PGY-1, [facility] FPTS Intern pager: [phone], text pages welcome Secure chat group [facility] Teaching Service  I was personally present and performed or re-performed the history, physical exam and medical decision making activities of this service and have verified that the service and findings are accurately documented in the resident's note.  Shelby Mattocks, DO                  06/10/2023, 5:47 PM

## 2023-06-10 NOTE — ED Triage Notes (Signed)
Patient arrived by [facility] from her work with complaint of feeling foggy today and feeling something not right, no pain, no headache. Vision clear and speech clear. Ems reports CBG 180. Alert and oriented

## 2023-06-10 NOTE — ED Notes (Signed)
Got patient into a gown on the monitor patient is resting with call bell in reach 

## 2023-06-10 NOTE — Assessment & Plan Note (Signed)
Likely secondary to profuse diarrhea and hydrochlorothiazide use. -Admit to FMTS, attending Dr. McDiarmid, Med Tele unit -Continuous cardiac monitoring x24 hours in setting of electrolyte derangements -Orthostatic vitals tomorrow once electrolytes replenished -Encourage PO intake as patient is toleration oral fluids and does not need IVF at this time -KCl PO 40 mEq x 2, already s/p 20 mEq PO and 10 mEq x2 -BMP and magnesium Q6h for 3 occurrences to ensure normalization of electrolytes

## 2023-06-10 NOTE — ED Notes (Signed)
Critical potassium of 2.5 called by main lab

## 2023-06-10 NOTE — Assessment & Plan Note (Signed)
Poorly controlled on HCTZ, valsartan, metoprolol.  Will likely benefit from adding spironolactone for potassium sparing properties and resistant hypertension when discontinuing HCTZ and its potassium wasting effect. -Hold HCTZ 25 mg daily -Continue valsartan 80 mg (irbesartan 75 mg formulary equivalent), metoprolol succinate 25 mg daily

## 2023-06-11 DIAGNOSIS — R197 Diarrhea, unspecified: Secondary | ICD-10-CM

## 2023-06-11 DIAGNOSIS — R590 Localized enlarged lymph nodes: Secondary | ICD-10-CM | POA: Diagnosis present

## 2023-06-11 DIAGNOSIS — D6861 Antiphospholipid syndrome: Secondary | ICD-10-CM | POA: Insufficient documentation

## 2023-06-11 DIAGNOSIS — E876 Hypokalemia: Secondary | ICD-10-CM | POA: Diagnosis not present

## 2023-06-11 DIAGNOSIS — R531 Weakness: Secondary | ICD-10-CM

## 2023-06-11 DIAGNOSIS — R918 Other nonspecific abnormal finding of lung field: Secondary | ICD-10-CM | POA: Diagnosis present

## 2023-06-11 LAB — BASIC METABOLIC PANEL
Anion gap: 6 (ref 5–15)
Anion gap: 9 (ref 5–15)
Anion gap: 9 (ref 5–15)
BUN: 10 mg/dL (ref 8–23)
BUN: 11 mg/dL (ref 8–23)
BUN: 12 mg/dL (ref 8–23)
CO2: 26 mmol/L (ref 22–32)
CO2: 28 mmol/L (ref 22–32)
CO2: 30 mmol/L (ref 22–32)
Calcium: 8.9 mg/dL (ref 8.9–10.3)
Calcium: 9 mg/dL (ref 8.9–10.3)
Calcium: 9.3 mg/dL (ref 8.9–10.3)
Chloride: 104 mmol/L (ref 98–111)
Chloride: 105 mmol/L (ref 98–111)
Chloride: 106 mmol/L (ref 98–111)
Creatinine, Ser: 0.83 mg/dL (ref 0.44–1.00)
Creatinine, Ser: 0.86 mg/dL (ref 0.44–1.00)
Creatinine, Ser: 0.96 mg/dL (ref 0.44–1.00)
GFR, Estimated: >60 mL/min (ref >60)
GFR, Estimated: >60 mL/min (ref >60)
GFR, Estimated: >60 mL/min (ref >60)
Glucose, Bld: 128 mg/dL — ABNORMAL HIGH (ref 70–99)
Glucose, Bld: 133 mg/dL — ABNORMAL HIGH (ref 70–99)
Glucose, Bld: 134 mg/dL — ABNORMAL HIGH (ref 70–99)
Potassium: 3.5 mmol/L (ref 3.5–5.1)
Potassium: 3.6 mmol/L (ref 3.5–5.1)
Potassium: 3.7 mmol/L (ref 3.5–5.1)
Sodium: 140 mmol/L (ref 135–145)
Sodium: 141 mmol/L (ref 135–145)
Sodium: 142 mmol/L (ref 135–145)

## 2023-06-11 LAB — MAGNESIUM
Magnesium: 1.9 mg/dL (ref 1.7–2.4)
Magnesium: 2.1 mg/dL (ref 1.7–2.4)

## 2023-06-11 MED ORDER — POTASSIUM CHLORIDE 20 MEQ PO PACK
20.0000 meq | PACK | Freq: Once | ORAL | Status: AC
  Administered 2023-06-11: 20 meq via ORAL
  Filled 2023-06-11: qty 1

## 2023-06-11 NOTE — Discharge Instructions (Addendum)
Dear [name],   Thank you so much for allowing [place] to be part of your care!  You were admitted to [facility] for confusion and dizziness. You were found to have low potassium and magnesium. This could be for a variety of reasons. One cause could be your chronic diarrhea. We stopped two of your medications metformin (because it can worsen diarrhea) and hydrochlorothiazide, H.C.T.Z., (as this can cause low potassium). Your potassium and magnesium were normal on discharge. We recommended your primary care doctor start spironolactone in the future if you need more blood pressure control.    POST-HOSPITAL & CARE INSTRUCTIONS Please follow up with your primary care doctor  Please let PCP/Specialists know of any changes that were made.  Please see medications section of this packet for any medication changes.   DOCTOR'S APPOINTMENT & FOLLOW UP CARE INSTRUCTIONS  Future Appointments  Date Time Provider Department Center  08/26/2023  2:45 PM Serena Croissant, MD [facility] None  1/3 Appointment with your primary care doctor.   RETURN PRECAUTIONS: If you start to feel confused, short of breath, or dizzy please immediately come back to the emergency department.   Take care and be well!  Family Medicine Teaching Service  [place]  [facility]  [address] [place], [place] [address] [phone]

## 2023-06-11 NOTE — Plan of Care (Signed)

## 2023-06-11 NOTE — Hospital Course (Addendum)
[name] is a 67 y.o.female with a history of IBS-D, HTN, prediabetes who was admitted to the [facility] Medicine Teaching Service at [facility] for near syncope and found to have hypokalemia. Her hospital course is detailed below:  Hypokalemia Presented with acute generalized weakness found to be hypokalemic and hypomagnesemic. A similar situation has happened to patient in the past. Initial potassium was 2.5.  Patient was aggressively repleted with p.o. and IV potassium.  She did not have any EKG changes during this time. Hydrochlorothiazide and metformin were discontinued. Patient had a normal potassium and magnesium on day of discharge.   Other chronic conditions were medically managed with home medications and formulary alternatives as necessary (HLD, GAD)  PCP Follow-up Recommendations: Follow up BMP with Mag for Potassium level  Follow up aldosterone + renin ratio  Consider GI referral for chronic diarrhea  Hydrochlorothiazide was discontinued during admission. Irbesartan and metoprolol were continued. Consider adding spironolactone if hypertension continues to be uncontrolled.  Metformin was discontinued during admission due to diarrhea causing hypokalemia. Consider alternative agent for prediabetes.  Adrenal adenoma on CTA in 11/2022, New hilar lymphadenopathy as well as nodule in left lower lobe, recommended CT in 3-6 months from June. Recommend CT ordered at follow up

## 2023-06-11 NOTE — Plan of Care (Signed)

## 2023-06-11 NOTE — Plan of Care (Signed)
  Problem: Education: Goal: Knowledge of General Education information will improve Description: Including pain rating scale, medication(s)/side effects and non-pharmacologic comfort measures 06/11/2023 1618 by Olena Heckle, LPN Outcome: Adequate for Discharge 06/11/2023 1407 by Olena Heckle, LPN Outcome: Adequate for Discharge   Problem: Health Behavior/Discharge Planning: Goal: Ability to manage health-related needs will improve 06/11/2023 1618 by Olena Heckle, LPN Outcome: Adequate for Discharge 06/11/2023 1407 by Olena Heckle, LPN Outcome: Adequate for Discharge   Problem: Clinical Measurements: Goal: Ability to maintain clinical measurements within normal limits will improve 06/11/2023 1618 by Olena Heckle, LPN Outcome: Adequate for Discharge 06/11/2023 1407 by Olena Heckle, LPN Outcome: Adequate for Discharge Goal: Will remain free from infection 06/11/2023 1618 by Olena Heckle, LPN Outcome: Adequate for Discharge 06/11/2023 1407 by Olena Heckle, LPN Outcome: Adequate for Discharge Goal: Diagnostic test results will improve 06/11/2023 1618 by Olena Heckle, LPN Outcome: Adequate for Discharge 06/11/2023 1407 by Olena Heckle, LPN Outcome: Adequate for Discharge Goal: Respiratory complications will improve 06/11/2023 1618 by Olena Heckle, LPN Outcome: Adequate for Discharge 06/11/2023 1407 by Olena Heckle, LPN Outcome: Adequate for Discharge Goal: Cardiovascular complication will be avoided 06/11/2023 1618 by Olena Heckle, LPN Outcome: Adequate for Discharge 06/11/2023 1407 by Olena Heckle, LPN Outcome: Adequate for Discharge   Problem: Activity: Goal: Risk for activity intolerance will decrease 06/11/2023 1618 by Olena Heckle, LPN Outcome: Adequate for Discharge 06/11/2023 1407 by Olena Heckle, LPN Outcome: Adequate for Discharge   Problem: Nutrition: Goal: Adequate nutrition will be maintained 06/11/2023 1618 by Olena Heckle,  LPN Outcome: Adequate for Discharge 06/11/2023 1407 by Olena Heckle, LPN Outcome: Adequate for Discharge   Problem: Coping: Goal: Level of anxiety will decrease 06/11/2023 1618 by Olena Heckle, LPN Outcome: Adequate for Discharge 06/11/2023 1407 by Olena Heckle, LPN Outcome: Adequate for Discharge   Problem: Elimination: Goal: Will not experience complications related to bowel motility 06/11/2023 1618 by Olena Heckle, LPN Outcome: Adequate for Discharge 06/11/2023 1407 by Olena Heckle, LPN Outcome: Adequate for Discharge Goal: Will not experience complications related to urinary retention 06/11/2023 1618 by Olena Heckle, LPN Outcome: Adequate for Discharge 06/11/2023 1407 by Olena Heckle, LPN Outcome: Adequate for Discharge   Problem: Pain Management: Goal: General experience of comfort will improve 06/11/2023 1618 by Olena Heckle, LPN Outcome: Adequate for Discharge 06/11/2023 1407 by Olena Heckle, LPN Outcome: Adequate for Discharge   Problem: Safety: Goal: Ability to remain free from injury will improve 06/11/2023 1618 by Olena Heckle, LPN Outcome: Adequate for Discharge 06/11/2023 1407 by Olena Heckle, LPN Outcome: Adequate for Discharge   Problem: Skin Integrity: Goal: Risk for impaired skin integrity will decrease 06/11/2023 1618 by Olena Heckle, LPN Outcome: Adequate for Discharge 06/11/2023 1407 by Olena Heckle, LPN Outcome: Adequate for Discharge

## 2023-06-11 NOTE — Discharge Summary (Signed)
Family Medicine Teaching [facility] Discharge Summary  Patient name: [name] Medical record number: [id number] Date of birth: 04-15-1956 Age: 67 y.o. Gender: female Date of Admission: 06/10/2023  Date of Discharge: 06/11/23  Admitting Physician: Nelia Shi, MD  Primary Care Provider: Rometta Emery, MD Consultants: None   Indication for Hospitalization: Hypokalemia and near syncope   Discharge Diagnoses/Problem List:  Principal Problem for Admission: Near syncope Other Problems addressed during stay:  Principal Problem:   Hypokalemia Active Problems:   History of recurrent deep vein thrombosis (DVT)   Hypertension   Controlled type 2 diabetes mellitus without complication, without long-term current use of insulin (HCC)   Diarrhea   Pulmonary nodules   Hilar lymphadenopathy    Brief Hospital Course:  [name] is a 67 y.o.female with a history of IBS-D, HTN, prediabetes who was admitted to the [facility] Medicine Teaching Service at [facility] for near syncope and found to have hypokalemia. Her hospital course is detailed below:  Hypokalemia Presented with acute generalized weakness found to be hypokalemic and hypomagnesemic. A similar situation has happened to patient in the past. Initial potassium was 2.5.  Patient was aggressively repleted with p.o. and IV potassium.  She did not have any EKG changes during this time. Hydrochlorothiazide and metformin were discontinued. Patient had a normal potassium and magnesium on day of discharge.   Other chronic conditions were medically managed with home medications and formulary alternatives as necessary (HLD, GAD)  PCP Follow-up Recommendations: Follow up BMP with Mag for Potassium level  Follow up aldosterone + renin ratio  Consider GI referral for chronic diarrhea  Hydrochlorothiazide was discontinued during admission. Irbesartan and metoprolol were continued. Consider adding spironolactone if hypertension  continues to be uncontrolled.  Metformin was discontinued during admission due to diarrhea causing hypokalemia. Consider alternative agent for prediabetes.  Adrenal adenoma on CTA in 11/2022, New hilar lymphadenopathy as well as nodule in left lower lobe, recommended CT in 3-6 months from June. Recommend CT ordered at follow up    Disposition: Home   Discharge Condition: Stable   Discharge Exam:  Vitals:   06/11/23 1329 06/11/23 1521  BP: (!) 141/57 (!) 126/54  Pulse: 77 77  Resp: 19 18  Temp: 97.7 F (36.5 C) 98.1 F (36.7 C)  SpO2: 95% 95%   General: well appearing, in no acute distress, able to stand and walk to the bathroom without assistance  CV: RRR, radial pulses equal and palpable, no BLE edema  Resp: Normal work of breathing on room air, CTAB Abd: Soft, non tender, non distended  Neuro: Alert & Oriented x 4, pleasant and conversant     Significant Procedures:  None  Significant Labs and Imaging:  Recent Labs  Lab 06/10/23 1059  WBC 7.3  HGB 12.1  HCT 36.4  PLT 244   Recent Labs  Lab 06/10/23 1059 06/10/23 1800 06/11/23 0008 06/11/23 0238 06/11/23 0942  NA 137 140 141 140 142  K 2.5* 2.7* 3.5 3.6 3.7  CL 100 101 104 105 106  CO2 29 29 28 26 30   GLUCOSE 118* 164* 128* 134* 133*  BUN 12 11 12 11 10   CREATININE 0.97 0.89 0.83 0.86 0.96  CALCIUM 9.8 9.4 9.3 8.9 9.0  MG 1.6* 2.3 2.1 1.9  --   ALKPHOS 85  --   --   --   --   AST 18  --   --   --   --   ALT 17  --   --   --   --  ALBUMIN 3.5  --   --   --   --     06/10/2023 CT WO Contrast  1. Stable head CT, no acute intracranial process.   06/10/2023 Chest 2 View  No acute cardiopulmonary findings.   Results/Tests Pending at Time of Discharge: Renin/aldosterone ratio   Discharge Medications:  Allergies as of 06/11/2023       Reactions   Penicillins Shortness Of Breath, Swelling, Other (See Comments)   Tongue swelling   Shellfish-derived Products Shortness Of Breath, Swelling   Tongue  swelling   Morphine And Codeine Itching, Other (See Comments)   Hallucinations         Medication List     STOP taking these medications    hydrochlorothiazide 25 MG tablet Commonly known as: HYDRODIURIL   metFORMIN 500 MG tablet Commonly known as: GLUCOPHAGE       TAKE these medications    acetaminophen 650 MG CR tablet Commonly known as: TYLENOL Take 650-1,300 mg by mouth every 8 (eight) hours as needed for pain.   atorvastatin 20 MG tablet Commonly known as: LIPITOR Take 40 mg by mouth at bedtime.   citalopram 20 MG tablet Commonly known as: CELEXA Take 1 tablet (20 mg total) by mouth daily.   melatonin 5 MG Tabs Take 5 mg by mouth at bedtime as needed (sleep).   metoprolol succinate 25 MG 24 hr tablet Commonly known as: TOPROL-XL Take 25 mg by mouth daily.   valsartan 80 MG tablet Commonly known as: Diovan Take 1 tablet (80 mg total) by mouth daily. What changed: when to take this   Xarelto 20 MG Tabs tablet Generic drug: rivaroxaban TAKE 1 TABLET BY MOUTH ONCE DAILY WITH SUPPER        Discharge Instructions: Please refer to Patient Instructions section of EMR for full details.  Patient was counseled important signs and symptoms that should prompt return to medical care, changes in medications, dietary instructions, activity restrictions, and follow up appointments.   Follow-Up Appointments:  Future Appointments  Date Time Provider Department Center  08/26/2023  2:45 PM Serena Croissant, MD [facility] None  1/3 Appointment with PCP    Lockie Mola, MD 06/11/2023, 3:42 PM PGY-2, [facility]

## 2023-06-20 LAB — ALDOSTERONE + RENIN ACTIVITY W/ RATIO
ALDO / PRA Ratio: 4.8 (ref 0.0–30.0)
Aldosterone: 2 ng/dL (ref 0.0–30.0)
PRA LC/MS/MS: 0.42 ng/mL/h (ref 0.167–5.380)

## 2023-08-22 ENCOUNTER — Other Ambulatory Visit: Payer: Self-pay | Admitting: Hematology and Oncology

## 2023-08-22 NOTE — Telephone Encounter (Signed)
 Pt discharged from our practice and needs to obtain refill from PCP

## 2023-08-26 ENCOUNTER — Inpatient Hospital Stay: Payer: BC Managed Care – PPO | Attending: Hematology and Oncology | Admitting: Hematology and Oncology

## 2023-08-26 VITALS — BP 158/75 | HR 76 | Temp 98.2°F | Resp 20 | Ht 63.0 in | Wt 183.7 lb

## 2023-08-26 DIAGNOSIS — I82401 Acute embolism and thrombosis of unspecified deep veins of right lower extremity: Secondary | ICD-10-CM

## 2023-08-26 DIAGNOSIS — Z86718 Personal history of other venous thrombosis and embolism: Secondary | ICD-10-CM | POA: Insufficient documentation

## 2023-08-26 DIAGNOSIS — Z7901 Long term (current) use of anticoagulants: Secondary | ICD-10-CM | POA: Insufficient documentation

## 2023-08-26 MED ORDER — RIVAROXABAN 20 MG PO TABS: 20.0000 mg | ORAL_TABLET | Freq: Every day | ORAL | 3 refills | Status: AC

## 2023-08-26 NOTE — Assessment & Plan Note (Signed)
 First DVT 2018 status post knee surgery Acute DVT of the right lower extremity. She is referred by [facility]. She presented to her PCP for right leg pain and had a history of DVT. [place] on 08/04/20 showed acute superficial vein thrombosis involving the right great saphenous vein. [place] on Sep 03, 2020 showed a thrombus involving the great saphenous vein at the saphofemoral junction, extending slightly into the common femoral vein.    Current Treatment: Xarelto started on 09-03-20. Grandmother died of blood clots, her mother and her 5 sisters all had blood clots   Hypercoagulability work-up:  Negative blood (factor V Leiden, prothrombin gene mutation, protein C, protein S, Antithrombin III: All normal) Antiphospholipid antibody positive by repeat testing 3 months apart.   My recommendation is indefinite anticoagulation.   Right hip replacement surgery: She has recovered very well from the surgery. Hospitalization  09/04/2022-09/05/2022: Syncope 06/10/2023-06/11/2023: Hypokalemia near syncope  Telephone visit in 1 year for follow-up

## 2023-08-26 NOTE — Progress Notes (Signed)
 Patient Care Team: Rometta Emery, MD as PCP - General (Internal Medicine)  DIAGNOSIS:  Encounter Diagnosis  Name Primary?   Acute thromboembolism of deep veins of right lower extremity (HCC) Yes    CHIEF COMPLIANT:   HISTORY OF PRESENT ILLNESS: Discussed the use of AI scribe software for clinical note transcription with the patient, who gave verbal consent to proceed.  History of Present Illness The patient, with a history of recurrent episodes of severe hypokalemia leading to loss of consciousness, reports doing much better since the last episode in December. She has had two such episodes, the most recent of which was definitively attributed to low potassium levels. She is currently on blood thinners, which she tolerates well, despite experiencing cold intolerance as a side effect.  The patient also has a history of hip replacement surgery approximately two years ago. She reports that the surgery was successful, although she continues to experience some residual pain. She is currently on Xarelto for anticoagulation, which she has been able to obtain at no cost. She has not had any issues with obtaining the medication.     ALLERGIES:  is allergic to penicillins, shellfish-derived products, and morphine and codeine.  MEDICATIONS:  Current Outpatient Medications  Medication Sig Dispense Refill   atorvastatin (LIPITOR) 20 MG tablet Take 40 mg by mouth at bedtime.     citalopram (CELEXA) 20 MG tablet Take 1 tablet (20 mg total) by mouth daily. 30 tablet 3   magnesium (MAGTAB) 84 MG ( ) TBCR SR tablet Take by mouth. Pt doesn't know the correct dosage.     melatonin 5 MG TABS Take 5 mg by mouth at bedtime as needed (sleep).     metoprolol succinate (TOPROL-XL) 25 MG 24 hr tablet Take 25 mg by mouth daily.     valsartan (DIOVAN) 80 MG tablet Take 1 tablet (80 mg total) by mouth daily. (Patient taking differently: Take 80 mg by mouth at bedtime.) 90 tablet 3   XARELTO 20 MG TABS  tablet TAKE 1 TABLET BY MOUTH ONCE DAILY WITH SUPPER 90 tablet 0   acetaminophen (TYLENOL) 650 MG CR tablet Take 650-1,300 mg by mouth every 8 (eight) hours as needed for pain. (Patient not taking: Reported on 08/26/2023)     No current facility-administered medications for this visit.    PHYSICAL EXAMINATION: ECOG PERFORMANCE STATUS: 1 - Symptomatic but completely ambulatory  Vitals:   08/26/23 1455  BP: (!) 158/75  Pulse: 76  Resp: 20  Temp: 98.2 F (36.8 C)  SpO2: 97%   Filed Weights   08/26/23 1455  Weight: 183 lb 11.2 oz (83.3 kg)    Physical Exam   (exam performed in the presence of a chaperone)  LABORATORY DATA:  I have reviewed the data as listed    Latest Ref Rng & Units 06/11/2023    9:42 AM 06/11/2023    2:38 AM 06/11/2023   12:08 AM  CMP  Glucose 70 - 99 mg/dL 409  811  914   BUN 8 - 23 mg/dL 10  11  12    Creatinine 0.44 - 1.00 mg/dL 7.82  9.56  2.13   Sodium 135 - 145 mmol/L 142  140  141   Potassium 3.5 - 5.1 mmol/L 3.7  3.6  3.5   Chloride 98 - 111 mmol/L 106  105  104   CO2 22 - 32 mmol/L 30  26  28    Calcium 8.9 - 10.3 mg/dL 9.0  8.9  9.3  Lab Results  Component Value Date   WBC 7.3 06/10/2023   HGB 12.1 06/10/2023   HCT 36.4 06/10/2023   MCV 86.3 06/10/2023   PLT 244 06/10/2023   NEUTROABS 5.4 06/10/2023    ASSESSMENT & PLAN:  Acute thromboembolism of deep veins of right lower extremity (HCC) First DVT 2018 status post knee surgery Acute DVT of the right lower extremity. She is referred by [facility]. She presented to her PCP for right leg pain and had a history of DVT. [place] on 08/04/20 showed acute superficial vein thrombosis involving the right great saphenous vein. [place] on 09/04/2020 showed a thrombus involving the great saphenous vein at the saphofemoral junction, extending slightly into the common femoral vein.    Current Treatment: Xarelto started on 04-Sep-2020. Grandmother died of blood clots, her mother and her 5 sisters all had  blood clots   Hypercoagulability work-up:  Negative blood (factor V Leiden, prothrombin gene mutation, protein C, protein S, Antithrombin III: All normal) Antiphospholipid antibody positive by repeat testing 3 months apart.   My recommendation is indefinite anticoagulation.   Right hip replacement surgery: She has recovered very well from the surgery. Hospitalization  09/04/2022-09/05/2022: Syncope 06/10/2023-06/11/2023: Hypokalemia near syncope  Telephone visit in 1 year for follow-up ------------------------------------- Assessment and Plan Assessment & Plan Hypokalemia History of severe hypokalemia leading to loss of consciousness. No recent episodes. -Continue current management.  Anticoagulation On Xarelto for an unspecified condition. No recent bleeding or clotting issues. Patient tolerating medication well. -Continue Xarelto indefinitely. -Sent a year's worth of refills to [facility] pharmacy. -Plan to touch base once a year to assess tolerance and any bleeding/clotting issues. -If a blood clot occurs, patient to contact office immediately.  Hip Replacement Two years post-operation. Some residual pain but overall good function. -Continue current management.  General Health Maintenance / Followup Plans -Annual phone check-in to assess tolerance of Xarelto and any bleeding/clotting issues. -If another surgery is needed, plan to switch to injectable anticoagulant temporarily.      No orders of the defined types were placed in this encounter.  The patient has a good understanding of the overall plan. she agrees with it. she will call with any problems that may develop before the next visit here. Total time spent: 30 mins including face to face time and time spent for planning, charting and co-ordination of care   Tamsen Meek, MD 08/26/23

## 2024-05-23 ENCOUNTER — Emergency Department (HOSPITAL_BASED_OUTPATIENT_CLINIC_OR_DEPARTMENT_OTHER)
Admission: EM | Admit: 2024-05-23 | Discharge: 2024-05-23 | Disposition: A | Discharge status: Home / Self Care | Attending: Emergency Medicine | Admitting: Emergency Medicine

## 2024-05-23 ENCOUNTER — Encounter (HOSPITAL_BASED_OUTPATIENT_CLINIC_OR_DEPARTMENT_OTHER): Payer: Self-pay | Admitting: Emergency Medicine

## 2024-05-23 ENCOUNTER — Emergency Department (HOSPITAL_BASED_OUTPATIENT_CLINIC_OR_DEPARTMENT_OTHER)

## 2024-05-23 DIAGNOSIS — E119 Type 2 diabetes mellitus without complications: Secondary | ICD-10-CM | POA: Insufficient documentation

## 2024-05-23 DIAGNOSIS — Z96641 Presence of right artificial hip joint: Secondary | ICD-10-CM | POA: Insufficient documentation

## 2024-05-23 DIAGNOSIS — M25551 Pain in right hip: Secondary | ICD-10-CM | POA: Insufficient documentation

## 2024-05-23 DIAGNOSIS — Z7901 Long term (current) use of anticoagulants: Secondary | ICD-10-CM | POA: Insufficient documentation

## 2024-05-23 DIAGNOSIS — M79651 Pain in right thigh: Secondary | ICD-10-CM | POA: Insufficient documentation

## 2024-05-23 MED ORDER — OXYCODONE-ACETAMINOPHEN 5-325 MG PO TABS: 1.0000 | ORAL_TABLET | Freq: Three times a day (TID) | ORAL | 0 refills | Status: AC | PRN

## 2024-05-23 MED ORDER — NAPROXEN 375 MG PO TABS: 375.0000 mg | ORAL_TABLET | Freq: Two times a day (BID) | ORAL | 0 refills | Status: DC

## 2024-05-23 MED ORDER — METOPROLOL SUCCINATE ER 25 MG PO TB24
25.0000 mg | ORAL_TABLET | Freq: Every day | ORAL | Status: DC
  Administered 2024-05-23: 25 mg via ORAL
  Filled 2024-05-23: qty 1

## 2024-05-23 MED ORDER — ACETAMINOPHEN 500 MG PO TABS: 500.0000 mg | ORAL_TABLET | Freq: Four times a day (QID) | ORAL | 0 refills | Status: AC | PRN

## 2024-05-23 MED ORDER — NAPROXEN 375 MG PO TABS: 375.0000 mg | ORAL_TABLET | Freq: Two times a day (BID) | ORAL | 0 refills | Status: AC | PRN

## 2024-05-23 MED ORDER — OXYCODONE-ACETAMINOPHEN 5-325 MG PO TABS
1.0000 | ORAL_TABLET | Freq: Once | ORAL | Status: AC
  Administered 2024-05-23: 1 via ORAL
  Filled 2024-05-23: qty 1

## 2024-05-23 NOTE — ED Triage Notes (Signed)
 Pt caox4 ambulatory c/o R hip and R thigh worsening x2 days. Pt reports she had hip replacement on same side approx 2 yrs ago and this is the worst pain she has had since. Denies any recent trauma/injury. Last took Tylenol  approx 0430 this morning.

## 2024-05-23 NOTE — ED Notes (Signed)
 D.P. pulse on right is 1+. D.P. pulse on left is 2 +. All toes CMS intact bilat. With immediate cap. Refill.

## 2024-05-23 NOTE — ED Provider Notes (Addendum)
 [place] [facility] AT [facility] Provider Note   CSN: [id number] Arrival date & time: 05/23/24  9287     Patient presents with: Leg Pain   [name] is a 69 y.o. female.   HPI     68 year old female comes in with chief complaint of leg pain.  Patient has past medical history of diabetes, DVT on Xarelto  and right-sided hip replacement.  Patient reports that she started noticing some pain over her right hip and right thigh about 2 days ago.  The pain has gradually worsened.  This morning, when she was in the shower, she started experiencing worsening pain.  She went to have breakfast, and then the pain intensified to the point where she is having difficulty walking right now.  Pain is worse with any activity.  She denies any new calf pain or swelling.  Patient works at [employer] and walks a fair amount of distance.  She has no history of similar pain.  She denies any trauma that could have explain the pain.  Patient denies any back pain, numbness or tingling.  Prior to Admission medications   Medication Sig Start Date End Date Taking? Authorizing Provider  acetaminophen  (TYLENOL ) 500 MG tablet Take 1 tablet (500 mg total) by mouth every 6 (six) hours as needed. 05/23/24  Yes Charlyn Sora, MD  oxyCODONE -acetaminophen  (PERCOCET/ROXICET) 5-325 MG tablet Take 1 tablet by mouth every 8 (eight) hours as needed for severe pain (pain score 7-10). 05/23/24  Yes Kmari Brian, Sora, MD  atorvastatin  (LIPITOR) 20 MG tablet Take 40 mg by mouth at bedtime.    [provider]  citalopram  (CELEXA ) 20 MG tablet Take 1 tablet (20 mg total) by mouth daily. 07/26/20   Just, Kelsea J, FNP  magnesium  (MAGTAB) 84 MG ( ) TBCR SR tablet Take by mouth. Pt doesn't know the correct dosage.    [provider]  melatonin 5 MG TABS Take 5 mg by mouth at bedtime as needed (sleep).    [provider]  metoprolol  succinate (TOPROL -XL) 25 MG 24 hr tablet Take 25  mg by mouth daily. 06/22/20   [provider]  naproxen  (NAPROSYN ) 375 MG tablet Take 1 tablet (375 mg total) by mouth 2 (two) times daily as needed. 05/23/24   Charlyn Sora, MD  rivaroxaban  (XARELTO ) 20 MG TABS tablet Take 1 tablet (20 mg total) by mouth daily with supper. 08/26/23   Gudena, Vinay, MD  valsartan  (DIOVAN ) 80 MG tablet Take 1 tablet (80 mg total) by mouth daily. Patient taking differently: Take 80 mg by mouth at bedtime. 07/26/20   Just, Kelsea J, FNP    Allergies: Penicillins, Shellfish protein-containing drug products, and Morphine and codeine     Review of Systems  All other systems reviewed and are negative.   Updated Vital Signs BP 129/72 (BP Location: Left Arm)   Pulse 68   Temp 97.8 F (36.6 C) (Oral)   Resp 18   Ht 5' 3 (1.6 m)   Wt 79.4 kg   SpO2 99%   BMI 31.00 kg/m   Physical Exam Vitals and nursing note reviewed.  Constitutional:      Appearance: She is well-developed.  HENT:     Head: Atraumatic.  Cardiovascular:     Rate and Rhythm: Normal rate.  Pulmonary:     Effort: Pulmonary effort is normal.  Musculoskeletal:     Cervical back: Normal range of motion and neck supple.     Comments: Patient has mild  tenderness over the right hip, but the tenderness worsens over the right proximal femur. There is no calf tenderness or unilateral calf swelling  Patient able to tolerate internal rotation and external rotation, but the pain is worse with external rotation.  Patient able to tolerate passive leg raise, but has significant discomfort with active leg raise  Skin:    General: Skin is warm and dry.  Neurological:     Mental Status: She is alert and oriented to person, place, and time.     (all labs ordered are listed, but only abnormal results are displayed) Labs Reviewed - No data to display  EKG: None  Radiology: DG Femur Min 2 Views Right Result Date: 05/23/2024 CLINICAL DATA:  Pain. EXAM: RIGHT FEMUR 2 VIEWS COMPARISON:   11/21/2022 FINDINGS: Right total hip arthroplasty. No evidence for periprosthetic fracture. Right hip is located. Right femur is intact without a fracture. Degenerative changes at the symphysis pubis. Right knee is located. Mild spurring and degenerative changes at the patellofemoral compartment of the knee. No evidence for a knee joint effusion. IMPRESSION: Right hip arthroplasty without acute abnormality. Electronically Signed   By: Juliene Balder M.D.   On: 05/23/2024 10:43     Procedures   Medications Ordered in the ED  metoprolol  succinate (TOPROL -XL) 24 hr tablet 25 mg (25 mg Oral Given 05/23/24 0941)  oxyCODONE -acetaminophen  (PERCOCET/ROXICET) 5-325 MG per tablet 1 tablet (1 tablet Oral Given 05/23/24 0941)    Clinical Course as of 05/23/24 1250  Sat May 23, 2024  1249 Upon reviewing patient's chart, it appears that the DVT was because of hypercoagulability syndrome.  I think it would be best to get ultrasound DVT done prior to orthopedic evaluation given that there is no traumatic episode either.  I called patient at her listed mobile number starting with [phone], and left a HIPAA compliant message indicating that we will get outpatient ultrasound DVT ordered and that our team will reach out to her to get the study scheduled.   [AN]    Clinical Course User Index [AN] Charlyn Sora, MD                                 Medical Decision Making Amount and/or Complexity of Data Reviewed Radiology: ordered.  Risk OTC drugs. Prescription drug management.  68 year old patient comes in with chief complaint of severe right hip pain. On my exam, patient has no evidence of significant swelling, she is able to tolerate passive range of motion and has pain primarily over the right proximal thigh.  Patient has previous history of right hip replacement.  She has previous history of DVT, she is on Xarelto .   Differential considered for this patient includes hip fracture, periprosthetic fracture,  soft tissue injury, hematoma, tendinitis.  Other possibilities considered includes DVT of the lower extremity, but patient does not have any signs of DVT in the calf area and she is taking Xarelto .  Patient also does not have any signs of arterial insufficiency, as the skin is warm to touch.  I will get x-ray of the hip, give patient some pain medication and ambulate her.  If she is able to walk, and the x-rays are normal, then we will discharge her with close follow-up with orthopedist.  If patient is unable to bear weight, then we will expand the workup, get labs and CT imaging, plus or minus DVT study  Reassessment: Patient received oxycodone  along  with x-rays.  X-ray of the hip independently interpreted by me.  There is no evidence of any periprosthetic fracture.  Patient was able to ambulate and felt better.  She still has discomfort.  At this time, my plan is for patient to be put on pain medication and have her follow-up with orthopedist.  I will give her a work note as well.  I have low suspicion for DVT, but she has been advised to come back to the ER if her symptoms get worse, in which case DVT probably needs to be evaluated along with CT to have imaging and labs.  Final diagnoses:  Acute hip pain, right    ED Discharge Orders          Ordered    naproxen  (NAPROSYN ) 375 MG tablet  2 times daily,   Status:  Discontinued        05/23/24 1125    acetaminophen  (TYLENOL ) 500 MG tablet  Every 6 hours PRN        05/23/24 1125    naproxen  (NAPROSYN ) 375 MG tablet  2 times daily,   Status:  Discontinued        05/23/24 1126    naproxen  (NAPROSYN ) 375 MG tablet  2 times daily PRN        05/23/24 1127    oxyCODONE -acetaminophen  (PERCOCET/ROXICET) 5-325 MG tablet  Every 8 hours PRN        05/23/24 1223    Lower Ext Right Venous US        Comments: IMPORTANT PATIENT INSTRUCTIONS:  You have been scheduled for an Outpatient Ultrasound.    Your appointment has been scheduled for:  _______ am/pm  on _______________ (date).  If your appointment is scheduled for a Saturday, Sunday or holiday, please go to the [employer] at [facility] Emergency Department Registration Desk at least 15 minutes prior to your appointment time and tell them you are there for an ultrasound.    If your appointment is scheduled for a weekday (Monday - Friday), please go directly to the [facility] Radiology Department reception area at least 15 minutes prior to your appointment time and tell them you are there for an ultrasound.  Please call [phone] with questions.   05/23/24 1246               Charlyn Sora, MD 05/23/24 1245    Charlyn Sora, MD 05/23/24 1250

## 2024-05-23 NOTE — Discharge Instructions (Addendum)
 You were seen in the emergency room for severe pain to your hip.  The x-ray does not show any fracture.  We were able to have you ambulate, which is reassuring.  At this time, we recommend that finder milligrams Tylenol  every 4-6 hours.  You may take Naprosyn  as needed in addition to Tylenol  if the pain is severe.  We also recommend that you do not overexert yourself, and consult orthopedic surgery for close follow-up.  Return to the emergency room if your pain gets worse, you start having difficulty in ambulating.

## 2024-05-26 ENCOUNTER — Ambulatory Visit: Admitting: Physician Assistant

## 2024-05-28 ENCOUNTER — Ambulatory Visit (HOSPITAL_COMMUNITY)
Admission: RE | Admit: 2024-05-28 | Discharge: 2024-05-28 | Disposition: A | Discharge status: Home / Self Care | Source: Ambulatory Visit | Attending: Physician Assistant | Admitting: Physician Assistant

## 2024-05-28 ENCOUNTER — Ambulatory Visit: Admitting: Physician Assistant

## 2024-05-28 DIAGNOSIS — Z96641 Presence of right artificial hip joint: Secondary | ICD-10-CM | POA: Insufficient documentation

## 2024-05-28 DIAGNOSIS — M25551 Pain in right hip: Secondary | ICD-10-CM

## 2024-05-28 NOTE — Progress Notes (Signed)
 Office Visit Note   Patient: [name]           Date of Birth: January 03, 1956           MRN: [id number] Visit Date: 05/28/2024              Requested by: Sim Emery CROME, MD [address] [place],  [place] [address] PCP: Sim Emery CROME, MD   Assessment & Plan: Visit Diagnoses:  1. Pain in right hip   2. Status post total replacement of right hip     Plan: Impression is acute right hip pain following total hip replacement nearly 3 years ago.  Patient has not experienced any new injury or change in activity.  Her symptoms seem inflammatory in nature.  Hard to tell if there is underlying infection so we will obtain CBC with differential, sed rate and CRP today.  We will call her with the results.  We will also go ahead and send in a steroid pack but patient has been instructed not to start this until she hears back from us  with the results as we do not want her to take this and less inflammatory markers are unremarkable.  We are going to also order an ultrasound right lower extremity to rule out DVT as the cause.  She will call us  with any concerns or questions in meantime.  Follow-Up Instructions: Return if symptoms worsen or fail to improve.   Orders:  Orders Placed This Encounter  Procedures   CBC with Differential/Platelet   Sedimentation rate   C-reactive protein   VAS US  LOWER EXTREMITY VENOUS (DVT)   Meds ordered this encounter  Medications   methylPREDNISolone (MEDROL DOSEPAK) 4 MG TBPK tablet    Sig: Do not take until you have heard from orthopedics regarding your lab work.  If orthopedics tells you it's ok to take, follow directions on the package    Dispense:  21 tablet    Refill:  0      Procedures: No procedures performed   Clinical Data: No additional findings.   Subjective: Chief Complaint  Patient presents with   Right Hip - Pain    HPI patient is a pleasant 68 year old female who comes in today with acute right hip pain.  She is  status post right total hip replacement March 2023.  She was doing great until about a week ago.  While at work, she began having pain to the right groin and into the proximal aspect of the anterior thigh.  A few days later her pain significantly worsened and became unbearable to ambulate.  She was seen in the ED where x-rays were obtained.  X-rays of the femur are unremarkable.  She was given oxycodone  which she took a few pills the first 2 days but is only been taking Tylenol  since.  Her pain has become a little more tolerable but is still fairly significant.  The pain she is having is still to the groin and anterior thigh.  Pain is described as a constant discomfort with significant pain during hip flexion and some pain with ambulating.  She denies any paresthesias or pain going elsewhere down her leg.  She has a type II diabetic and has a history of underlying right lower extremity DVTs and is on Xarelto .  She has not had a recent ultrasound of the right lower extremity.  She denies any fevers or chills or any recent infection, cuts, UTI, dental work, catering manager.  Review of Systems as detailed HPI.  All others reviewed and are negative.   Objective: Vital Signs: There were no vitals taken for this visit.  Physical Exam well-developed well-nourished female in no acute distress.  Alert and oriented x 3.  Ortho Exam right hip exam: No erythema, induration or any evidence of cellulitis.  She does have significant pain with hip flexion and internal rotation of the hip.  No pain with straight leg raise.  Calves are soft nontender.  She is neurovascularly intact distally.  Specialty Comments:  No specialty comments available.  Imaging: No new imaging   PMFS History: Patient Active Problem List   Diagnosis Date Noted   Antiphospholipid antibody with hypercoagulable state 06/11/2023   Pulmonary nodules 06/11/2023   Hilar lymphadenopathy 06/11/2023   Generalized weakness 06/11/2023   Hypokalemia  06/10/2023   Prediabetes 06/10/2023   Diarrhea 06/10/2023   Syncope and collapse 09/04/2022   Hypertension 09/04/2022   Controlled type 2 diabetes mellitus without complication, without long-term current use of insulin  (HCC) 09/04/2022   Dyslipidemia 09/04/2022   Parotid mass 08/20/2022   Primary osteoarthritis of right hip 08/30/2021   Status post total replacement of right hip 08/30/2021   History of recurrent deep vein thrombosis (DVT)    Acute thromboembolism of deep veins of right lower extremity (HCC) 07/26/2020   Depression 07/15/2007   Diffuse connective tissue disease 07/15/2007   WEIGHT LOSS 07/15/2007   Headache 07/15/2007   COLONIC POLYPS, BENIGN 02/19/2001   Past Medical History:  Diagnosis Date   Anxiety    Colitis 08/2001   DVT (deep venous thrombosis) (HCC)    RLE 06/2016, 08/2020   Headache    has tunnel vision when she has a headache   History of blood transfusion    when twins were born.   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   Pneumonia    Pre-diabetes     Family History  Problem Relation Age of Onset   Lupus Mother    Parkinson's disease Mother    Cancer Father    Breast cancer Sister    Cancer Brother    Gastric cancer Brother    Heart failure Brother    Other Maternal Grandfather        Blood clot    Past Surgical History:  Procedure Laterality Date   ABDOMINAL HYSTERECTOMY     BREAST LUMPECTOMY     BUNIONECTOMY     C Section     PAROTIDECTOMY Left 08/20/2022   Procedure: LEFT SUPERFICIAL PAROTIDECTOMY WITH FACIAL NERVE DISSECTION;  Surgeon: Jesus Oliphant, MD;  Location[facility];  Service: ENT;  Laterality: Left;  RNFA   TOTAL HIP ARTHROPLASTY Right 08/30/2021   Procedure: RIGHT TOTAL HIP ARTHROPLASTY ANTERIOR APPROACH;  Surgeon: Jerri Kay HERO, MD;  Location: [facility] OR;  Service: Orthopedics;  Laterality: Right;   Social History   Occupational History   Not on file   Tobacco Use   Smoking status: Former    Current packs/day: 0.00    Average packs/day: 1 pack/day for 42.0 years (42.0 ttl pk-yrs)    Types: Cigarettes    Start date: 07/26/1973    Quit date: 07/27/2015    Years since quitting: 8.8   Smokeless tobacco: Never  Vaping Use   Vaping status: Never Used  Substance and Sexual Activity   Alcohol use: Not Currently    Comment: occasional   Drug  use: No   Sexual activity: Yes    Birth control/protection: Condom

## 2024-05-29 ENCOUNTER — Ambulatory Visit: Payer: Self-pay | Admitting: Physician Assistant

## 2024-05-29 ENCOUNTER — Telehealth: Payer: Self-pay | Admitting: Orthopaedic Surgery

## 2024-05-29 ENCOUNTER — Other Ambulatory Visit: Payer: Self-pay | Admitting: Physician Assistant

## 2024-05-29 LAB — CBC WITH DIFFERENTIAL/PLATELET
Absolute Lymphocytes: 1172 {cells}/uL (ref 850–3900)
Absolute Monocytes: 383 {cells}/uL (ref 200–950)
Basophils Absolute: 29 {cells}/uL (ref 0–200)
Basophils Relative: 0.5 %
Eosinophils Absolute: 52 {cells}/uL (ref 15–500)
Eosinophils Relative: 0.9 %
HCT: 46.3 % — ABNORMAL HIGH (ref 35.9–46.0)
Hemoglobin: 14.9 g/dL (ref 11.7–15.5)
MCH: 28.8 pg (ref 27.0–33.0)
MCHC: 32.2 g/dL (ref 31.6–35.4)
MCV: 89.6 fL (ref 81.4–101.7)
MPV: 11 fL (ref 7.5–12.5)
Monocytes Relative: 6.6 %
Neutro Abs: 4164 {cells}/uL (ref 1500–7800)
Neutrophils Relative %: 71.8 %
Platelets: 243 Thousand/uL (ref 140–400)
RBC: 5.17 Million/uL — ABNORMAL HIGH (ref 3.80–5.10)
RDW: 13.3 % (ref 11.0–15.0)
Total Lymphocyte: 20.2 %
WBC: 5.8 Thousand/uL (ref 3.8–10.8)

## 2024-05-29 LAB — C-REACTIVE PROTEIN: CRP: <3 mg/L (ref <8.0)

## 2024-05-29 LAB — SEDIMENTATION RATE: Sed Rate: 2 mm/h (ref 0–30)

## 2024-05-29 NOTE — Telephone Encounter (Signed)
 Pt called wanting to know if she is supposed to get her steroid pack and when she is supposed to start it. Call back number is [phone]

## 2024-05-29 NOTE — Progress Notes (Signed)
 Spoke to patient about labs.

## 2024-05-29 NOTE — Telephone Encounter (Signed)
 I discussed with her yesterday that once we had the lab work back we would call her and if lab work was normal, she could start the steroid.  I just checked and we don't have labs back yet.  Does she need me to send something in for pain?

## 2024-06-01 NOTE — Progress Notes (Signed)
 I cannot find the impression where it says she was negative.  Can we get this?

## 2024-06-02 NOTE — Progress Notes (Signed)
 Ok, thank you

## 2024-06-02 NOTE — Progress Notes (Signed)
 Message [place] to see who can fix this.

## 2024-06-02 NOTE — Progress Notes (Signed)
 [name] is working to get this fixed. Not sure what happened.

## 2024-08-25 ENCOUNTER — Telehealth: Admitting: Hematology and Oncology
